# Patient Record
Sex: Female | Born: 1935 | Race: White | Hispanic: No | Smoking: Former smoker
Health system: Southern US, Community
[De-identification: ages and names within clinical notes are randomized; demographics above are authoritative.]

## PROBLEM LIST (undated history)

## (undated) DIAGNOSIS — Z9289 Personal history of other medical treatment: Secondary | ICD-10-CM

## (undated) DIAGNOSIS — I2510 Atherosclerotic heart disease of native coronary artery without angina pectoris: Secondary | ICD-10-CM

## (undated) DIAGNOSIS — I358 Other nonrheumatic aortic valve disorders: Secondary | ICD-10-CM

## (undated) DIAGNOSIS — B029 Zoster without complications: Secondary | ICD-10-CM

## (undated) DIAGNOSIS — K219 Gastro-esophageal reflux disease without esophagitis: Secondary | ICD-10-CM

## (undated) DIAGNOSIS — M722 Plantar fascial fibromatosis: Secondary | ICD-10-CM

## (undated) DIAGNOSIS — I10 Essential (primary) hypertension: Secondary | ICD-10-CM

## (undated) DIAGNOSIS — C801 Malignant (primary) neoplasm, unspecified: Secondary | ICD-10-CM

## (undated) DIAGNOSIS — N938 Other specified abnormal uterine and vaginal bleeding: Secondary | ICD-10-CM

## (undated) DIAGNOSIS — E7800 Pure hypercholesterolemia, unspecified: Secondary | ICD-10-CM

## (undated) HISTORY — PX: EYE SURGERY: SHX253

## (undated) HISTORY — DX: Pure hypercholesterolemia, unspecified: E78.00

## (undated) HISTORY — PX: CARPAL TUNNEL RELEASE: SHX101

## (undated) HISTORY — DX: Personal history of other medical treatment: Z92.89

## (undated) HISTORY — DX: Other nonrheumatic aortic valve disorders: I35.8

## (undated) HISTORY — DX: Other specified abnormal uterine and vaginal bleeding: N93.8

## (undated) HISTORY — DX: Malignant (primary) neoplasm, unspecified: C80.1

## (undated) HISTORY — PX: INGUINAL HERNIA REPAIR: SUR1180

## (undated) HISTORY — DX: Zoster without complications: B02.9

---

## 1999-01-08 ENCOUNTER — Encounter: Admission: RE | Admit: 1999-01-08 | Discharge: 1999-01-08 | Payer: Self-pay | Admitting: Endocrinology

## 1999-01-08 ENCOUNTER — Encounter: Payer: Self-pay | Admitting: Endocrinology

## 1999-03-16 HISTORY — PX: VAGINAL HYSTERECTOMY: SUR661

## 1999-03-16 HISTORY — PX: OOPHORECTOMY: SHX86

## 1999-11-02 ENCOUNTER — Encounter: Payer: Self-pay | Admitting: Endocrinology

## 1999-11-02 ENCOUNTER — Encounter: Admission: RE | Admit: 1999-11-02 | Discharge: 1999-11-02 | Payer: Self-pay | Admitting: Endocrinology

## 1999-12-24 ENCOUNTER — Emergency Department (HOSPITAL_COMMUNITY): Admission: EM | Admit: 1999-12-24 | Discharge: 1999-12-24 | Payer: Self-pay | Admitting: Emergency Medicine

## 1999-12-25 ENCOUNTER — Other Ambulatory Visit: Admission: RE | Admit: 1999-12-25 | Discharge: 1999-12-25 | Payer: Self-pay | Admitting: Obstetrics and Gynecology

## 1999-12-25 ENCOUNTER — Encounter (INDEPENDENT_AMBULATORY_CARE_PROVIDER_SITE_OTHER): Payer: Self-pay

## 2000-01-06 ENCOUNTER — Inpatient Hospital Stay (HOSPITAL_COMMUNITY): Admission: RE | Admit: 2000-01-06 | Discharge: 2000-01-08 | Payer: Self-pay | Admitting: Obstetrics and Gynecology

## 2000-01-06 ENCOUNTER — Encounter (INDEPENDENT_AMBULATORY_CARE_PROVIDER_SITE_OTHER): Payer: Self-pay | Admitting: Specialist

## 2000-01-29 ENCOUNTER — Encounter: Admission: RE | Admit: 2000-01-29 | Discharge: 2000-01-29 | Payer: Self-pay | Admitting: Endocrinology

## 2000-01-29 ENCOUNTER — Encounter: Payer: Self-pay | Admitting: Endocrinology

## 2000-02-24 ENCOUNTER — Encounter (INDEPENDENT_AMBULATORY_CARE_PROVIDER_SITE_OTHER): Payer: Self-pay | Admitting: Specialist

## 2000-02-24 ENCOUNTER — Ambulatory Visit (HOSPITAL_COMMUNITY): Admission: RE | Admit: 2000-02-24 | Discharge: 2000-02-25 | Payer: Self-pay | Admitting: *Deleted

## 2000-04-07 ENCOUNTER — Ambulatory Visit (HOSPITAL_BASED_OUTPATIENT_CLINIC_OR_DEPARTMENT_OTHER): Admission: RE | Admit: 2000-04-07 | Discharge: 2000-04-07 | Payer: Self-pay | Admitting: *Deleted

## 2001-03-28 ENCOUNTER — Encounter: Admission: RE | Admit: 2001-03-28 | Discharge: 2001-03-28 | Payer: Self-pay | Admitting: Obstetrics and Gynecology

## 2001-03-28 ENCOUNTER — Encounter: Payer: Self-pay | Admitting: Obstetrics and Gynecology

## 2001-03-29 ENCOUNTER — Ambulatory Visit (HOSPITAL_COMMUNITY): Admission: RE | Admit: 2001-03-29 | Discharge: 2001-03-29 | Payer: Self-pay | Admitting: Endocrinology

## 2001-07-28 ENCOUNTER — Ambulatory Visit (HOSPITAL_COMMUNITY): Admission: RE | Admit: 2001-07-28 | Discharge: 2001-07-28 | Payer: Self-pay | Admitting: Gastroenterology

## 2001-07-28 ENCOUNTER — Encounter (INDEPENDENT_AMBULATORY_CARE_PROVIDER_SITE_OTHER): Payer: Self-pay | Admitting: *Deleted

## 2002-05-04 ENCOUNTER — Ambulatory Visit (HOSPITAL_COMMUNITY): Admission: RE | Admit: 2002-05-04 | Discharge: 2002-05-04 | Payer: Self-pay | Admitting: Ophthalmology

## 2002-06-05 ENCOUNTER — Ambulatory Visit (HOSPITAL_COMMUNITY): Admission: RE | Admit: 2002-06-05 | Discharge: 2002-06-05 | Payer: Self-pay | Admitting: Ophthalmology

## 2002-07-19 ENCOUNTER — Encounter: Admission: RE | Admit: 2002-07-19 | Discharge: 2002-07-19 | Payer: Self-pay | Admitting: Obstetrics and Gynecology

## 2002-07-19 ENCOUNTER — Encounter: Payer: Self-pay | Admitting: Obstetrics and Gynecology

## 2003-08-26 ENCOUNTER — Encounter: Admission: RE | Admit: 2003-08-26 | Discharge: 2003-08-26 | Payer: Self-pay | Admitting: Obstetrics and Gynecology

## 2003-12-03 ENCOUNTER — Ambulatory Visit (HOSPITAL_COMMUNITY): Admission: RE | Admit: 2003-12-03 | Discharge: 2003-12-03 | Payer: Self-pay | Admitting: Endocrinology

## 2004-08-17 ENCOUNTER — Ambulatory Visit (HOSPITAL_COMMUNITY): Admission: RE | Admit: 2004-08-17 | Discharge: 2004-08-17 | Payer: Self-pay | Admitting: Endocrinology

## 2004-08-27 ENCOUNTER — Encounter: Admission: RE | Admit: 2004-08-27 | Discharge: 2004-08-27 | Payer: Self-pay | Admitting: Obstetrics and Gynecology

## 2005-05-25 ENCOUNTER — Encounter: Admission: RE | Admit: 2005-05-25 | Discharge: 2005-05-25 | Payer: Self-pay | Admitting: Orthopedic Surgery

## 2005-05-26 ENCOUNTER — Encounter (INDEPENDENT_AMBULATORY_CARE_PROVIDER_SITE_OTHER): Payer: Self-pay | Admitting: Specialist

## 2005-05-26 ENCOUNTER — Ambulatory Visit (HOSPITAL_BASED_OUTPATIENT_CLINIC_OR_DEPARTMENT_OTHER): Admission: RE | Admit: 2005-05-26 | Discharge: 2005-05-26 | Payer: Self-pay | Admitting: Orthopedic Surgery

## 2005-09-01 ENCOUNTER — Encounter: Admission: RE | Admit: 2005-09-01 | Discharge: 2005-09-01 | Payer: Self-pay | Admitting: Obstetrics and Gynecology

## 2006-03-15 HISTORY — PX: OTHER SURGICAL HISTORY: SHX169

## 2006-09-06 ENCOUNTER — Encounter: Admission: RE | Admit: 2006-09-06 | Discharge: 2006-09-06 | Payer: Self-pay | Admitting: Anesthesiology

## 2006-11-11 ENCOUNTER — Inpatient Hospital Stay (HOSPITAL_COMMUNITY): Admission: AD | Admit: 2006-11-11 | Discharge: 2006-11-12 | Payer: Self-pay | Admitting: Cardiology

## 2006-11-24 ENCOUNTER — Encounter (HOSPITAL_COMMUNITY): Admission: RE | Admit: 2006-11-24 | Discharge: 2007-02-22 | Payer: Self-pay | Admitting: Cardiology

## 2007-02-23 ENCOUNTER — Encounter (HOSPITAL_COMMUNITY): Admission: RE | Admit: 2007-02-23 | Discharge: 2007-03-15 | Payer: Self-pay | Admitting: Cardiology

## 2007-03-16 HISTORY — PX: OTHER SURGICAL HISTORY: SHX169

## 2007-05-29 ENCOUNTER — Other Ambulatory Visit: Admission: RE | Admit: 2007-05-29 | Discharge: 2007-05-29 | Payer: Self-pay | Admitting: Obstetrics and Gynecology

## 2007-09-20 ENCOUNTER — Encounter: Admission: RE | Admit: 2007-09-20 | Discharge: 2007-09-20 | Payer: Self-pay | Admitting: Obstetrics and Gynecology

## 2007-12-01 ENCOUNTER — Encounter: Admission: RE | Admit: 2007-12-01 | Discharge: 2007-12-01 | Payer: Self-pay | Admitting: Orthopedic Surgery

## 2007-12-05 ENCOUNTER — Ambulatory Visit (HOSPITAL_BASED_OUTPATIENT_CLINIC_OR_DEPARTMENT_OTHER): Admission: RE | Admit: 2007-12-05 | Discharge: 2007-12-05 | Payer: Self-pay | Admitting: Orthopedic Surgery

## 2008-05-29 ENCOUNTER — Other Ambulatory Visit: Admission: RE | Admit: 2008-05-29 | Discharge: 2008-05-29 | Payer: Self-pay | Admitting: Obstetrics and Gynecology

## 2008-05-29 ENCOUNTER — Ambulatory Visit: Payer: Self-pay | Admitting: Obstetrics and Gynecology

## 2008-05-29 ENCOUNTER — Encounter: Payer: Self-pay | Admitting: Obstetrics and Gynecology

## 2009-03-15 HISTORY — PX: APPENDECTOMY: SHX54

## 2009-05-30 ENCOUNTER — Other Ambulatory Visit: Admission: RE | Admit: 2009-05-30 | Discharge: 2009-05-30 | Payer: Self-pay | Admitting: Obstetrics and Gynecology

## 2009-05-30 ENCOUNTER — Ambulatory Visit: Payer: Self-pay | Admitting: Obstetrics and Gynecology

## 2009-06-16 ENCOUNTER — Encounter: Admission: RE | Admit: 2009-06-16 | Discharge: 2009-06-16 | Payer: Self-pay | Admitting: Obstetrics and Gynecology

## 2010-02-09 ENCOUNTER — Encounter (INDEPENDENT_AMBULATORY_CARE_PROVIDER_SITE_OTHER): Payer: Self-pay | Admitting: General Surgery

## 2010-02-09 ENCOUNTER — Inpatient Hospital Stay (HOSPITAL_COMMUNITY): Admission: EM | Admit: 2010-02-09 | Discharge: 2010-02-10 | Payer: Self-pay | Admitting: General Surgery

## 2010-02-09 ENCOUNTER — Encounter: Payer: Self-pay | Admitting: Emergency Medicine

## 2010-02-09 ENCOUNTER — Ambulatory Visit: Payer: Self-pay | Admitting: Diagnostic Radiology

## 2010-05-18 ENCOUNTER — Other Ambulatory Visit: Payer: Self-pay | Admitting: Obstetrics and Gynecology

## 2010-05-18 DIAGNOSIS — Z139 Encounter for screening, unspecified: Secondary | ICD-10-CM

## 2010-05-27 LAB — DIFFERENTIAL
Eosinophils Absolute: 0.2 10*3/uL (ref 0.0–0.7)
Eosinophils Relative: 2 % (ref 0–5)
Lymphocytes Relative: 11 % — ABNORMAL LOW (ref 12–46)
Lymphs Abs: 1.4 10*3/uL (ref 0.7–4.0)
Monocytes Relative: 7 % (ref 3–12)
Neutro Abs: 10.1 10*3/uL — ABNORMAL HIGH (ref 1.7–7.7)

## 2010-05-27 LAB — LIPASE, BLOOD: Lipase: 139 U/L (ref 23–300)

## 2010-05-27 LAB — COMPREHENSIVE METABOLIC PANEL
ALT: 19 U/L (ref 0–35)
BUN: 16 mg/dL (ref 6–23)
Chloride: 103 mEq/L (ref 96–112)
GFR calc non Af Amer: >60 mL/min (ref >60)
Sodium: 143 mEq/L (ref 135–145)
Total Protein: 7 g/dL (ref 6.0–8.3)

## 2010-05-27 LAB — URINALYSIS, ROUTINE W REFLEX MICROSCOPIC
Bilirubin Urine: NEGATIVE
Hgb urine dipstick: NEGATIVE
Nitrite: NEGATIVE
pH: 5.5 (ref 5.0–8.0)

## 2010-05-27 LAB — CBC
HCT: 35.8 % — ABNORMAL LOW (ref 36.0–46.0)
Hemoglobin: 12.1 g/dL (ref 12.0–15.0)
Platelets: 188 10*3/uL (ref 150–400)
RDW: 13.4 % (ref 11.5–15.5)
WBC: 12.6 10*3/uL — ABNORMAL HIGH (ref 4.0–10.5)

## 2010-06-01 ENCOUNTER — Encounter (INDEPENDENT_AMBULATORY_CARE_PROVIDER_SITE_OTHER): Payer: Medicare Other | Admitting: Obstetrics and Gynecology

## 2010-06-01 ENCOUNTER — Other Ambulatory Visit: Payer: Self-pay | Admitting: Obstetrics and Gynecology

## 2010-06-01 ENCOUNTER — Other Ambulatory Visit (HOSPITAL_COMMUNITY)
Admission: RE | Admit: 2010-06-01 | Discharge: 2010-06-01 | Disposition: A | Discharge status: Home / Self Care | Payer: Medicare Other | Source: Ambulatory Visit | Attending: Obstetrics and Gynecology | Admitting: Obstetrics and Gynecology

## 2010-06-01 DIAGNOSIS — N951 Menopausal and female climacteric states: Secondary | ICD-10-CM

## 2010-06-01 DIAGNOSIS — K644 Residual hemorrhoidal skin tags: Secondary | ICD-10-CM

## 2010-06-01 DIAGNOSIS — Z9189 Other specified personal risk factors, not elsewhere classified: Secondary | ICD-10-CM | POA: Insufficient documentation

## 2010-06-01 DIAGNOSIS — C549 Malignant neoplasm of corpus uteri, unspecified: Secondary | ICD-10-CM

## 2010-06-22 ENCOUNTER — Ambulatory Visit (HOSPITAL_BASED_OUTPATIENT_CLINIC_OR_DEPARTMENT_OTHER): Payer: Self-pay

## 2010-06-29 ENCOUNTER — Ambulatory Visit (HOSPITAL_BASED_OUTPATIENT_CLINIC_OR_DEPARTMENT_OTHER)
Admission: RE | Admit: 2010-06-29 | Discharge: 2010-06-29 | Disposition: A | Discharge status: Home / Self Care | Payer: Medicare Other | Source: Ambulatory Visit | Attending: Obstetrics and Gynecology | Admitting: Obstetrics and Gynecology

## 2010-06-29 DIAGNOSIS — Z139 Encounter for screening, unspecified: Secondary | ICD-10-CM

## 2010-06-29 DIAGNOSIS — Z1231 Encounter for screening mammogram for malignant neoplasm of breast: Secondary | ICD-10-CM

## 2010-07-28 NOTE — Op Note (Signed)
NAMEJANNETTE, [name]                   ACCOUNT NO.:  [id number]   MEDICAL RECORD NO.:  [id number]          PATIENT TYPE:  AMB   LOCATION:  DSC                          FACILITY:  [facility]   PHYSICIAN:  Cindee Salt, M.D.       DATE OF BIRTH:  September 19, 1926   DATE OF PROCEDURE:  12/05/2007  DATE OF DISCHARGE:                               OPERATIVE REPORT   PREOPERATIVE DIAGNOSIS:  Ulnar neuropathy, right elbow and wrist.   POSTOPERATIVE DIAGNOSIS:  Ulnar neuropathy, right elbow and wrist.   OPERATION:  Decompression of ulnar nerve, right elbow and right wrist.   SURGEON:  Cindee Salt, MD   ASSISTANTCarolyne Fiscal RN   ANESTHESIA:  General.   ANESTHESIOLOGIST:  Burna Forts, MD   HISTORY:  The patient is a 77 year old female with a history of ulnar  neuropathy; numbness and tingling, ring, little finger; EMG nerve  conductions positive with compression of both the ulnar nerve at her  wrist through Guyon canal, and at her elbow through the cubital tunnel.  This has not responded to conservative treatment.  She has elected to  undergo surgical decompression.  Pre, peri, and postoperative course  have been discussed along with risks and complications.  She is aware  that there is no guarantee with the surgery, possibility of infection,  recurrence of injury to arteries, nerves, tendons, complete relief of  symptoms, and dystrophy.  In the preoperative area, the patient is seen,  the extremity marked by both the patient and surgeon.  Antibiotic given.  Questions again reinforced.   PROCEDURE:  The patient was brought to the operating room where general  anesthetic was carried out under the direction of Dr. Jacklynn Bue.  She was  prepped using DuraPrep, supine position with the right arm free.  A time-  out was taken.  The extremity was marked by both the patient and by the  surgeon for incisions at the wrist using the old incision that was used  for carpal tunnel release in the past.  This was  carried down through  subcutaneous tissue after exsanguinating limb with an Esmarch bandage  and inflation of tourniquet placed on the arm at 250 mmHg.  The ulnar  nerve and artery were identified proximally.  This was then traced  through Guyon canal.  The hypothenar musculature was then dissected from  the hamate hook and the deep motor branch of the ulnar nerve was  identified and this was traced distally.  No further lesions were  identified.  The entire nerve was decompressed.  The wound was copiously  irrigated with saline.  The skin was then closed with interrupted 5-0  Vicryl Rapide sutures.  A longitudinal incision was then made over the  medial epicondyle, approximately 2.5-cm in length, carried down through  the subcutaneous tissue.  The posterior branch of medial antebrachial  cutaneous nerve of the forearm was identified.  Retractors were placed.  The Osborne fascia was incised.  The ulnar nerve was identified.  Sewall  retractor was placed proximally and a release performed proximally to  the extent of the [facility] retractor.  The fascia over the flexor carpi  ulnaris was then released distally.  The deep fascia was also released  after placement of retractors to identify the nerve.  The arm was placed  through full flexion and full extension.  No subluxation was noted.  The  wound was irrigated and skin closed with interrupted 5-0 Vicryl Rapide  after closure of the subcutaneous tissue with interrupted 4-0 Vicryl.  Sterile compressive dressing splint to the wrist and elbow applied.  The patient tolerated the procedure well.  Deflation of the tourniquet,  all fingers immediately pinked.  She was taken to the recovery room for  observation in satisfactory condition.  She will be discharged to home  to return to the [facility] in 1 week on Vicodin.           ______________________________  Cindee Salt, M.D.     GK/MEDQ  D:  12/05/2007  T:  12/06/2007  Job:   [id number]   cc:   Dr. Clelia Croft

## 2010-07-28 NOTE — Cardiovascular Report (Signed)
[name], [name]                   ACCOUNT NO.:  [id number]   MEDICAL RECORD NO.:  [id number]          PATIENT TYPE:  OIB   LOCATION:  2807                         FACILITY:  [facility]   PHYSICIAN:  Francisca December, M.D.  DATE OF BIRTH:  May 28, 1926   DATE OF PROCEDURE:  11/11/2006  DATE OF DISCHARGE:                            CARDIAC CATHETERIZATION   PROCEDURE PERFORMED:  Percutaneous coronary intervention.   PROCEDURES PERFORMED:  1. PCI/drug-eluting stent implantation, proximal right coronary      artery.  2. PCI, cutting balloon dilatation distal RCA-PLB.   INDICATIONS:  The patient is a 77 year old woman who is developed a  rather typical anginal syndrome.  She does have mild symptoms at rest  including an awareness in the chest.  Myocardial perfusion study  showed a reversible defect in the inferior wall.  Coronary angiography  has revealed a subtotal stenosis of the proximal portion of the right  coronary as well as a 70% to 80% stenosis at the distal right coronary  at the origin of the PLB and PDA.  She is brought to catheterization  laboratory at this time for definitive percutaneous treatment.   PROCEDURE NOTE:  The patient was brought to the cardiac catheterization  laboratory in a fasting state.  The right groin was prepped and draped  in the usual sterile fashion.  Local anesthesia was obtained with  infiltration of 1% lidocaine.  A 6-French catheter sheath was inserted  percutaneously into the right femoral artery utilizing an anterior  approach over guiding J-wire.  The patient then received 0.75 mg/kg  bolus of bivalirudin followed by 1.75 mg/kg per hour constant infusion.  Resultant ACT was 344 seconds.  A 6-French #4 right Judkins guiding  catheter was then advanced to the ascending aorta where the right  coronary os was engaged.  A 0.014 inches Luge intracoronary guidewire  was passed across the proximal lesion without difficulty.  Initial  balloon dilatation was  performed with the 3.0/20 mm Scimed Maverick  intracoronary balloon.  This was inflated to 6 atmospheres for 27  seconds.  It was then deflated and removed and a 3.0/24 mm Medtronic  Endeavor drug-eluting intracoronary stent was then advanced, placed  carefully into position and deployed at peak pressure of 9 atmospheres  for 26 seconds.  Because the vessel tapered significantly from the very  proximal ostial portion to just beyond the stented segment, additional  balloon dilatation was performed in the more mid and proximal portions  of the stented segment.  This was accomplished initially with a 3.0/15  mm Scimed Quantum Maverick intracoronary balloon.  This was positioned  carefully and inflated to a peak pressure of 16 atmospheres for 62  seconds.  This balloon was deflated, removed and a 4.0/8 mm Quantum  Maverick was advanced into the very proximal portion of the stented  segment and inflated to 16 atmospheres for 41 seconds.  These maneuvers  resulted in wide patency and excellent angiographic result.  The 4.0/8  mm Quantum balloon was then removed and a 3.0/10 mm cutting balloon was  advanced into  the distal segment of the right coronary covering the  ostium of the posterior descending artery and extending into the  posterolateral branch.  Previously a second 0.014 inch Luge guide wire  was advanced into the posterior descending artery for a dual wire  technique.  The cutting balloon was initially inflated to 6 atmospheres  for 180 seconds.  It was withdrawn.  Cineangiography performed and  subsequently re-advanced for a second dilatation performed a 8  atmospheres for 123 seconds.  These maneuvers resulted in wide patency  and excellent angiographic result in the distal segment of the artery.  Therefore, following confirmation of adequate patency in orthogonal  views, the guidewires were removed.  Additional cineangiography was  performed in the LAO projection.  The guiding  catheter was then removed.  A right femoral arteriogram in the 45 degrees RAO angulation via the  catheter sheath by hand injection documented adequate anatomy for  placement of the percutaneous closure device, Angio-Seal.  This was  subsequent deployed with good hemostasis and intact distal pulse.   ANGIOGRAPHY:  As mentioned, the initial lesion treated was in the  proximal portion of right coronary.  It was a diffuse lesion and there  was a more proximal tubular 50% stenosis followed about 12 mm later by a  subtotal 99% stenosis.  The ostial/most proximal portion of the lesion  was 4.0 mm.  The distal was about 2.5 to 3 mm.  Following balloon  dilatation and stent implantation, post stent dilatation, there was no  residual stenosis seen in the proximal segment.   The more distal lesion was rather focal and did not extend into the  origin of posterior descending artery.  Following cutting balloon  dilatation as described above, there was a 10% stenosis.  It previously  had been 80%.   FINAL IMPRESSION:  1. Atherosclerotic cardiovascular disease, single vessel.  2. Status post successful PCI/drug-eluting stent implantation proximal      RCA.  3. Status post successful PCI/cutting balloon dilatation distal RCA.  4. Typical angina was not reproduced with device insertion or balloon      inflation      Francisca December, M.D.  Electronically Signed     JHE/MEDQ  D:  11/11/2006  T:  11/13/2006  Job:  [id number]

## 2010-07-31 NOTE — Op Note (Signed)
NAMELUCRESIA, SIMIC                   ACCOUNT NO.:  [id number]   MEDICAL RECORD NO.:  [id number]          PATIENT TYPE:  AMB   LOCATION:  DSC                          FACILITY:  [facility]   PHYSICIAN:  Cindee Salt, M.D.       DATE OF BIRTH:  1926-10-12   DATE OF PROCEDURE:  05/26/2005  DATE OF DISCHARGE:                                 OPERATIVE REPORT   PREOPERATIVE DIAGNOSIS:  Carpal tunnel syndrome, right hand.   POSTOPERATIVE DIAGNOSIS:  Carpal tunnel syndrome, right hand.   OPERATION:  Carpal tunnel release, tenosynovectomy flexor tendons, right  hand.   SURGEON:  Cindee Salt, M.D.   ASSISTANTCarolyne Fiscal, R.N.   ANESTHESIA:  Forearm based IV regional.   HISTORY:  The patient is a 77 year old female with a long history of carpal  tunnel syndrome.  EMG nerve conductions positive, unresponsive to  conservative treatment.  She is brought to the operating room for carpal  tunnel release.   DESCRIPTION OF PROCEDURE:  The patient is brought to the operating room  after questions answered.  The arm was marked by both the patient and  surgeon and surgery discussed.  A forearm based IV regional anesthetic was  carried out without difficulty.  She was prepped using DuraPrep, supine  position, right arm free.  A longitudinal incision was made in the palm and  carried down through subcutaneous tissue.  Bleeders were electrocauterized.  Palmar fascia was split.  Superficial palmar arch identified.  A very  significant tenosynovitis was present.  Significant fluid was extruded on  opening the tenosynovial and carpal tunnel.  An hour glass deformity with  significant hyperemia was present to the median nerve.  The retractors were  placed to the ulnar side of the median nerve.  The carpal retinaculum was  incised.  A second fluid-filled area was entered.  It was decided to proceed  with a tenosynovectomy due to the amount of tenosynovial tissue, fraying,  fibrillation and exuberant tenosynovial  being present.  The wound was  extended proximally to the ulnar side of the wrist and down to the forearm.  Significant swelling in the tenosynovial tissue was noted across the entire  course of the carpal canal.  The median nerve was identified and protected.  Retractors placed.  Tenosynovectomy was then performed to the superficialis  and profundus tendons.  A large amount of tenosynovial tissue was sent.  Some fraying of the profundus tendons were present but no ruptures were  present.  The specimen was sent to pathology.  The wound was then copiously  irrigated with saline.  The nerve was entirely intact along its course. The  skin was then closed interrupted 5-0 nylon sutures.  Sterile compressive dressing and splint were applied.  The patient tolerated  the procedure well and was taken to the recovery room for observation in  satisfactory condition.  She will be discharged home to return to the [facility] [place] on Monday on Vicodin, to began early active motion.           ______________________________  Cindee Salt, M.D.     GK/MEDQ  D:  05/26/2005  T:  05/27/2005  Job:  [id number]   cc:   Alfonse Alpers. Dagoberto Ligas, M.D.  Fax: [phone]

## 2010-07-31 NOTE — H&P (Signed)
[facility]  Patient:    [name], [name]                            MRN: [id number] Attending:  Katherine Roan, M.D.                         History and Physical  CHIEF COMPLAINT:  Abdominal pain and abnormal uterine bleed.  HISTORY OF PRESENT ILLNESS:  [name] is a 77 year old gravida 2, para 2 female who is on hormone replacement therapy who had a hysteroscopy and endometrial resection in the past for heavy bleeding and continues to have heavy bleeding and now has developed cyclic lower abdominal pain with a palpable knot in the right lower quadrant.  CT scan of the pelvis is normal. This discomfort occurs with menstrual bleeding. Because of this and continued abnormal bleeding, a laparoscopic hysterectomy is recommended. She is a gravida 2, para 2. She is also on Norvasc and atenolol and Zestril for blood pressure control. She has no known allergies.  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 Her mother died at age 55 and her father died at 21. She has 2 cousins with breast cancer. Her brother has hypertension.  REVIEW OF SYSTEMS:  HEENT:  She wears glasses but has no decrease in visual or auditory acuity and no dizziness. No frequent headaches. HEART:  No history of chest pain, no shortness of breath. She has ______ of her hypertension and is adequately controlled on this medicine. LUNGS:  No shortness of breath, no history of chronic cough or asthma. GU:  She has no stress urinary incontinence or frequency of urination. No history of UTIs. GI:  No bowel habit change, no melena, and no anorexia. MUSCLES/BONES/JOINTS:  Negative. No history of fractures or arthritis.  PHYSICAL EXAMINATION:  GENERAL:  Well-developed, well-nourished female in no acute distress who appears to be younger than stated age.  VITAL SIGNS:  Weight 132, blood pressure 130/70.  HEENT:  Unremarkable.  Oropharynx is not injected.  NECK:  Supple. Thyroid is not  enlarged.  BREASTS:  No masses or tenderness.  LUNGS:  Clear to auscultation and percussion.  HEART:  Normal sinus rhythm. No murmurs.  ABDOMEN:  Soft and flat. Liver, spleen or kidneys are not palpated. Bowel sounds are normal. No bruits are heard. Femoral pulses are equal bilaterally.  PELVIC:  Reveals a normal vulva and vagina. Cervix is clean. The uterus is mid plane. Adnexa negative. Rectovaginal confirms. Hemoccult is negative.  IMPRESSION:  Continued abnormal uterine bleeding and cyclic abdominal pain. CA 125 and CEA are normal.  PLAN:  Laparoscopically assisted vaginal hysterectomy, bilateral salpingo-oophorectomy. Risks, benefits and detailed informed consent has been given to this patient. DD:  01/05/00 TD:  01/05/00 Job: [id number]

## 2010-07-31 NOTE — Op Note (Signed)
[facility]  Patient:    [name], [name]                      MRN: [id number] Proc. Date: 04/07/00 Adm. Date:  [id number] Attending:  Meredith Leeds                           Operative Report  PREOPERATIVE DIAGNOSIS:  Recurrent right inguinal hernia.  POSTOPERATIVE DIAGNOSIS:  Wound seroma.  OPERATION PERFORMED:  Right groin exploration.  SURGEON:  Stephenie Acres, M.D.  ANESTHESIA:  General.  DESCRIPTION OF PROCEDURE:  The patient was taken to the operating room and placed in supine position.  After adequate anesthesia was induced using laryngeal mask, the right groin was prepped and draped in normal sterile fashion.  In the previous right inguinal incision, I dissected down to subcutaneous tissues onto the external oblique fascia which was very then and attenuated.  This was opened.  Upon opening it, approximately 100 cc of serous fluid was removed.  The remainder of the external oblique fascia was opened. The entire onlay mesh was then tacked with Prolene tacking sutures in the periphery.  There was no evidence of recurrent hernia.  There the external oblique fascia was closed with a running 3-0 Vicryl.  Skin was closed with staples.  All tissues were injected using Marcaine.   The patient tolerated the procedure well and went to PACU in good condition. DD:  04/07/00 TD:  04/07/00 Job: [id number]

## 2010-07-31 NOTE — Op Note (Signed)
NAME:  [name], [name]                            ACCOUNT NO.:  [id number]   MEDICAL RECORD NO.:  [id number]                   PATIENT TYPE:  OIB   LOCATION:                                       FACILITY:  [facility]   PHYSICIAN:  Guadelupe Sabin, M.D.             DATE OF BIRTH:  May 15, 1926   DATE OF PROCEDURE:  05/04/2002  DATE OF DISCHARGE:                                 OPERATIVE REPORT   PREOPERATIVE DIAGNOSIS:  Senile nuclear cataract, right eye.   POSTOPERATIVE DIAGNOSIS:  Senile nuclear cataract, right eye.   PROCEDURE:  Planned extracapsular cataract extraction, phacoemulsification,  primary insertion of posterior chamber intraocular lens implant.   SURGEON:  Guadelupe Sabin, M.D.   ASSISTANT:  Nurse.   ANESTHESIA:  Local 4% Xylocaine, 0.75% Marcaine retrobulbar block with  Wydase added.  Topical Tetracaine.  Intraocular Xylocaine.  Anesthesia  standby required in this 77 year old female.   DESCRIPTION OF PROCEDURE:  After the patient was prepped and draped, a lid  speculum was inserted in the right eye.  The eye was turned downward and a  superior rectus traction suture placed.  Schiotz tonometry was recorded  within safe scale reading units.  A peritomy was performed adjacent to the  limbus from the 11 to 1 o'clock position.  The corneoscleral junction was  cleaned and a corneoscleral groove made with a 45 degree Superblade.  The  anterior chamber was then entered with the 2.5 mm diamond keratome at the 12  o'clock position and a 15 degree blade at the 2:30 position.  Using a bent  26-gauge needle on a Healon syringe, a circular capsulorhexis was begun and  then completed with the Grabow forceps.  Hydrodissection and  hydrodelineation were performed using 1% Xylocaine.  A 30 degree  phacoemulsification tip was then inserted with slow, controlled  emulsification of the lens nucleus.  Total ultrasonic time 1 minute 40  seconds, average power level 18%, total amount of  fluid used 75 mL.  Following removal of the nucleus, the residual cortex was aspirated with the  irrigation-aspiration tip.  The posterior capsule appeared intact with a  brilliant red fundus reflex.  It was therefore elected to insert an [employer]er[employer] SI40NB silicone three-piece posterior chamber intraocular  lens implant, diopter strength +17.00.  This was inserted with the McDonald  forceps into the anterior chamber and then centered into the capsular bag  using the [facility] lens rotator.  The lens appeared to be well-centered.  The  Healon which had been used throughout the procedure was aspirated and  replaced with balanced salt solution and Miochol ophthalmic solution.  The  operative incisions appeared to be self-sealing, and no sutures were  required.  Maxitrol ointment was instilled in the conjunctival cul-de-sac  and a light patch and protective shield applied.  Duration of procedure and  anesthesia administration 45 minutes.  The patient tolerated the procedure  well in general, left the operating room for the recovery room in good  condition.                                               Guadelupe Sabin, M.D.    HNJ/MEDQ  D:  07/22/2002  T:  07/23/2002  Job:  [id number]

## 2010-07-31 NOTE — Discharge Summary (Signed)
[facility]  Patient:    ARNIE, MAIOLO                      MRN: [id number] Adm. Date:  [id number] Disc. Date: 01/08/00 Attending:  Lendon Colonel CC:         Alfonse Alpers. Dagoberto Ligas, M.D.  Veverly Fells. Altheimer, M.D.   Discharge Summary  ADMISSION DIAGNOSIS:  Persistent abnormal uterine bleeding and pelvic pain.  DISCHARGE DIAGNOSES:  Persistent abnormal uterine bleeding and pelvic pain and adenocarcinoma in situ of endometrium.  BRIEF HISTORY:  Ms. Rickles is a 77 year old gravida 2, para 2, female who was admitted for LAVH and BSO for pelvic discomfort and continued abnormal uterine bleeding.  Endometrial biopsy, done about two weeks prior to admission, was benign.  A Pap smear was normal.  Her co-morbidities included only hypertension.  She appeared younger than her stated age.  LABORATORY STUDIES:  Metabolic profile, on admission, including liver function studies was normal.  Coagulation profiles were normal.  Urinalysis was normal. Chest x-ray was normal.  An EKG showed a normal EKG.  HOSPITAL COURSE:  The patient was admitted to the hospital and underwent an uneventful laparoscopic-assisted vaginal hysterectomy and bilateral salpingo-oophorectomy.  Her postoperative course was uncomplicated.  We thought we could discharge her on the first postoperative day, but she was dizzy and her blood pressure was somewhat low and we tweaked her blood pressure and medication and on the day of discharge, her pressure was 130/80. She felt much better and was not nauseated.  She had tolerated a diet as well. She is now ready for discharge to resume blood pressure monitoring at home, continue her antihypertensive, and to return to the office in two weeks.  She will call for fever or bleeding and she will continue her hormone replacement therapy.  CONDITION ON DISCHARGE:  Improved. DD:  01/08/00 TD:  01/08/00 Job: [id number]

## 2010-07-31 NOTE — Op Note (Signed)
[place]  Patient:    [name], [name]                      MRN: [id number] Proc. Date: 02/24/00 Adm. Date:  [id number] Disc. Date: [id number] Attending:  Lendon Colonel                           Operative Report  PREOPERATIVE DIAGNOSIS:  Incarcerated right inguinal hernia.  POSTOPERATIVE DIAGNOSIS:  Incarcerated right inguinal hernia.  PROCEDURE:  Right inguinal hernia repair with mesh.  FINDINGS:  Minimally ischemic appendices epiploica in an incarcerated right inguinal hernia.  SURGEON:  Catalina Lunger, M.D.  ANESTHESIA:  General.  CONSULTANTS:  Alfonse Alpers. Gegick, M.D.  DESCRIPTION OF PROCEDURE:  Patient was taken to the operating room and placed in the supine position.  After adequate anesthesia was induced using an endotracheal tube, the abdomen was prepped and draped in normal sterile fashion.  Using an oblique incision over the right inguinal region, I dissected down under the external oblique fascia.  This was opened along its fibers.  The ilioinguinal nerve was identified and retracted medially.  An incarcerated indirect hernia was identified.  The internal ring was opened medially to release the hernia contents.  They appeared slightly dusky on initial inspection.  The hernia sac was opened.  It was found to be an appendices epiploica which easily returned to normal color on releasing the internal ring.  Digital inspection was made through the internal ring.  No other pathology was noted.  I felt that no bowel was involved and therefore opted to ligate the internal ring.  A mushroom-shaped Ethicon two-layer mesh was then placed in the preperitoneum, the plug brought through the internal ring, and the onlay tacked medially to the pubic tubercle and superiorly to the transversalis fascia, inferiorly to Coopers ligament.  It was brought out laterally and tacked lateral to the internal ring.  Adequate hemostasis  was ensured.  The external oblique fascia was closed with a running 3-0 Vicryl. Skin was closed with staples.  Patient tolerated the procedure well, went to PACU in good condition. DD:  02/24/00 TD:  02/24/00 Job: [id number]

## 2010-07-31 NOTE — Op Note (Signed)
[facility]  Patient:    [name], [name]                          MRN: [id number] Proc. Date: 01/06/00 Adm. Date:  [id number] Disc. Date: [id number] Attending:  Osvaldo Human                           Operative Report  PREOPERATIVE DIAGNOSES:  Pelvic pain, persistent menorrhagia.  POSTOPERATIVE DIAGNOSES:  Pelvic pain, persistent menorrhagia.  OPERATION PERFORMED:  Laparoscopically assisted vaginal hysterectomy and bilateral salpingo-oophorectomy.  DESCRIPTION OF PROCEDURE:  The patient was placed in lithotomy position and prepped and draped in the usual fashion. The cervix was grasped, Hulka elevator was inserted into the cervix, the bladder was catheterized. A transverse incision was made in the abdomen and the Veress needle was inserted into the peritoneal cavity, aspiration infusion technique was utilized to ensure proper placement. About 1.5 liters of CO2 was then infused and the abdomen was entered with the trocar. Visualization of the ovaries found tubes to be normal and the uterus was normal. The liver, spleen and all appeared to be normal with no abnormalities in the small or large bowel. A second puncture was made in each side for a 10 mm trocar and a midline trocar was placed in the lower abdomen for a 5 mm trocar. The infundibulopelvic ligaments on each side were then cauterized with the Seitzinger tripolar forceps, the round ligaments were severed and the peritoneum was then dissected off the lower segment. A bladder flap was created. We released the gas and went down below and completed the vaginal hysterectomy by doing a circumferential incision around the cervix, clamping uterosacral cardinals and uterine vessels and removing the uterus. Both tubes and ovaries appeared to be normal and the uterus was fairly large for a postmenopausal woman. Hemostasis was accomplished quite nicely with #0 Vicryl sutures and #0 chromic sutures. Following  this, I did a uterosacral plication with 2 sutures of #0 Ethibond which supported the vagina quite nicely. Following this, we closed the peritoneum and then the vagina in a running locking fashion with 2-0 PDS. Hemostasis was secured. We went above and irrigated the pelvis, all pedicles were visualized, all trocar sites were visualized and they were found to be stable. Gas was evacuated and the incisions were closed with #0 Vicryl for the deep suture and 3-0 Vicryl for the skin. All 4 incisions were closed nicely and infiltrated with 0.5% Marcaine with epinephrine. Ms. Basulto tolerated this procedure well and was sent to the recovery room in good condition. DD:  01/06/00 TD:  01/06/00 Job: [id number]

## 2010-07-31 NOTE — Op Note (Signed)
[name], [name]                             ACCOUNT NO.:  [id number]   MEDICAL RECORD NO.:  [id number]                   PATIENT TYPE:  OIB   LOCATION:  2895                                 FACILITY:  [facility]   PHYSICIAN:  Guadelupe Sabin, M.D.             DATE OF BIRTH:  [birth year]   DATE OF PROCEDURE:  06/05/2002  DATE OF DISCHARGE:  06/05/2002                                 OPERATIVE REPORT   PREOPERATIVE DIAGNOSIS:  Senile cataract, left eye.   POSTOPERATIVE DIAGNOSIS:  Senile cataract, left eye.   OPERATION:  Planned extracapsular cataract extraction --  phacoemulsification, primary insertion of posterior chamber interocular lens  implant.   SURGEON:  Guadelupe Sabin, M.D.   ASSISTANT:  Nurse.   ANESTHESIA:  Local 4% Xylocaine, 0.75% Marcaine.  Anesthesia standby  required.  The patient was given sodium Pentothal intravenously during the  period of retrobulbar injection.   DESCRIPTION OF PROCEDURE:  After the patient was prepped and draped, a lid  speculum was inserted in the left eye.  The eye was turned downward, and a  superior rectus traction suture was placed.  Schiotz tonometry was recorded  at 7 to 8 scale units with a 5.5 g weight.  A peritomy was performed  adjacent to the limbus from the 11 to 1 o'clock position.  The corneoscleral  junction was cleaned, and the corneoscleral groove made with a 45-degree  Superblade.  The anterior chamber was then entered with a 2.5 mm diamond  keratome at the 12 o'clock position and the 15-degree blade at the 2:30  position.  Using a bent 26-guage needle on a Healon syringe, a circular  capsulorrhexis was begun and then completed with the Grabow forceps.  Hydrodissection and hydrodelineation were performed using 1% Xylocaine.  The  30-degree phacoemulsification tip was then inserted with slow, controlled  emulsification of the lens nucleus.  Total ultrasonic time: 53 seconds.  Average power level: 18%. Total amount of  fluid used: 80 ml.  Following  removal of the nucleus, the residual cortex was aspirated with the  irrigation aspiration tip. The posterior capsule appeared intact with a  brilliant red fundus reflex.  It was, therefore, elected to insert an  [employer] [id number] silicone three-piece posterior chamber  interocular lens implant.  Diopter strength: +15.00.  This was inserted with  the McDonald forceps into the anterior chamber and then centered into the  capsular bag using the [facility] lens rotator.  The lens appeared to be well  centered.  The Healon which had been used throughout the procedure was  aspirated and replaced with balanced salt solution and Miochol ophthalmic  solution.  The operative incisions appeared to be self sealing, and no  sutures were required.  Maxitrol ointment was instilled in the conjunctival  cul-de-sac, and a light patch and protector shield applied.  Duration of  procedure and anesthesia administration:  45 minutes.  The patient tolerated  the procedure well in general and left the operating room for the recovery  room in good condition.                                               Guadelupe Sabin, M.D.    HNJ/MEDQ  D:  06/05/2002  T:  06/05/2002  Job:  [id number]

## 2010-07-31 NOTE — H&P (Signed)
[name], [name]                             ACCOUNT NO.:  [id number]   MEDICAL RECORD NO.:  [id number]                   PATIENT TYPE:  OIB   LOCATION:  2895                                 FACILITY:  [facility]   PHYSICIAN:  Guadelupe Sabin, M.D.             DATE OF BIRTH:  [birth year]   DATE OF ADMISSION:  06/05/2002  DATE OF DISCHARGE:  06/05/2002                                HISTORY & PHYSICAL   REASON FOR ADMISSION:  This was a planned outpatient readmission of this 77-  year-old white female admitted for cataract implant surgery of the left eye.   HISTORY OF PRESENT ILLNESS:  This patient has had recent cataract  implantation surgery of the right eye on 05/04/2002.  The patient has done  extremely well following this and has elected to proceed with similar  surgery of the left eye.  Vision has deteriorated to 20/80 in the left eye  compared to the corrected 2/25 in the right eye.  She signed an informed  consent, and arrangements were made for her outpatient admission.   PAST MEDICAL HISTORY:  The patient is in stable general health and has noted  no change in health status.  See old chart for details.   REVIEW OF SYSTEMS:  The patient has no cardiorespiratory complaints.   CURRENT MEDICATIONS:  Tenormin, Zestril, Premarin.   PRIMARY CARE PHYSICIAN:  Alfonse Alpers. Dagoberto Ligas, M.D.   PHYSICAL EXAMINATION:  VITAL SIGNS:  As recorded on admission, blood  pressure 116/55, heart rate 52, respirations 18, temperature 97.1.  GENERAL:  The patient is a pleasant, well nourished, well developed 75-year-  old white female in no acute distress.  HEENT:  Eyes: Visual acuity as noted above.  Applanation tonometry normal,  14 mm each eye.  Slit lamp examination: The eyes are white and clear with a  well centered clear posterior chamber implant in the right eye and a nuclear  cataract in the left eye.  Detailed fundus examination: Vitreous clear,  retina attached.  Optic nerve sharply  outlined.  Good color.  Disk cup ratio  0.4.  The patient has  history of an old epiretinal membrane in the left eye  which may require future retinal surgery.  CHEST:  Lungs clear to percussion and auscultation.  HEART:  Normal sinus rhythm.  No cardiomegaly, no murmur.  ABDOMEN:  Negative.  EXTREMITIES:  Negative.    ADMISSION DIAGNOSES:  1. Senile cataract, left eye.  2. Pseudophakia, right eye.   SURGICAL PLAN:  Cataract implant surgery, left eye at this time.                                                Guadelupe Sabin, M.D.    HNJ/MEDQ  D:  06/05/2002  T:  06/05/2002  Job:  [id number]   cc:   Alfonse Alpers. Dagoberto Ligas, M.D.  [address]  [place]  [place] [address]  Fax: [phone]

## 2010-11-07 ENCOUNTER — Encounter: Payer: Self-pay | Admitting: *Deleted

## 2010-11-07 ENCOUNTER — Emergency Department (HOSPITAL_BASED_OUTPATIENT_CLINIC_OR_DEPARTMENT_OTHER)
Admission: EM | Admit: 2010-11-07 | Discharge: 2010-11-07 | Disposition: A | Discharge status: Home / Self Care | Payer: Medicare Other | Attending: Emergency Medicine | Admitting: Emergency Medicine

## 2010-11-07 ENCOUNTER — Emergency Department (INDEPENDENT_AMBULATORY_CARE_PROVIDER_SITE_OTHER): Payer: Medicare Other

## 2010-11-07 DIAGNOSIS — S2239XA Fracture of one rib, unspecified side, initial encounter for closed fracture: Secondary | ICD-10-CM

## 2010-11-07 DIAGNOSIS — K219 Gastro-esophageal reflux disease without esophagitis: Secondary | ICD-10-CM | POA: Insufficient documentation

## 2010-11-07 DIAGNOSIS — R110 Nausea: Secondary | ICD-10-CM | POA: Insufficient documentation

## 2010-11-07 DIAGNOSIS — J329 Chronic sinusitis, unspecified: Secondary | ICD-10-CM | POA: Insufficient documentation

## 2010-11-07 DIAGNOSIS — I2510 Atherosclerotic heart disease of native coronary artery without angina pectoris: Secondary | ICD-10-CM | POA: Insufficient documentation

## 2010-11-07 DIAGNOSIS — R0789 Other chest pain: Secondary | ICD-10-CM

## 2010-11-07 DIAGNOSIS — R51 Headache: Secondary | ICD-10-CM | POA: Insufficient documentation

## 2010-11-07 DIAGNOSIS — W19XXXA Unspecified fall, initial encounter: Secondary | ICD-10-CM

## 2010-11-07 DIAGNOSIS — I10 Essential (primary) hypertension: Secondary | ICD-10-CM | POA: Insufficient documentation

## 2010-11-07 HISTORY — DX: Atherosclerotic heart disease of native coronary artery without angina pectoris: I25.10

## 2010-11-07 HISTORY — DX: Gastro-esophageal reflux disease without esophagitis: K21.9

## 2010-11-07 HISTORY — DX: Essential (primary) hypertension: I10

## 2010-11-07 LAB — DIFFERENTIAL
Basophils Absolute: 0 10*3/uL (ref 0.0–0.1)
Eosinophils Absolute: 0.2 10*3/uL (ref 0.0–0.7)
Eosinophils Relative: 4 % (ref 0–5)
Monocytes Absolute: 0.7 10*3/uL (ref 0.1–1.0)
Monocytes Relative: 10 % (ref 3–12)
Neutro Abs: 4.6 10*3/uL (ref 1.7–7.7)

## 2010-11-07 LAB — BASIC METABOLIC PANEL
BUN: 10 mg/dL (ref 6–23)
Calcium: 9.2 mg/dL (ref 8.4–10.5)
Creatinine, Ser: 0.7 mg/dL (ref 0.50–1.10)
GFR calc non Af Amer: >60 mL/min (ref >60)

## 2010-11-07 LAB — CBC
HCT: 33.2 % — ABNORMAL LOW (ref 36.0–46.0)
Platelets: 205 10*3/uL (ref 150–400)
RBC: 3.78 MIL/uL — ABNORMAL LOW (ref 3.87–5.11)
RDW: 13 % (ref 11.5–15.5)

## 2010-11-07 MED ORDER — OXYMETAZOLINE HCL 0.05 % NA SOLN
1.0000 | Freq: Once | NASAL | Status: AC
  Administered 2010-11-07: 1 via NASAL
  Filled 2010-11-07: qty 15

## 2010-11-07 MED ORDER — HYDROCODONE-ACETAMINOPHEN 5-500 MG PO TABS: 1.0000 | ORAL_TABLET | Freq: Four times a day (QID) | ORAL | Status: AC | PRN

## 2010-11-07 MED ORDER — ONDANSETRON HCL 8 MG PO TABS
8.0000 mg | ORAL_TABLET | Freq: Once | ORAL | Status: AC
  Administered 2010-11-07: 8 mg via ORAL
  Filled 2010-11-07: qty 1

## 2010-11-07 MED ORDER — ONDANSETRON HCL 4 MG PO TABS
4.0000 mg | ORAL_TABLET | Freq: Four times a day (QID) | ORAL | Status: AC
Start: 2010-11-07 — End: 2010-11-14

## 2010-11-07 MED ORDER — DEXTROSE 5 % IV SOLN
1.0000 g | Freq: Once | INTRAVENOUS | Status: AC
  Administered 2010-11-07: 04:00:00 via INTRAVENOUS
  Filled 2010-11-07: qty 1

## 2010-11-07 MED ORDER — SODIUM CHLORIDE 0.9 % IV BOLUS (SEPSIS)
1000.0000 mL | Freq: Once | INTRAVENOUS | Status: AC
  Administered 2010-11-07: 1000 mL via INTRAVENOUS

## 2010-11-07 NOTE — ED Notes (Signed)
MD at bedside. 

## 2010-11-07 NOTE — ED Notes (Signed)
Pt performed IS 3X.

## 2010-11-07 NOTE — ED Notes (Signed)
Family at bedside. 

## 2010-11-07 NOTE — ED Notes (Signed)
Pt here for c/o headache and nausea since earlier in the week. Pt was started on Amoxicillin on Thursday for sinus infection. Still c/o pressure behind eyes, frontal headache and facial pressure. No neuro deficits noted, no slurred speech.

## 2010-11-07 NOTE — ED Notes (Signed)
Sutures removed from chin per MD order, pt tolerated well.

## 2010-11-07 NOTE — ED Notes (Signed)
Pt return from radiology, NAD noted, IV site unremarkable.

## 2010-11-07 NOTE — ED Notes (Signed)
Pt c/o headache, chills, and nausea. Denies any urinary sxs, no CP or SOB.

## 2010-11-07 NOTE — ED Provider Notes (Signed)
History     CSN: [id number] Arrival date & time: 11/07/2010  2:57 AM  Chief Complaint  Patient presents with  . Nausea  . Chills  . Headache   Patient is a 77 y.o. female presenting with headaches. The history is provided by the patient.  Headache  This is a recurrent problem. The current episode started more than 2 days ago. The problem occurs constantly. The problem has not changed since onset.The headache is associated with nothing. Pain location: maxiallry and fromtal. The quality of the pain is described as dull. The pain is moderate. The pain does not radiate. Associated symptoms include nausea. Pertinent negatives include no fever, no chest pressure, no syncope, no shortness of breath and no vomiting. She has tried acetaminophen for the symptoms. The treatment provided mild relief.  PT has seen her PCP for the same and was started on Amoxicillin for sinus infection, she has had 3 doses but c/o sinus pressure and nausea, now with dec appetite and chills.  She is also worried about L rib s/p fall about 5 days ago at home, no SOB or CP otherwise. At that time she sustained a chin lac and was told the have sutures removed.  She is requesting SR. No wound itrritation/ pain/ swelling/ drainage.  No cough. Some chills, no sore throat.   Past Medical History  Diagnosis Date  . GERD (gastroesophageal reflux disease)   . CAD (coronary artery disease)   . Hypertension     History reviewed. No pertinent past surgical history.  History reviewed. No pertinent family history.  History  Substance Use Topics  . Smoking status: Never Smoker   . Smokeless tobacco: Not on file  . Alcohol Use: No    OB History    Grav Para Term Preterm Abortions TAB SAB Ect Mult Living                  Review of Systems  Constitutional: Negative for fever and chills.  HENT: Negative for neck pain and neck stiffness.   Eyes: Negative for pain.  Respiratory: Negative for cough, chest tightness, shortness  of breath and wheezing.   Cardiovascular: Negative for chest pain and syncope.  Gastrointestinal: Positive for nausea. Negative for vomiting.  Genitourinary: Negative for dysuria.  Musculoskeletal: Negative for back pain.  Skin: Positive for wound. Negative for rash.  Neurological: Positive for headaches.  All other systems reviewed and are negative.    Physical Exam  BP 158/73  Pulse 83  Temp(Src) 98.4 F (36.9 C) (Oral)  Resp 14  Ht 5\' 4"  (1.626 m)  Wt 147 lb (66.679 kg)  BMI 25.23 kg/m2  SpO2 98%  Physical Exam  Constitutional: She is oriented to person, place, and time. She appears well-developed and well-nourished.  HENT:  Head: Normocephalic and atraumatic.       -TTP over bilateral maxillalry sinuses.   - Well healing lac L chin with 4 sutures in place. No dental tenderness, no trismus.    Eyes: Conjunctivae and EOM are normal. Pupils are equal, round, and reactive to light.  Neck: Full passive range of motion without pain. Neck supple. No thyromegaly present.       No meningismus  Cardiovascular: Normal rate, regular rhythm, S1 normal, S2 normal and intact distal pulses.   Pulmonary/Chest: Effort normal and breath sounds normal.       TTP left anterior ribs with ecchymosis L chest and L breast  Abdominal: Soft. Bowel sounds are normal. There is no  tenderness. There is no CVA tenderness.  Musculoskeletal: Normal range of motion.  Neurological: She is alert and oriented to person, place, and time. She has normal strength and normal reflexes. No cranial nerve deficit or sensory deficit. She displays a negative Romberg sign. GCS eye subscore is 4. GCS verbal subscore is 5. GCS motor subscore is 6.       Normal Gait  Skin: Skin is warm and dry. No rash noted. No cyanosis. Nails show no clubbing.  Psychiatric: She has a normal mood and affect. Her speech is normal and behavior is normal.    ED Course  Procedures  MDM Presentation c/w sinusitis. VS WNL/ AF and NAD in  ED. PT given afrin and IVfs for congestion, IV ABX for and zofran for nasuea.  A CXR was obtained to make sure there was no PTX given L chest wall trauma.  Sutures removed for L chin wound. Labs obtained and reviewed.  Results for orders placed during the hospital encounter of 11/07/10 (from the past 24 hour(s))  CBC     Status: Abnormal   Collection Time   11/07/10  3:22 AM      Component Value Range   WBC 6.6  4.0 - 10.5 (K/uL)   RBC 3.78 (*) 3.87 - 5.11 (MIL/uL)   Hemoglobin 11.4 (*) 12.0 - 15.0 (g/dL)   HCT 16.1 (*) 09.6 - 46.0 (%)   MCV 87.8  78.0 - 100.0 (fL)   MCH 30.2  26.0 - 34.0 (pg)   MCHC 34.3  30.0 - 36.0 (g/dL)   RDW 04.5  40.9 - 81.1 (%)   Platelets 205  150 - 400 (K/uL)  DIFFERENTIAL     Status: Normal   Collection Time   11/07/10  3:22 AM      Component Value Range   Neutrophils Relative 70  43 - 77 (%)   Neutro Abs 4.6  1.7 - 7.7 (K/uL)   Lymphocytes Relative 15  12 - 46 (%)   Lymphs Abs 1.0  0.7 - 4.0 (K/uL)   Monocytes Relative 10  3 - 12 (%)   Monocytes Absolute 0.7  0.1 - 1.0 (K/uL)   Eosinophils Relative 4  0 - 5 (%)   Eosinophils Absolute 0.2  0.0 - 0.7 (K/uL)   Basophils Relative 1  0 - 1 (%)   Basophils Absolute 0.0  0.0 - 0.1 (K/uL)  BASIC METABOLIC PANEL     Status: Abnormal   Collection Time   11/07/10  3:22 AM      Component Value Range   Sodium 132 (*) 135 - 145 (mEq/L)   Potassium 4.1  3.5 - 5.1 (mEq/L)   Chloride 96  96 - 112 (mEq/L)   CO2 27  19 - 32 (mEq/L)   Glucose, Bld 118 (*) 70 - 99 (mg/dL)   BUN 10  6 - 23 (mg/dL)   Creatinine, Ser 9.14  0.50 - 1.10 (mg/dL)   Calcium 9.2  8.4 - 78.2 (mg/dL)   GFR calc non Af Amer >60  >60 (mL/min)   GFR calc Af Amer >60  >60 (mL/min)   CXR reviewed.   DEFINITIVE FRACTURE CARE PROVIDED for subacute L rib fracture.  Insentive spirometer, pain medications Rx and pneumonia precautions given and verbalized as understood. At 4:37am feels improved with her congestion, nausea improved. Feels comfortable to  go home.    CXR IMPRESSION: Remote posterior left sixth rib fracture. No displaced acute fracture identified.  No acute cardiopulmonary process.  Original Report Authenticated By: Waneta Martins, M.D.    Sunnie Nielsen, MD 11/07/10 [phone]

## 2010-12-14 LAB — BASIC METABOLIC PANEL
CO2: 30
Chloride: 105
GFR calc Af Amer: >60
Sodium: 139

## 2010-12-25 LAB — CBC
HCT: 32.3 — ABNORMAL LOW
HCT: 34.6 — ABNORMAL LOW
Hemoglobin: 11.6 — ABNORMAL LOW
MCHC: 33.6
MCV: 88.8
MCV: 90.1
Platelets: 230
RBC: 3.84 — ABNORMAL LOW
WBC: 6.3

## 2010-12-25 LAB — BASIC METABOLIC PANEL
BUN: 11
CO2: 27
Chloride: 109
Chloride: 109
GFR calc Af Amer: >60
Glucose, Bld: 95
Potassium: 4
Potassium: 4
Sodium: 141

## 2010-12-25 LAB — PROTIME-INR: INR: 1

## 2011-04-07 DIAGNOSIS — R7989 Other specified abnormal findings of blood chemistry: Secondary | ICD-10-CM | POA: Diagnosis not present

## 2011-04-15 ENCOUNTER — Encounter: Payer: Self-pay | Admitting: Family Medicine

## 2011-04-15 ENCOUNTER — Ambulatory Visit (HOSPITAL_BASED_OUTPATIENT_CLINIC_OR_DEPARTMENT_OTHER)
Admission: RE | Admit: 2011-04-15 | Discharge: 2011-04-15 | Disposition: A | Discharge status: Home / Self Care | Payer: Medicare Other | Source: Ambulatory Visit | Attending: Family Medicine | Admitting: Family Medicine

## 2011-04-15 ENCOUNTER — Ambulatory Visit (INDEPENDENT_AMBULATORY_CARE_PROVIDER_SITE_OTHER): Payer: Medicare Other | Admitting: Family Medicine

## 2011-04-15 VITALS — BP 170/80 | Temp 97.3°F | Ht 64.0 in | Wt 145.0 lb

## 2011-04-15 DIAGNOSIS — M7730 Calcaneal spur, unspecified foot: Secondary | ICD-10-CM | POA: Diagnosis not present

## 2011-04-15 DIAGNOSIS — M79671 Pain in right foot: Secondary | ICD-10-CM

## 2011-04-15 DIAGNOSIS — M79609 Pain in unspecified limb: Secondary | ICD-10-CM

## 2011-04-15 NOTE — Progress Notes (Signed)
  Subjective:    Patient ID: [name], female    DOB: Oct 24, 1926, 78 y.o.   MRN: [id number]  PCP: Dr. Martha Clan  HPI 78 yo F here for right heel pain.  Patient reports over past 2-3 weeks she has had plantar right heel pain without known injury. Then states earlier today when walking on stepping stones she felt a twinge/pop plantar right heel with pain. No known swelling or bruising but she hasn't looked. Difficulty bearing weight since then. Has tried some home stretches for plantar fasciitis which have helped some until today's injury. No taking anything for pain currently.  Past Medical History  Diagnosis Date  . GERD (gastroesophageal reflux disease)   . CAD (coronary artery disease)   . Hypertension     Current Outpatient Prescriptions on File Prior to Visit  Medication Sig Dispense Refill  . aspirin 81 MG tablet Take 81 mg by mouth daily.        . cetirizine (ZYRTEC) 10 MG tablet Take 10 mg by mouth daily.        Marland Kitchen lisinopril (PRINIVIL,ZESTRIL) 20 MG tablet Take 20 mg by mouth daily.        . nebivolol (BYSTOLIC) 2.5 MG tablet Take 2.5 mg by mouth daily.        . simvastatin (ZOCOR) 40 MG tablet Take 40 mg by mouth at bedtime.          Past Surgical History  Procedure Date  . Abdominal hysterectomy   . Inguinal hernia repair   . Carpal tunnel release   . Appendectomy     No Known Allergies  History   Social History  . Marital Status: Married    Spouse Name: N/A    Number of Children: N/A  . Years of Education: N/A   Occupational History  . Not on file.   Social History Main Topics  . Smoking status: Never Smoker   . Smokeless tobacco: Not on file  . Alcohol Use: No  . Drug Use: No  . Sexually Active: Not on file   Other Topics Concern  . Not on file   Social History Narrative  . No narrative on file    Family History  Problem Relation Age of Onset  . Heart attack Mother   . Hypertension Mother   . Heart attack Father   . Diabetes Other    . Hyperlipidemia Other   . Hypertension Other     Temp(Src) 97.3 F (36.3 C) (Oral)  Ht 5\' 4"  (1.626 m)  Wt 145 lb (65.772 kg)  BMI 24.89 kg/m2  Review of Systems See HPI above.    Objective:   Physical Exam Gen: NAD R foot/ankle: Cavus foot. No gross deformity, swelling, bruising. TTP plantar anterior portion of calcaneus at plantar fascia insertion.  No malleolar, base 5th, navicular, other TTP about foot/ankle. FROM ankle without pain - 5/5 strength. Negative thompsons. Negative calcaneal squeeze. NVI distally.   Assessment & Plan:  1. Right heel pain - underlying plantar fasciitis with partial tear this morning.  Calcaneal radiographs done today negative for fracture.  Icing, tylenol as needed for pain.  Avoid stretching exercise for next few days to allow severe pain to improve.  Then start heel raises, stretches on level ground.  Arch straps, heel lifts.  Consider orthotics, formal PT if not improving as expected.

## 2011-04-15 NOTE — Assessment & Plan Note (Signed)
underlying plantar fasciitis with partial tear this morning.  Calcaneal radiographs done today negative for fracture.  Icing, tylenol as needed for pain.  Avoid stretching exercise for next few days to allow severe pain to improve.  Then start heel raises, stretches on level ground.  Arch straps, heel lifts.  Consider orthotics, formal PT if not improving as expected.

## 2011-04-15 NOTE — Patient Instructions (Signed)
You have plantar fasciitis Take tylenol as needed for pain  Take a few days off from your stretches and exercises. Then starting on Monday, do plantar fascia stretch for 20-30 seconds (do 3 of these) in morning Lowering/raise on level ground 3 x 8 once a day then as well. When this is not painful, can do this on a step. Ice bucket 10-15 minutes at end of day Avoid flat shoes/barefoot walking as much as possible. Arch straps have been shown to help with pain. Heel lifts also help with pain by avoiding fully stretching the plantar fascia except when doing home exercises. Formal physical therapy and orthotics with heel lift may be helpful if the above are not helping you over the next few weeks. Steroid injection is a consideration for short term pain relief if you are struggling. Follow up with me in 1 month (can call me sooner if you want to start physical therapy).

## 2011-05-06 ENCOUNTER — Emergency Department (INDEPENDENT_AMBULATORY_CARE_PROVIDER_SITE_OTHER): Payer: Medicare Other

## 2011-05-06 ENCOUNTER — Emergency Department (HOSPITAL_BASED_OUTPATIENT_CLINIC_OR_DEPARTMENT_OTHER)
Admission: EM | Admit: 2011-05-06 | Discharge: 2011-05-06 | Disposition: A | Discharge status: Home / Self Care | Payer: Medicare Other | Attending: Emergency Medicine | Admitting: Emergency Medicine

## 2011-05-06 ENCOUNTER — Encounter (HOSPITAL_BASED_OUTPATIENT_CLINIC_OR_DEPARTMENT_OTHER): Payer: Self-pay

## 2011-05-06 DIAGNOSIS — Z79899 Other long term (current) drug therapy: Secondary | ICD-10-CM | POA: Insufficient documentation

## 2011-05-06 DIAGNOSIS — S838X9A Sprain of other specified parts of unspecified knee, initial encounter: Secondary | ICD-10-CM | POA: Diagnosis not present

## 2011-05-06 DIAGNOSIS — IMO0002 Reserved for concepts with insufficient information to code with codable children: Secondary | ICD-10-CM | POA: Insufficient documentation

## 2011-05-06 DIAGNOSIS — M25569 Pain in unspecified knee: Secondary | ICD-10-CM

## 2011-05-06 DIAGNOSIS — I2510 Atherosclerotic heart disease of native coronary artery without angina pectoris: Secondary | ICD-10-CM | POA: Insufficient documentation

## 2011-05-06 DIAGNOSIS — I10 Essential (primary) hypertension: Secondary | ICD-10-CM | POA: Diagnosis not present

## 2011-05-06 DIAGNOSIS — K219 Gastro-esophageal reflux disease without esophagitis: Secondary | ICD-10-CM | POA: Diagnosis not present

## 2011-05-06 DIAGNOSIS — S86919A Strain of unspecified muscle(s) and tendon(s) at lower leg level, unspecified leg, initial encounter: Secondary | ICD-10-CM

## 2011-05-06 HISTORY — DX: Plantar fascial fibromatosis: M72.2

## 2011-05-06 MED ORDER — ACETAMINOPHEN 325 MG PO TABS
650.0000 mg | ORAL_TABLET | Freq: Once | ORAL | Status: AC
  Administered 2011-05-06: 650 mg via ORAL
  Filled 2011-05-06: qty 2

## 2011-05-06 NOTE — Discharge Instructions (Signed)
Knee Problems, Questions and Answers     Knee problems are common in young people and adults. This document contains general information about several knee problems. It includes:  · Descriptions of the different parts of the knee.   · Diagram of the different parts of the knee.   Individual sections describe specific types of knee injuries and their:   · Symptoms.   · Diagnosis.   · Treatment.   Information on how to prevent these problems is also provided.  WHAT DO THE KNEES DO? HOW DO THEY WORK?  The knees provide stable support for the body. Knees allow the legs to bend and straighten. Flexibility and stability are needed for standing and for motions like:  · Walking.   · Jumping.   · Running.   · Turning.   · Crouching.   Supporting and moving parts help the knees do their job, these parts include:  · Bones.   · Cartilage.   · Muscles.   · Ligaments.   · Tendons.   Any of these parts can be involved in knee pain or a knee not working right (dysfunction).  WHAT CAUSES KNEE PROBLEMS?  There are two general kinds of knee problems: mechanical and inflammatory.  Mechanical Knee Problems are problems that result from:  · Injury, such as a direct blow or sudden movements that strain the knee beyond its normal range of movement.   · Overuse, repetitive motions that produce partial fiber failure in tendon or ligaments.   · Osteoarthritis in the knee, result from wear and tear on its parts.   Inflammatory Knee Problems are inflammation that occurs in certain rheumatic diseases, such as:   · Rheumatoid arthritis.   · Systemic lupus erythematosus.   JOINT BASICS  · The point at which two or more bones are connected is called a joint.   · In all joints, the bones are kept from grinding against each other by the tissue lining the ends of the bones called cartilage.   · Bones are joined to bones by strong, elastic bands of tissue called ligaments.   · Tendons are tough cords of tissue that connect muscle to bone.   · Muscles  work in opposing pairs to bend and straighten joints. While muscles are not technically part of a joint, they are important because strong muscles help support and protect joints.   WHAT ARE THE PARTS OF THE KNEE?  Like any joint, the knee is composed of bones and cartilage, ligaments, tendons, and muscles.  BONES AND CARTILAGE  The knee joint is the junction of four bones:   · The thigh bone or upper leg bone (femur).   · The shin bone or larger bone of the lower leg (tibia).   · The small bone on the outside of the knee where ligaments attach (fibula).   · The knee cap (patella). The patella is 2 to 3 inches wide and 3 to 4 inches long. It sits over the other bones at the front of the knee joint and slides when the leg moves. It protects the knee and gives leverage to muscles.   The ends of the bones in the knee joint are covered with articular cartilage, a tough, elastic material that helps absorb shock and allows the knee joint to move smoothly. Separating the bones of the knee are pads of connective tissue which are called meniscus. The plural is menisci. The menisci are divided into two crescent-shaped discs positioned between the   tibia and femur on the outer and inner sides of each knee. The two menisci in each knee act as shock absorbers, cushioning the lower part of the leg from the weight of the rest of the body as well as enhancing stability.  MUSCLES  There are two groups of muscles at the knee.  · The quadriceps muscle are four muscles on the front of the thigh that work to straighten the leg from a bent position.   · The hamstring muscles, which bend the leg at the knee, run along the back of the thigh from the hip to just below the knee.   Keeping these muscles strong with exercises such as walking up stairs or riding a stationary bicycle helps support and protect the knee.  TENDONS AND LIGAMENTS  · The quadriceps tendon connects the quadriceps muscle to the patella and provides the power to extend  the leg. The patella is a bone within this tendon. Four ligaments connect the femur and tibia and give the joint strength and stability:   · The medial collateral ligament (MCL) provides stability to the inner (medial) part of the knee.   · The lateral collateral ligament (LCL) provides stability to the outer (lateral) part of the knee.   · The anterior cruciate ligament (ACL), in the center of the knee, limits rotation and the forward movement of the tibia.   · The posterior cruciate ligament (PCL), also in the center of the knee, limits backward movement of the tibia.   · Other ligaments are part of the knee capsule. This is the protective, fiber-like structure that wraps around the knee joint. Inside the capsule, the joint is lined with a thin, soft tissue called synovium. This tissue produces the fluid (synovial fluid) which lubricates the joint.   HOW ARE KNEE PROBLEMS DIAGNOSED?  Caregivers use several methods to diagnose knee problems:  · Medical history--The patient tells the caregiver details about:   · Symptoms.   · Injuries.   · Medical conditions.   · Physical examination-- To help the caregiver understand how the knee is working, the patient may be asked to stand, walk or squat. The caregiver, to discover the limits of movement and the location of pain in the knee, may:   · Bend the knee.   · Straighten the knee.   · Rotate (turn) turn the knee.   · Press on the knee to feel for injury.   · Diagnostic tests--The caregiver uses one or more stress tests to determine the nature of a knee problem.   · X-ray (radiography)--An x-ray beam is passed through the knee to produce a two-dimensional picture of the bones.   · Computerized axial tomography (CAT) scan--X-rays are passed through the knee at different angles, detected by a scanner, and analyzed by a computer. This produces a series of clear cross-sectional images ("slices") of the knee tissues on a computer screen. CAT scan images show details of bone  structure, show soft tissues such as ligaments or muscles to a limited degree, can give a three-dimensional view of the knee.   · Bone scan (radionuclide scanning)--A very small amount of radioactive material is injected into the patient's bloodstream and detected by a scanner. This test detects blood flow to the bone and cell activity within the bone and can show abnormalities. This may help the caregiver understand what is wrong.   · Magnetic resonance imaging (MRI)--Energy from a powerful magnet (rather than x-rays) stimulates knee tissue to produce signals.   These signals are detected by a scanner and analyzed by a computer. Like a CAT scan, a computer is used to produce three-dimensional views of the knee during MRI. The MRI provides precise details of ligament, tendon and cartilage structure.   · Arthroscopy--The surgeon manipulates a small, lighted optic tube (arthroscope) that has been inserted into the joint through a small incision in the knee. Images of the inside of the knee joint are projected onto a television screen. While the arthroscope is inside the knee joint, removal of loose pieces of bone or cartilage, or the repair of torn ligaments and menisci can be preformed.   · Biopsy--The caregiver removes tissue to examine under a microscope.   · Aspiration of fluid from the knee--The laboratory will analyze the fluid for cell count, presence of crystals that produce inflammation (as in gout where Uric Acid crystals are the cause of the inflammation) and check for infection.   WHAT IS ARTHRITIS OF THE KNEE?   · Arthritis of the knee is most often osteoarthritis. In this disease, the cartilage in the joint gradually wears away. It may be caused by excess stress on the joint from:   · Trauma.   · Deformity.   · Repeated injury.   · Excess weight.   · It most often affects middle-aged and older people. A young person who develops osteoarthritis may have an inherited form of the disease or may have  experienced continuous irritation from an unrepaired knee injury or other injury.   · In rheumatoid arthritis, which can also affect the knees, the joint becomes inflamed and cartilage may be destroyed. Rheumatoid arthritis often affects people at an earlier age than osteoarthritis and often involves multiple joints.   · Arthritis can also affect supporting structures such as muscles, tendons, and ligaments.   WHAT ARE SIGNS OF ARTHRITIS OF THE KNEE?  · Someone who has arthritis of the knee may experience:   · Pain.   · Swelling/ fluid on the knee.   · A decrease in knee motion.   · A common symptom is morning stiffness. This generally improves as the person moves around.   · Sometimes the joint locks or clicks. These signs may occur in other knee disorders as well.   · The caregiver may confirm the diagnosis by:   · Performing a physical examination.   · Examining x-rays, which typically show a loss of joint space.   · Blood tests may be helpful for diagnosing rheumatoid arthritis, but other tests may be needed.   · Analyzing fluid from the knee joint may be helpful in diagnosing some kinds of arthritis.   · The caregiver may use arthroscopy to directly see damage to cartilage, tendons, and ligaments and to confirm a diagnosis. Arthroscopy is usually done only if a repair procedure is to be performed.   WHAT IS TREATMENT FOR ARTHRITIS OF THE KNEE?  · Most often osteoarthritis of the knee is treated with pain-reducing medicines, such as:   · Nonsteroidal anti-inflammatory drugs (NSAID's)   · Exercises to restore joint movement and strengthen the knee.   · Losing excess weight can also help people with osteoarthritis.   · Rheumatoid arthritis of the knee may require physical therapy and more powerful medicines. In people with severe arthritis of the knee, a seriously damaged joint may need to be replaced with an artificial one.   WHAT IS CHONDROMALACIA?  · Chondromalacia refers to softening of the articular cartilage  of the knee   cap. This disorder occurs most often in young adults. Instead of gliding smoothly across the lower end of the thigh bone, the knee cap rubs against it, thereby roughening the cartilage underneath the knee cap. The damage may range from a slightly abnormal surface of the cartilage to a surface that has been worn away to the bone. It can be caused by:   · Injury.   · Overuse.   · Misalignment of the patellar tendon.   · Muscle weakness (generally the quadriceps).   · Chondromalacia related to injury occurs when a blow to the knee cap tears off either a small piece of cartilage or a large fragment containing a piece of bone.   WHAT ARE SYMPTOMS OF CHONDROMALACIA?  · The most frequent symptom is a dull pain around or under the knee cap. This pain worsens when walking down stairs, or hills or sitting with the knee bent for long periods of time.   · A person may also feel pain when climbing stairs or when the knee bears weight as it straightens.   · The disorder is common in:   · Runners.   · Skiers.   · Cyclists.   · Soccer players.   · A patient's description of symptoms, the physical exam, and a follow-up x-ray usually help the caregiver make a diagnosis.   · Although arthroscopy can confirm the diagnosis. It is not used unless the condition requires extensive treatment.   WHAT IS TREATMENT FOR CHONDROMALACIA?  · Many caregivers recommend that patients with chondromalacia perform low-impact exercises. The knee must not bend more than 90 degrees. This includes:   · Swimming.   · Riding a stationary bicycle.   · Using a cross-country ski machine.   · Electrical stimulation may also be used to strengthen the muscles.   · If these treatments do not improve the condition, the caregiver may perform arthroscopic. Goals of this surgery include smoothing the surface of the cartilage and "washing out" the cartilage fragments that cause the joint to catch during bending and straightening.   · In more severe cases,  surgery may be necessary to:   · Correct the alignment of the knee cap.   · Decrease the pressure on the undersurface of the patella.   · Relieve friction with the cartilage.   · Reposition parts that are out of alignment.   WHAT CAUSES INJURIES TO THE MENISCUS?  The meniscus is easily injured by the force of rotating the knee while bearing weight. A partial or total tear may occur when a person quickly twists or rotates the upper leg while the foot stays still. For example, when dribbling a basketball around an opponent or turning to hit a tennis ball. If the tear is tiny, the meniscus stays connected to the front and back of the knee. If the tear is large, the meniscus may be left in an abnormally mobile position which produces instability. The seriousness of a tear depends on its location and extent.  WHAT ARE SYMPTOMS OF INJURIES TO THE MENISCUS?  · Pain, particularly when the knee is straightened.   · If the pain is mild, the patient may continue with normal activity.   · Severe pain may occur if a fragment of the meniscus catches between the femur and the tibia.   · Swelling may occur soon after injury if blood vessels are disrupted. Swelling may occur several hours later if the joint fills with fluid produced by the joint lining (synovium)   as a result of inflammation. If the synovium is injured, it may become inflamed and produce fluid. This makes the knee swell.   · Sometimes, an injury that occurred in the past but was not treated becomes painful months or years later.   · After any injury, the knee may click, lock, feel weak, or give way without warning.   · Although symptoms of meniscal injury may disappear on their own (particularly with a stable meniscal tear), they frequently persist or return and require treatment.   HOW IS INJURY TO THE MENISCUS DIAGNOSED?  · The caregiver will listen to the patient's description of the pain and swelling. The caregiver will perform a physical examination and take  x-rays of the knee. The examination may include a test in which the caregiver bends the leg, and then rotates the leg outward and inward while extending it. Pain along the joint line or an audible click suggests a meniscal tear.   · An MRI may be done.   · Occasionally, the caregiver may use arthroscopy without obtaining the MRI to diagnose and treat a meniscal tear.   WHAT IS TREATMENT FOR INJURY TO THE MENISCUS?  · The caregiver may recommend a muscle-strengthening program if:   · The tear is minor.   · The pain and symptoms are improving.   · Exercises for meniscal problems are best started with guidance from a caregiver and physical therapist or athletic trainer. The therapist will make sure that the patient does the exercises properly and without risking new or repeat injury. The following exercises after injury to the meniscus are designed to build up the quadriceps and hamstring muscles and increase flexibility and strength.   · Warming up the joint by riding a stationary bicycle, then straightening and raising the leg (but not straightening it too much).   · Extending the leg while sitting (a weight may be worn on the ankle for this exercise).   · Raising the leg while lying on the stomach.   · Exercising in a pool (walking as fast as possible in chest-deep water, performing small flutter kicks while holding onto the side of the pool, and raising each leg to 90° in chest-deep water while pressing the back against the side of the pool).   · If the tear is more extensive, the caregiver may perform arthroscopic with or without open surgery to see the extent of injury and to repair the tear. The caregiver can sew the meniscus back in place if the patient is relatively young, if the injury is in an area with a good blood supply, and if the ligaments are intact. Most young athletes are able to return to active sports after meniscus repair.   · If the patient is elderly or the tear is in an area with a poor blood  supply, the caregiver may trim a small portion of the meniscus to even the surface. In rare cases, the caregiver removes the entire meniscus. Osteoarthritis is more likely to develop in the knee if the entire meniscus is removed.   · Recovery after surgical repair takes several weeks to months. Activity after surgery is slightly more restricted than when the meniscus is partially removed. However, putting weight on the joint actually helps recovery. Regardless of the form of surgery, rehabilitation usually includes:   · Walking.   · Bending the legs.   · Doing exercises that stretch and build up leg muscles.   · The best results of treatment for   meniscal injury are obtained in people who:   · Do not have articular cartilage changes.   · Have an intact ACL.   LIGAMENT INJURIES  WHAT ARE THE CAUSES OF ANTERIOR AND POSTERIOR CRUCIATE LIGAMENT INJURIES?  · Injury to the cruciate ligaments is sometimes referred to as a "sprain".   · The ACL is most often stretched or torn (or both) by a sudden twisting or pushing the ACL beyond its normal range. For example, when the feet are planted one way and the knee rotates in the opposite direction.   · The PCL is most often injured by a direct impact, such as in an automobile accident or football tackle.   WHAT ARE SYMPTOMS?  · Injury to a cruciate ligament may not cause pain. Symptoms may include:   · A popping sound   · Buckling when trying to stand on the leg.   · The caregiver will perform several physical exam tests. These tests are to see whether the parts of the knee stay in proper position when pressure is applied in different directions.   · A thorough examination is essential. An MRI is very accurate in detecting a complete tear. Arthroscopy may be the only reliable means of detecting a partial one.   TREATMENT  · For an incomplete tear, the caregiver may recommend that the patient begin an exercise program to strengthen surrounding muscles.   · The caregiver may also  prescribe a brace to protect the knee during activity.   · For a completely torn ACL in an active athlete and motivated person, the caregiver is likely to recommend surgery. The surgeon may reconstruct the torn ligament by using:   · A piece (graft) of healthy ligament from the patient (autograft)   · A piece of ligament from a tissue bank (allograft). One of the most important elements in a patient's successful recovery after cruciate ligament surgery is a 4- to 6-month exercise program. This program may involve using special exercise equipment at a rehabilitation or sports center. Successful surgery special exercises will allow the patient to return to a normal, active lifestyle.   MEDIAL AND LATERAL COLLATERAL LIGAMENT INJURIES  WHAT IS THE MOST COMMON CAUSE OF MEDIAL AND LATERAL COLLATERAL LIGAMENT INJURIES?  The MCL is more commonly injured than the LCL. The cause is most often a blow to the outer side of the knee. This injury stretches and tears the ligament on the inner side of the knee. Such blows frequently occur in contact sports like football or hockey.  SYMPTOMS AND DIAGNOSIS  · When injury to the MCL occurs, a person may feel a pop and the knee may buckle sideways.   · Pain and swelling are common.   · A thorough exam is needed to determine the kind and extent of the injury.   · To diagnose a collateral ligament injury, the caregiver exerts pressure on the side of the knee to determine the degree of pain and the looseness of the joint.   · An MRI is helpful in diagnosing injuries to these ligaments.   TREATMENT  · Most sprains of the collateral ligaments will heal if the patient follows a prescribed exercise program.   · In addition to exercise, the caregiver may recommend ice packs to reduce pain and swelling and a small sleeve-type brace to protect and stabilize the knee.   · A sprain may take 4 to 6 weeks to heal.   · A patient with a severely sprained   or torn collateral ligament may also have a torn  ACL. This usually requires surgical repair.   TENDON INJURIES AND DISORDERS  WHAT CAUSES TENDINITIS AND RUPTURED TENDONS?  · Knee tendon injuries range from tendinitis to a torn (ruptured) tendon.   · If a person overuses a tendon during certain activities such as dancing, cycling, or running, the tendon stretches like a worn-out rubber band and becomes inflamed.   · Also, trying to break a fall may cause the quadriceps muscles to contract and tear the quadriceps tendon above the patella or the patellar tendon below the patella. This type of injury is most likely to happen in older people.   · Tendinitis of the patellar tendon is sometimes called jumper's knee because in sports that require jumping, such as basketball or volleyball, the muscle contraction and force of hitting the ground after a jump strain the tendon.   · After repeated stress, the tendon may become inflamed or tear.   SYMPTOMS AND DIAGNOSIS  · People with tendinitis often have tenderness at the point where the patellar tendon meets the bone. In addition, they may feel pain during running, fast walking, or jumping.   · A complete rupture of the quadriceps or patellar tendon is painful. It also makes it difficult for a person to bend, extend, or lift the leg against gravity.   · If there is not much swelling, the caregiver may be able to feel a defect in the tendon near the tear during a physical examination.   · An x-ray will show that the patella is lower than normal in a quadriceps tendon tear and higher than normal in a patellar tendon tear. The caregiver may use an MRI to confirm a partial or total tear.   TREATMENT  · Initially, the caregiver may ask a patient with tendinitis to rest, elevate, and apply ice to the knee and to take medicines to relieve pain and decrease inflammation and swelling.   · If the quadriceps or patellar tendon is completely ruptured, a surgeon will reattach the ends. After surgery, the patient will wear a cast or brace  for 3 to 6 weeks and use crutches.   · For a partial tear, the caregiver might apply a cast or an extension knee brace without performing surgery.   · Rehabilitating a partial or complete tear of a tendon requires an exercise program that is similar to but less forceful than that used for ligament injuries. The goals of exercise are to restore the ability to bend and straighten the knee and to strengthen the leg to prevent repeat injury. A rehabilitation program may last 4 to 6 months. A patient can return to many activities before then.   OSGOOD-SCHLATTER DISEASE  WHAT CAUSES OSGOOD-SCHLATTER DISEASE?  · Osgood-Schlatter disease is caused by repetitive stress or tension on part of the growth area of the upper tibia (the apophysis). Symptoms included inflammation of the patellar tendon and surrounding soft tissues at the point where the tendon attaches to the tibia.   · The disease may also be associated with an injury in which the tendon is stretched so much that it tears away from the tibia and takes a fragment of bone with it.   · The disease generally affects active young people. Particularly boys between the ages of 10 and 15, who play games or sports that include frequent running and jumping and who have open growth plates.   SYMPTOMS AND DIAGNOSIS  · People with this disease   experience pain just below the knee joint. This pain usually worsens with activity and is relieved by rest.   · The bony bump that is particularly painful when pressed may increase in size at the upper edge of the tibia (below the knee cap).   · Usually motion of the knee is not affected.   · Pain may last a few months and may come back with periods of high activity until the child's growth is completed.   · Osgood-Schlatter disease is most often diagnosed by the symptoms and the physical exam. An x-ray may be normal, or show an injury. An x-ray, more typically, will show that the growth area is fragmented.   TREATMENT  · Usually, the  disease goes away without aggressive treatment.   · Applying ice to the knee when pain begins helps relieve inflammation. Applying ice is sometimes used along with stretching and strengthening exercises.   · The caregiver may advise the patient to limit participation in vigorous sports. Children who wish to continue less stressful sports activities may need to wear knee pads for protection and apply ice to the knee after activity. If there is a great deal of pain, sports activities may be limited until discomfort becomes tolerable.   ILIOTIBIAL BAND SYNDROME  WHAT CAUSES ILIOTIBIAL BAND SYNDROME?  This is an overuse condition in which inflammation results when a band of a tendon rubs over the outer bone of the knee. Although iliotibial band syndrome may be caused by direct injury to the knee, it is most often caused by the stress of long-term overuse.  SYMPTOMS AND DIAGNOSIS  · A person with this syndrome feels an ache or burning sensation at the outside of the knee during activity. Pain may be localized at the outside of the knee or radiate up the side of the thigh.   · A person may also feel a snap when the knee is bent and then straightened.   · Swelling may be absent and knee motion is normal.   · The diagnosis of this disorder is typically based on the symptoms. Symptoms include pain at the outer side of the knee. Other problems with similar symptoms must also be excluded.   TREATMENT  · Usually, the problem disappears if the person reduces activity and performs stretching exercises followed by muscle-strengthening exercises.   · In rare cases when the syndrome does not disappear, surgery may be necessary to split the tendon so it is not stretched too tightly over the bone.   OTHER KNEE INJURIES  WHAT IS OSTEOCHONDRITIS DISSECANS?  · Osteochondritis dissecans results from a loss of the blood supply to an area of bone at the joint surface and usually involves the knee. The affected bone and its covering of  cartilage gradually loosen and cause pain.   · This problem usually arises in an active adolescent or young adult. It may be due to a slight blockage of a small artery or to an unrecognized injury or tiny fracture that damages the overlying cartilage.   · Lack of a blood supply can cause bone to break down (avascular necrosis).   · The involvement of several joints or the appearance of the condition in several family members may indicate that the disorder is inherited.   · A person with this condition may eventually develop osteoarthritis.   SYMPTOMS AND DIAGNOSIS  · If normal healing does not occur, cartilage separates from the diseased bone and a fragment breaks loose into the knee joint.   This causes weakness, sharp pain, and locking of the joint.   · An x-ray, MRI, or arthroscopy can determine the condition of the cartilage and can be used to diagnose osteochondritis dissecans.   TREATMENT  · If cartilage fragments have not broken loose, a surgeon may fix the cartilage and underlying bone in place with pins or screws. These pins or screws are sunk into the cartilage to stimulate a new blood supply.   · If fragments are loose, the surgeon may scrape down the cavity to reach fresh bone and add a bone graft and fix the fragments in position. Fragments that cannot be mended are removed, and the cavity is drilled or scraped to stimulate new cartilage growth.   · Research is being done to assess the use of cartilage cell implants and other tissue transplants to treat this disorder.   WHAT IS PLICA SYNDROME?  · Plica syndrome occurs when plicae (bands of synovial tissue) are irritated by overuse or injury.   · Synovial plicae are the remains of tissue pouches found in the early stages of fetal development. As the fetus develops, these pouches normally combine to form one large synovial cavity. If this process is incomplete, plicae remain as folds or bands of synovial tissue within the knee.   · Injury, chronic overuse,  or inflammatory conditions are associated with this syndrome.   SYMPTOMS AND DIAGNOSIS  · People with this syndrome are likely to experience pain and swelling, a clicking sensation, and locking and weakness of the knee.   · Because the symptoms are similar to those of some other knee problems, plica syndrome is often misdiagnosed. Diagnosis usually depends on excluding other conditions that cause similar symptoms.   TREATMENT  · The goal of treatment is to reduce inflammation of the synovium and thickening of the plicae.   · The caregiver usually prescribes medicine to reduce inflammation.   · The patient is also advised to reduce activity, apply ice and an elastic bandage to the knee, and do strengthening exercises.   · A cortisone injection into the plica folds helps about half of those treated.   · If treatment fails to relieve symptoms within 3 months, the caregiver may recommend arthroscopic or open surgery to remove the plicae.   WHAT KINDS OF CAREGIVERS TREAT KNEE PROBLEMS?  · Extensive injuries and diseases of the knees are usually treated by an orthopedic surgeon, a doctor who has been trained in the nonsurgical and surgical treatment of bones, joints, and soft tissues such as ligaments, tendons, and muscles.   · Patients seeking nonsurgical treatment of arthritis of the knee may also consult a rheumatologist. This is a caregiver specializing in the diagnosis and treatment of arthritis and related disorders.   HOW CAN PEOPLE PREVENT KNEE PROBLEMS?  Some knee problems cannot be foreseen or prevented. However, a person can prevent many knee problems by following these suggestions:  · Before exercising or playing sports, warm up by walking or riding a stationary bicycle, and then do stretches. Stretching the muscles in the front of the thigh (quadriceps) and back of the thigh (hamstrings) reduces tension on the tendons. Stretching also relieves pressure on the knee during activity.   · Strengthen the leg  muscles by doing specific exercises (for example, by walking up stairs or hills, or by riding a stationary bicycle). A supervised workout with weights is another way to strengthen the leg muscles that support the knee.   · Avoid sudden changes in the

## 2011-05-06 NOTE — ED Provider Notes (Signed)
History     CSN: [id number]  Arrival date & time 05/06/11  2952   First MD Initiated Contact with Patient 05/06/11 1951      Chief Complaint  Patient presents with  . Leg Pain    (Consider location/radiation/quality/duration/timing/severity/associated sxs/prior treatment) HPI Comments: Patient presents today complaining of pain to her right knee. She states it radiates down her leg and up into her thigh. She's been treated recently for plantar fasciitis of her right foot and feels that it might be due to a flareup from the way she's been walking abnormally tutor planar fasciitis. She denies any swelling to her leg. Denies any chest pain or shortness of breath. She is seeing Dr. Pearletha Forge here in [facility] for her plantar fasciitis and she has a followup appointment on February 28.  The history is provided by the patient.    Past Medical History  Diagnosis Date  . GERD (gastroesophageal reflux disease)   . CAD (coronary artery disease)   . Hypertension   . Plantar fasciitis     Past Surgical History  Procedure Date  . Abdominal hysterectomy   . Inguinal hernia repair   . Carpal tunnel release   . Appendectomy     Family History  Problem Relation Age of Onset  . Heart attack Mother   . Hypertension Mother   . Heart attack Father   . Diabetes Other   . Hyperlipidemia Other   . Hypertension Other     History  Substance Use Topics  . Smoking status: Former Games developer  . Smokeless tobacco: Not on file  . Alcohol Use: No    OB History    Grav Para Term Preterm Abortions TAB SAB Ect Mult Living                  Review of Systems  Constitutional: Negative for fever, chills, diaphoresis and fatigue.  HENT: Negative for congestion, rhinorrhea and sneezing.   Eyes: Negative.   Respiratory: Negative for cough, chest tightness and shortness of breath.   Cardiovascular: Negative for chest pain and leg swelling.  Gastrointestinal: Negative for nausea, vomiting,  abdominal pain, diarrhea and blood in stool.  Genitourinary: Negative for frequency, hematuria, flank pain and difficulty urinating.  Musculoskeletal: Positive for arthralgias. Negative for back pain.  Skin: Negative for rash.  Neurological: Negative for dizziness, speech difficulty, weakness, numbness and headaches.    Allergies  Review of patient's allergies indicates no known allergies.  Home Medications   Current Outpatient Rx  Name Route Sig Dispense Refill  . ACETAMINOPHEN 500 MG PO TABS Oral Take 1,000 mg by mouth every 6 (six) hours as needed. For pain    . ASPIRIN 81 MG PO TABS Oral Take 81 mg by mouth daily.      Marland Kitchen CALTRATE 600 PLUS-VIT D PO Oral Take 1 tablet by mouth 2 (two) times daily.    Marland Kitchen CETIRIZINE HCL 10 MG PO TABS Oral Take 10 mg by mouth daily.      Marland Kitchen VITAMIN D 1000 UNITS PO TABS Oral Take 1,000 Units by mouth daily.    Marland Kitchen CLOPIDOGREL BISULFATE 75 MG PO TABS Oral Take 75 mg by mouth daily.    Marland Kitchen CLORAZEPATE DIPOTASSIUM 7.5 MG PO TABS Oral Take 7.5 mg by mouth once as needed. For nerves    . GLUCOSAMINE-CHONDROITIN 500-400 MG PO TABS Oral Take 1 tablet by mouth 2 (two) times daily.     Marland Kitchen LISINOPRIL 20 MG PO TABS Oral  Take 20 mg by mouth daily.      . NEBIVOLOL HCL 2.5 MG PO TABS Oral Take 2.5 mg by mouth daily.      Marland Kitchen PANTOPRAZOLE SODIUM 40 MG PO TBEC Oral Take 40 mg by mouth daily.    Marland Kitchen ALIGN 4 MG PO CAPS Oral Take 1 capsule by mouth daily.    Marland Kitchen SIMVASTATIN 40 MG PO TABS Oral Take 40 mg by mouth at bedtime.        BP 172/67  Pulse 66  Temp(Src) 98 F (36.7 C) (Oral)  Resp 18  Ht 5\' 4"  (1.626 m)  Wt 147 lb (66.679 kg)  BMI 25.23 kg/m2  SpO2 99%  Physical Exam  Constitutional: She is oriented to person, place, and time. She appears well-developed and well-nourished.  HENT:  Head: Normocephalic and atraumatic.  Eyes: Pupils are equal, round, and reactive to light.  Neck: Normal range of motion. Neck supple.  Cardiovascular: Normal rate, regular rhythm and  normal heart sounds.   Pulmonary/Chest: Effort normal and breath sounds normal. No respiratory distress. She has no wheezes. She has no rales. She exhibits no tenderness.  Abdominal: Soft. Bowel sounds are normal. There is no tenderness. There is no rebound and no guarding.  Musculoskeletal: Normal range of motion. She exhibits no edema.       Patient has tenderness over the medial aspect of the right knee at the joint line. There is a small joint effusion. There is also some small amount of tenderness to the proximal tibia. She is able to straight leg raise. There's no warmth or edema noted to the knee. There is no ligament laxity noted on exam. She has normal sensation motor function to the right foot. Normal pulses in the feet. No swelling or deformity is noted to the leg. There's no pain in the posterior calf or groin area. No pain on range of motion of the hip.  Lymphadenopathy:    She has no cervical adenopathy.  Neurological: She is alert and oriented to person, place, and time.  Skin: Skin is warm and dry. No rash noted.  Psychiatric: She has a normal mood and affect.    ED Course  Procedures (including critical care time)  Labs Reviewed - No data to display Dg Knee 1-2 Views Right  05/06/2011  *RADIOLOGY REPORT*  Clinical Data: Right knee pain  RIGHT KNEE - 1-2 VIEW  Comparison: None.  Findings: No acute fracture and no dislocation.  Minimal spurring at the superior patella.  Unremarkable soft tissues.  IMPRESSION: No acute bony pathology.  Original Report Authenticated By: Donavan Burnet, M.D.     1. Knee strain       MDM  Patient has point tenderness to the right knee. I feel that her symptoms are due to a flareup of arthritis in the knee, likely related to the different and her gait secondary to plantar fasciitis. I don't see any thing to suggest DVT. There is no evidence of cellulitis or septic arthritis. She denies any for any other pain medicine other than Tylenol. Will  followup with Dr. Pearletha Forge on February 28 or sooner if her symptoms worsen.        Rolan Bucco, MD 05/06/11 2040

## 2011-05-06 NOTE — ED Notes (Signed)
Right leg pain started yesterday-denies injury

## 2011-05-10 ENCOUNTER — Ambulatory Visit: Payer: Medicare Other | Admitting: Family Medicine

## 2011-05-10 ENCOUNTER — Ambulatory Visit (INDEPENDENT_AMBULATORY_CARE_PROVIDER_SITE_OTHER): Payer: Medicare Other | Admitting: Family Medicine

## 2011-05-10 ENCOUNTER — Encounter: Payer: Self-pay | Admitting: Family Medicine

## 2011-05-10 VITALS — BP 167/88 | HR 56 | Temp 97.6°F | Ht 64.0 in | Wt 147.0 lb

## 2011-05-10 DIAGNOSIS — M25569 Pain in unspecified knee: Secondary | ICD-10-CM | POA: Diagnosis not present

## 2011-05-10 DIAGNOSIS — M25561 Pain in right knee: Secondary | ICD-10-CM | POA: Insufficient documentation

## 2011-05-10 NOTE — Progress Notes (Signed)
Subjective:    Patient ID: [name], female    DOB: 12-Jul-1926, 78 y.o.   MRN: [id number]  PCP: Dr. Martha Clan  HPI 78 yo F here for right knee pain.  Patient reports no known injury. States she has developed slowly worsening anterior right knee pain to now a 10/10. Pain worse with walking, prolonged sitting. No swelling or bruising. No catching, locking, instability. Has had problems with left knee before but not right knee. Not tried anything for pain (previously on tylenol as needed).  Past Medical History  Diagnosis Date  . GERD (gastroesophageal reflux disease)   . CAD (coronary artery disease)   . Hypertension   . Plantar fasciitis     Current Outpatient Prescriptions on File Prior to Visit  Medication Sig Dispense Refill  . acetaminophen (TYLENOL) 500 MG tablet Take 1,000 mg by mouth every 6 (six) hours as needed. For pain      . aspirin 81 MG tablet Take 81 mg by mouth daily.        . Calcium-Vitamin D (CALTRATE 600 PLUS-VIT D PO) Take 1 tablet by mouth 2 (two) times daily.      . cetirizine (ZYRTEC) 10 MG tablet Take 10 mg by mouth daily.        . cholecalciferol (VITAMIN D) 1000 UNITS tablet Take 1,000 Units by mouth daily.      . clopidogrel (PLAVIX) 75 MG tablet Take 75 mg by mouth daily.      . clorazepate (TRANXENE) 7.5 MG tablet Take 7.5 mg by mouth once as needed. For nerves      . glucosamine-chondroitin 500-400 MG tablet Take 1 tablet by mouth 2 (two) times daily.       Marland Kitchen lisinopril (PRINIVIL,ZESTRIL) 20 MG tablet Take 20 mg by mouth daily.        . nebivolol (BYSTOLIC) 2.5 MG tablet Take 2.5 mg by mouth daily.        . pantoprazole (PROTONIX) 40 MG tablet Take 40 mg by mouth daily.      . Probiotic Product (ALIGN) 4 MG CAPS Take 1 capsule by mouth daily.      . simvastatin (ZOCOR) 40 MG tablet Take 40 mg by mouth at bedtime.          Past Surgical History  Procedure Date  . Abdominal hysterectomy   . Inguinal hernia repair   . Carpal tunnel  release   . Appendectomy     No Known Allergies  History   Social History  . Marital Status: Married    Spouse Name: N/A    Number of Children: N/A  . Years of Education: N/A   Occupational History  . Not on file.   Social History Main Topics  . Smoking status: Former Games developer  . Smokeless tobacco: Not on file  . Alcohol Use: No  . Drug Use: No  . Sexually Active: Not on file   Other Topics Concern  . Not on file   Social History Narrative  . No narrative on file    Family History  Problem Relation Age of Onset  . Heart attack Mother   . Hypertension Mother   . Heart attack Father   . Diabetes Other   . Hyperlipidemia Other   . Hypertension Other     BP 167/88  Pulse 56  Temp(Src) 97.6 F (36.4 C) (Oral)  Ht 5\' 4"  (1.626 m)  Wt 147 lb (66.679 kg)  BMI 25.23 kg/m2  Review of Systems  See HPI above.    Objective:   Physical Exam Gen: NAD  R knee: No gross deformity, ecchymoses.  No effusion but soft tissue swelling medial joint line. Mod TTP medial joint line.  No lateral joint line, pes, post patellar facet TTP. FROM. Negative ant/post drawers. Negative valgus/varus testing. Negative lachmanns. Negative mcmurrays, apleys, patellar apprehension, clarkes. NV intact distally.  L knee: FROM without pain, swelling, instability.    Assessment & Plan:  1. Right knee pain - radiographs with only mild DJD but these were not weight bearing.  Believe this is the likely cause of her pain given her history, location of tenderness, lack of injury.  Degenerative medial meniscal tear another possibility.  She would like to go ahead with cortisone injection which was given today.  Discussed tylenol, glucosamine (already taking), ice/heat.  See instructions for further.  After informed written consent, patient was seated on exam table. Right knee was prepped with alcohol swab and utilizing anterolateral approach, patient's right knee was injected intraarticularly with  3:1 marcaine: depomedrol. Patient tolerated the procedure well without immediate complications.

## 2011-05-10 NOTE — Assessment & Plan Note (Signed)
radiographs with only mild DJD but these were not weight bearing.  Believe this is the likely cause of her pain given her history, location of tenderness, lack of injury.  Degenerative medial meniscal tear another possibility.  She would like to go ahead with cortisone injection which was given today.  Discussed tylenol, glucosamine (already taking), ice/heat.  See instructions for further.  After informed written consent, patient was seated on exam table. Right knee was prepped with alcohol swab and utilizing anterolateral approach, patient's right knee was injected intraarticularly with 3:1 marcaine: depomedrol. Patient tolerated the procedure well without immediate complications

## 2011-05-10 NOTE — Patient Instructions (Signed)
You have mild arthritis of your right knee but this is very painful. The following are things you can do for arthritis: Take tylenol 500mg  1-2 tabs three times a day for pain. Aleve 1-2 tabs twice a day with food (check with your heart doctor before taking this medicine!) Glucosamine sulfate 750mg  twice a day is a supplement that has been shown to help moderate to severe arthritis. Capsaicin topically up to four times a day may also help with pain. Cortisone injections are an option. If cortisone injections do not help, there are different types of shots that may help but they take longer to take effect. It's important that you continue to stay active. Start straight leg raise and quad flexion exercises 3 sets of 8 once a day. Consider physical therapy to strengthen muscles around the joint that hurts to take pressure off of the joint itself. Heat or ice 15 minutes at a time 3-4 times a day as needed to help with pain. Follow up with me in 1 month or as needed.

## 2011-05-12 DIAGNOSIS — B029 Zoster without complications: Secondary | ICD-10-CM | POA: Diagnosis not present

## 2011-05-12 DIAGNOSIS — M25569 Pain in unspecified knee: Secondary | ICD-10-CM | POA: Diagnosis not present

## 2011-05-13 ENCOUNTER — Ambulatory Visit: Payer: Medicare Other | Admitting: Family Medicine

## 2011-05-24 DIAGNOSIS — N938 Other specified abnormal uterine and vaginal bleeding: Secondary | ICD-10-CM | POA: Insufficient documentation

## 2011-06-01 DIAGNOSIS — L578 Other skin changes due to chronic exposure to nonionizing radiation: Secondary | ICD-10-CM | POA: Diagnosis not present

## 2011-06-01 DIAGNOSIS — L570 Actinic keratosis: Secondary | ICD-10-CM | POA: Diagnosis not present

## 2011-06-02 ENCOUNTER — Ambulatory Visit (INDEPENDENT_AMBULATORY_CARE_PROVIDER_SITE_OTHER): Payer: Medicare Other | Admitting: Obstetrics and Gynecology

## 2011-06-02 ENCOUNTER — Encounter: Payer: Self-pay | Admitting: Obstetrics and Gynecology

## 2011-06-02 ENCOUNTER — Other Ambulatory Visit (HOSPITAL_COMMUNITY)
Admission: RE | Admit: 2011-06-02 | Discharge: 2011-06-02 | Disposition: A | Discharge status: Home / Self Care | Payer: Medicare Other | Source: Ambulatory Visit | Attending: Obstetrics and Gynecology | Admitting: Obstetrics and Gynecology

## 2011-06-02 ENCOUNTER — Other Ambulatory Visit (HOSPITAL_BASED_OUTPATIENT_CLINIC_OR_DEPARTMENT_OTHER): Payer: Self-pay | Admitting: Internal Medicine

## 2011-06-02 VITALS — BP 114/70 | Ht 64.0 in | Wt 147.0 lb

## 2011-06-02 DIAGNOSIS — N952 Postmenopausal atrophic vaginitis: Secondary | ICD-10-CM

## 2011-06-02 DIAGNOSIS — B029 Zoster without complications: Secondary | ICD-10-CM | POA: Insufficient documentation

## 2011-06-02 DIAGNOSIS — E7800 Pure hypercholesterolemia, unspecified: Secondary | ICD-10-CM | POA: Insufficient documentation

## 2011-06-02 DIAGNOSIS — C801 Malignant (primary) neoplasm, unspecified: Secondary | ICD-10-CM | POA: Insufficient documentation

## 2011-06-02 DIAGNOSIS — N951 Menopausal and female climacteric states: Secondary | ICD-10-CM

## 2011-06-02 DIAGNOSIS — Z1231 Encounter for screening mammogram for malignant neoplasm of breast: Secondary | ICD-10-CM

## 2011-06-02 DIAGNOSIS — Z780 Asymptomatic menopausal state: Secondary | ICD-10-CM

## 2011-06-02 DIAGNOSIS — C55 Malignant neoplasm of uterus, part unspecified: Secondary | ICD-10-CM | POA: Diagnosis not present

## 2011-06-02 DIAGNOSIS — Z01419 Encounter for gynecological examination (general) (routine) without abnormal findings: Secondary | ICD-10-CM | POA: Insufficient documentation

## 2011-06-02 NOTE — Progress Notes (Signed)
The patient came to see me today for followup of her uterine cancer. She continues to do well. She's had no vaginal bleeding. She is having no pelvic pain. She is up-to-date on mammograms. She does her bone density through her PCP and is normal. She just recovered from shingles. She continues to have intermittent hot flashes but is fine without medication. She has atrophic vaginitis but is not sexually active and does not require treatment. She is having no dysuria, frequency, urgency, or incontinence of urination.  ROS: 12 system review done. Pertinent positives above. The other positive is osteoarthritis and hypertension.  HEENT: Within normal limits.[name] present.Neck: No masses. Supraclavicular lymph nodes: Not enlarged. Breasts: Examined in both sitting and lying position. Symmetrical without skin changes or masses. Abdomen: Soft no masses guarding or rebound. No hernias. Pelvic: External within normal limits. BUS within normal limits. Vaginal examination shows poor estrogen effect, no cystocele enterocele or rectocele. Cervix and uterus absent. Adnexa within normal limits. Rectovaginal confirmatory. Extremities within normal limits.  Assessment: #1. Uterine cancer-no existing disease #2 persistent vasomotor symptoms #3 atrophic vaginitis-asymptomatic  Plan: Continue yearly mammogram. No treatment for hot flashes.

## 2011-06-07 ENCOUNTER — Encounter: Payer: Self-pay | Admitting: Family Medicine

## 2011-06-07 ENCOUNTER — Ambulatory Visit (INDEPENDENT_AMBULATORY_CARE_PROVIDER_SITE_OTHER): Payer: Medicare Other | Admitting: Family Medicine

## 2011-06-07 VITALS — BP 136/80 | HR 58

## 2011-06-07 DIAGNOSIS — M25569 Pain in unspecified knee: Secondary | ICD-10-CM

## 2011-06-07 DIAGNOSIS — M79671 Pain in right foot: Secondary | ICD-10-CM

## 2011-06-07 DIAGNOSIS — M79609 Pain in unspecified limb: Secondary | ICD-10-CM

## 2011-06-07 DIAGNOSIS — M25561 Pain in right knee: Secondary | ICD-10-CM

## 2011-06-07 NOTE — Patient Instructions (Signed)
Your pain in your right knee is likely due to a degenerative medial meniscus tear as well as mild pes bursitis. Start physical therapy for these conditions. I wouldn't repeat a cortisone injection into your knee since the first one only helped you for a day. Continue tylenol 500mg  1-2 tabs three times a day as needed for pain. Aleve twice a day for pain in addition to tylenol as needed with food. Continue glucosamine. Consider topical capsaicin four times a day for pain in your knee and foot. Ice knee after physical therapy and as needed for pain/swelling. Can consider an injection into the bursa area below your knee or MRI (would only consider this if thinking about surgery though which I would like to avoid).  Your foot pain is due to a peroneal tendon strain of the outside of your foot. This should improve over 4-6 weeks on average. Can consider a hard soled shoe if pain persists. Follow up with me in 1 month for your knee.

## 2011-06-08 ENCOUNTER — Encounter: Payer: Self-pay | Admitting: Family Medicine

## 2011-06-08 DIAGNOSIS — M79671 Pain in right foot: Secondary | ICD-10-CM | POA: Insufficient documentation

## 2011-06-08 NOTE — Assessment & Plan Note (Signed)
2/2 peroneal tendon strain, mild.  No focal bony tenderness.  No swelling or deformity.  Hurts a little with external rotation and essentially only with walking.  Reassured patient. Icing, meds as for her knee pain.

## 2011-06-08 NOTE — Assessment & Plan Note (Signed)
would expect if her pain was solely due to DJD that injection would have given her lasting relief.  Continues with medial joint line pain and only mild DJD on radiographs - concerning for degenerative medial meniscal tear.  Also with tenderness at pes bursa area.  Will start physical therapy for both conditions.  Discussed we could consider pes injection but doubt repeating intraarticular injection would help her much.  Again discussed tylenol, glucosamine (already taking), ice/heat.  See instructions for further.

## 2011-06-08 NOTE — Progress Notes (Signed)
Subjective:    Patient ID: [name], female    DOB: 1926/07/13, 78 y.o.   MRN: [id number]  PCP: Dr. Martha Clan  HPI  78 yo F here for f/u right knee pain.  2/25: Patient reports no known injury. States she has developed slowly worsening anterior right knee pain to now a 10/10. Pain worse with walking, prolonged sitting. No swelling or bruising. No catching, locking, instability. Has had problems with left knee before but not right knee. Not tried anything for pain (previously on tylenol as needed).  3/25: Patient states knee injection helped her for about a day. She does report overall pain has improved - developed a rash medial aspect of right knee consistent with shingles and was treated with valtrex and prednisone - rash has gone away. Pain is still anteromedial. No new injuries. No catching, locking, instability. Takes tylenol to help with sleep. Also reports about 1-2 weeks ago she 'pulled something' on outside of right foot after bending down - some limping following this. Foot has improved a little bit but still hard to walk on. No pain in foot at rest.  Past Medical History  Diagnosis Date  . GERD (gastroesophageal reflux disease)   . CAD (coronary artery disease)   . Plantar fasciitis   . DUB (dysfunctional uterine bleeding)   . Hypertension   . Shingles   . Elevated cholesterol   . Cancer     UTERINE    Current Outpatient Prescriptions on File Prior to Visit  Medication Sig Dispense Refill  . acetaminophen (TYLENOL) 500 MG tablet Take 1,000 mg by mouth every 6 (six) hours as needed. For pain      . aspirin 81 MG tablet Take 81 mg by mouth daily.        . Calcium-Vitamin D (CALTRATE 600 PLUS-VIT D PO) Take 1 tablet by mouth 2 (two) times daily.      . cetirizine (ZYRTEC) 10 MG tablet Take 10 mg by mouth daily.        . cholecalciferol (VITAMIN D) 1000 UNITS tablet Take 1,000 Units by mouth daily.      . clopidogrel (PLAVIX) 75 MG tablet Take 75 mg by  mouth daily.      . clorazepate (TRANXENE) 7.5 MG tablet Take 7.5 mg by mouth once as needed. For nerves      . glucosamine-chondroitin 500-400 MG tablet Take 1 tablet by mouth 2 (two) times daily.       Marland Kitchen lisinopril (PRINIVIL,ZESTRIL) 20 MG tablet Take 20 mg by mouth daily.        . nebivolol (BYSTOLIC) 2.5 MG tablet Take 2.5 mg by mouth daily.        . pantoprazole (PROTONIX) 40 MG tablet Take 40 mg by mouth daily.      . Pregabalin (LYRICA PO) Take by mouth.      . Probiotic Product (ALIGN) 4 MG CAPS Take 1 capsule by mouth daily.      . simvastatin (ZOCOR) 40 MG tablet Take 40 mg by mouth at bedtime.          Past Surgical History  Procedure Date  . Inguinal hernia repair   . Carpal tunnel release   . Cardiac stint implant 2008  . Vaginal hysterectomy 2001    LAVH, BSO  . Oophorectomy 2001    LAVH,BSO  . Ulna nerve release 2009  . Appendectomy 2011    Allergies  Allergen Reactions  . Sulfa Antibiotics Nausea And Vomiting  History   Social History  . Marital Status: Married    Spouse Name: N/A    Number of Children: N/A  . Years of Education: N/A   Occupational History  . Not on file.   Social History Main Topics  . Smoking status: Former Games developer  . Smokeless tobacco: Not on file  . Alcohol Use: No  . Drug Use: No  . Sexually Active: No   Other Topics Concern  . Not on file   Social History Narrative  . No narrative on file    Family History  Problem Relation Age of Onset  . Heart attack Mother   . Hypertension Mother   . Heart attack Father   . Heart disease Father   . Heart disease Brother     BP 136/80  Pulse 58  Review of Systems  See HPI above.    Objective:   Physical Exam  Gen: NAD  R knee: No gross deformity, ecchymoses.  No effusion but soft tissue swelling medial joint line. Mod TTP medial joint line.  Mild TTP pes (new).  No lateral joint line, post patellar facet TTP. FROM. Negative ant/post drawers. Negative valgus/varus  testing. Negative lachmanns. Mild + mcmurrays medially.  Negative apeys, patellar apprehension, clarkes. NV intact distally.  R foot: No gross deformity, swelling, bruising. No focal TTP about peroneal tendons, 5th MT, malleoli.  Still with mild TTP plantar fascia. FROM ankle and all digits.  Mild pain with resisted external rotation.  Strength 5/5 all motions. Neg ant drawer and talar tilt. Neg thompsons. NVI distally.      Assessment & Plan:  1. Right knee pain - would expect if her pain was solely due to DJD that injection would have given her lasting relief.  Continues with medial joint line pain and only mild DJD on radiographs - concerning for degenerative medial meniscal tear.  Also with tenderness at pes bursa area.  Will start physical therapy for both conditions.  Discussed we could consider pes injection but doubt repeating intraarticular injection would help her much.  Again discussed tylenol, glucosamine (already taking), ice/heat.  See instructions for further.  2. Right foot pain - 2/2 peroneal tendon strain, mild.  No focal bony tenderness.  No swelling or deformity.  Hurts a little with external rotation and essentially only with walking.  Reassured patient. Icing, meds as for her knee pain.

## 2011-07-02 ENCOUNTER — Ambulatory Visit (HOSPITAL_BASED_OUTPATIENT_CLINIC_OR_DEPARTMENT_OTHER): Payer: Medicare Other

## 2011-07-06 ENCOUNTER — Ambulatory Visit (HOSPITAL_BASED_OUTPATIENT_CLINIC_OR_DEPARTMENT_OTHER)
Admission: RE | Admit: 2011-07-06 | Discharge: 2011-07-06 | Disposition: A | Discharge status: Home / Self Care | Payer: Medicare Other | Source: Ambulatory Visit | Attending: Internal Medicine | Admitting: Internal Medicine

## 2011-07-06 DIAGNOSIS — Z1231 Encounter for screening mammogram for malignant neoplasm of breast: Secondary | ICD-10-CM

## 2011-07-08 ENCOUNTER — Ambulatory Visit (INDEPENDENT_AMBULATORY_CARE_PROVIDER_SITE_OTHER): Payer: Medicare Other | Admitting: Family Medicine

## 2011-07-08 ENCOUNTER — Encounter: Payer: Self-pay | Admitting: Family Medicine

## 2011-07-08 VITALS — BP 102/71 | HR 66 | Temp 97.9°F | Ht 64.0 in | Wt 147.0 lb

## 2011-07-08 DIAGNOSIS — M25569 Pain in unspecified knee: Secondary | ICD-10-CM | POA: Diagnosis not present

## 2011-07-08 DIAGNOSIS — M79609 Pain in unspecified limb: Secondary | ICD-10-CM

## 2011-07-08 DIAGNOSIS — M25561 Pain in right knee: Secondary | ICD-10-CM

## 2011-07-08 DIAGNOSIS — M79671 Pain in right foot: Secondary | ICD-10-CM

## 2011-07-09 ENCOUNTER — Encounter: Payer: Self-pay | Admitting: Family Medicine

## 2011-07-09 NOTE — Assessment & Plan Note (Signed)
2/2 peroneal tendon strain, mild.  Resolved.

## 2011-07-09 NOTE — Progress Notes (Signed)
Subjective:    Patient ID: [name], female    DOB: 20-Nov-1926, 78 y.o.   MRN: [id number]  PCP: Dr. Martha Clan  HPI  78 yo F here for f/u right knee and ankle pain.  2/25: Patient reports no known injury. States she has developed slowly worsening anterior right knee pain to now a 10/10. Pain worse with walking, prolonged sitting. No swelling or bruising. No catching, locking, instability. Has had problems with left knee before but not right knee. Not tried anything for pain (previously on tylenol as needed).  3/25: Patient states knee injection helped her for about a day. She does report overall pain has improved - developed a rash medial aspect of right knee consistent with shingles and was treated with valtrex and prednisone - rash has gone away. Pain is still anteromedial. No new injuries. No catching, locking, instability. Takes tylenol to help with sleep. Also reports about 1-2 weeks ago she 'pulled something' on outside of right foot after bending down - some limping following this. Foot has improved a little bit but still hard to walk on. No pain in foot at rest.  4/25: Patient reports her knee and ankle pain have improved. No longer having any ankle pain - knee pain is very minimal. She never received a phone call about physical therapy. No longer taking tylenol or aleve but continuing with capsaicin.  Past Medical History  Diagnosis Date  . GERD (gastroesophageal reflux disease)   . CAD (coronary artery disease)   . Plantar fasciitis   . DUB (dysfunctional uterine bleeding)   . Hypertension   . Shingles   . Elevated cholesterol   . Cancer     UTERINE    Current Outpatient Prescriptions on File Prior to Visit  Medication Sig Dispense Refill  . acetaminophen (TYLENOL) 500 MG tablet Take 1,000 mg by mouth every 6 (six) hours as needed. For pain      . aspirin 81 MG tablet Take 81 mg by mouth daily.        . Calcium-Vitamin D (CALTRATE 600 PLUS-VIT D PO)  Take 1 tablet by mouth 2 (two) times daily.      . cetirizine (ZYRTEC) 10 MG tablet Take 10 mg by mouth daily.        . cholecalciferol (VITAMIN D) 1000 UNITS tablet Take 1,000 Units by mouth daily.      . clopidogrel (PLAVIX) 75 MG tablet Take 75 mg by mouth daily.      . clorazepate (TRANXENE) 7.5 MG tablet Take 7.5 mg by mouth once as needed. For nerves      . glucosamine-chondroitin 500-400 MG tablet Take 1 tablet by mouth 2 (two) times daily.       Marland Kitchen lisinopril (PRINIVIL,ZESTRIL) 20 MG tablet Take 20 mg by mouth daily.        . nebivolol (BYSTOLIC) 2.5 MG tablet Take 2.5 mg by mouth daily.        . pantoprazole (PROTONIX) 40 MG tablet Take 40 mg by mouth daily.      . Pregabalin (LYRICA PO) Take by mouth.      . Probiotic Product (ALIGN) 4 MG CAPS Take 1 capsule by mouth daily.      . simvastatin (ZOCOR) 40 MG tablet Take 40 mg by mouth at bedtime.          Past Surgical History  Procedure Date  . Inguinal hernia repair   . Carpal tunnel release   . Cardiac stint implant 2008  .  Vaginal hysterectomy 2001    LAVH, BSO  . Oophorectomy 2001    LAVH,BSO  . Ulna nerve release 2009  . Appendectomy 2011    Allergies  Allergen Reactions  . Sulfa Antibiotics Nausea And Vomiting    History   Social History  . Marital Status: Married    Spouse Name: N/A    Number of Children: N/A  . Years of Education: N/A   Occupational History  . Not on file.   Social History Main Topics  . Smoking status: Former Games developer  . Smokeless tobacco: Not on file  . Alcohol Use: No  . Drug Use: No  . Sexually Active: No   Other Topics Concern  . Not on file   Social History Narrative  . No narrative on file    Family History  Problem Relation Age of Onset  . Heart attack Mother   . Hypertension Mother   . Heart attack Father   . Heart disease Father   . Heart disease Brother     BP 102/71  Pulse 66  Temp(Src) 97.9 F (36.6 C) (Oral)  Ht 5\' 4"  (1.626 m)  Wt 147 lb (66.679 kg)   BMI 25.23 kg/m2  Review of Systems  See HPI above.    Objective:   Physical Exam  Gen: NAD  R knee: No gross deformity, ecchymoses.  No effusion but soft tissue swelling medial joint line. No TTP medial or lateral joint line, no post patellar facet TTP.  Mild pes TTP. FROM. Negative ant/post drawers. Negative valgus/varus testing. Negative lachmanns. Negative mcmurrays.  Negative apeys, patellar apprehension, clarkes. NV intact distally.  R foot: No gross deformity, swelling, bruising. No focal TTP about peroneal tendons, 5th MT, malleoli, plantar fascia. FROM ankle and all digits.  No pain with resisted external rotation.  Strength 5/5 all motions. Neg ant drawer and talar tilt. Neg thompsons. NVI distally.      Assessment & Plan:  1. Right knee pain - much improved following one cortisone injection (may have taken longer than expected to help her) - 2/2 mild DJD vs degen meniscal tear.  Also with tenderness at pes bursa area but this is tolerable - would not like any treatment for this at this time.  Will hold off on physical therapy for now.  Again discussed tylenol, glucosamine (already taking), ice/heat.  F/u prn.  2. Right foot pain - 2/2 peroneal tendon strain, mild.  Resolved.

## 2011-07-09 NOTE — Assessment & Plan Note (Signed)
much improved following one cortisone injection (may have taken longer than expected to help her) - 2/2 mild DJD vs degen meniscal tear.  Also with tenderness at pes bursa area but this is tolerable - would not like any treatment for this at this time.  Will hold off on physical therapy for now.  Again discussed tylenol, glucosamine (already taking), ice/heat.  F/u prn.

## 2011-09-22 DIAGNOSIS — D1801 Hemangioma of skin and subcutaneous tissue: Secondary | ICD-10-CM | POA: Diagnosis not present

## 2011-09-22 DIAGNOSIS — L821 Other seborrheic keratosis: Secondary | ICD-10-CM | POA: Diagnosis not present

## 2011-10-16 DIAGNOSIS — S51009A Unspecified open wound of unspecified elbow, initial encounter: Secondary | ICD-10-CM | POA: Diagnosis not present

## 2011-11-01 DIAGNOSIS — I2510 Atherosclerotic heart disease of native coronary artery without angina pectoris: Secondary | ICD-10-CM | POA: Diagnosis not present

## 2011-11-01 DIAGNOSIS — E7800 Pure hypercholesterolemia, unspecified: Secondary | ICD-10-CM | POA: Diagnosis not present

## 2011-11-01 DIAGNOSIS — I10 Essential (primary) hypertension: Secondary | ICD-10-CM | POA: Diagnosis not present

## 2011-12-10 DIAGNOSIS — H43819 Vitreous degeneration, unspecified eye: Secondary | ICD-10-CM | POA: Diagnosis not present

## 2011-12-10 DIAGNOSIS — H35379 Puckering of macula, unspecified eye: Secondary | ICD-10-CM | POA: Diagnosis not present

## 2012-01-06 DIAGNOSIS — M949 Disorder of cartilage, unspecified: Secondary | ICD-10-CM | POA: Diagnosis not present

## 2012-01-06 DIAGNOSIS — I10 Essential (primary) hypertension: Secondary | ICD-10-CM | POA: Diagnosis not present

## 2012-01-06 DIAGNOSIS — E785 Hyperlipidemia, unspecified: Secondary | ICD-10-CM | POA: Diagnosis not present

## 2012-01-06 DIAGNOSIS — M899 Disorder of bone, unspecified: Secondary | ICD-10-CM | POA: Diagnosis not present

## 2012-01-06 DIAGNOSIS — R82998 Other abnormal findings in urine: Secondary | ICD-10-CM | POA: Diagnosis not present

## 2012-01-06 DIAGNOSIS — I2510 Atherosclerotic heart disease of native coronary artery without angina pectoris: Secondary | ICD-10-CM | POA: Diagnosis not present

## 2012-01-13 DIAGNOSIS — Z1331 Encounter for screening for depression: Secondary | ICD-10-CM | POA: Diagnosis not present

## 2012-01-13 DIAGNOSIS — I10 Essential (primary) hypertension: Secondary | ICD-10-CM | POA: Diagnosis not present

## 2012-01-13 DIAGNOSIS — Z23 Encounter for immunization: Secondary | ICD-10-CM | POA: Diagnosis not present

## 2012-01-13 DIAGNOSIS — E785 Hyperlipidemia, unspecified: Secondary | ICD-10-CM | POA: Diagnosis not present

## 2012-01-13 DIAGNOSIS — I259 Chronic ischemic heart disease, unspecified: Secondary | ICD-10-CM | POA: Diagnosis not present

## 2012-01-13 DIAGNOSIS — Z0000 Encounter for general adult medical examination without abnormal findings: Secondary | ICD-10-CM | POA: Diagnosis not present

## 2012-02-27 ENCOUNTER — Emergency Department (HOSPITAL_BASED_OUTPATIENT_CLINIC_OR_DEPARTMENT_OTHER): Payer: Medicare Other

## 2012-02-27 ENCOUNTER — Emergency Department (HOSPITAL_BASED_OUTPATIENT_CLINIC_OR_DEPARTMENT_OTHER)
Admission: EM | Admit: 2012-02-27 | Discharge: 2012-02-27 | Disposition: A | Discharge status: Home / Self Care | Payer: Medicare Other | Attending: Emergency Medicine | Admitting: Emergency Medicine

## 2012-02-27 ENCOUNTER — Encounter (HOSPITAL_BASED_OUTPATIENT_CLINIC_OR_DEPARTMENT_OTHER): Payer: Self-pay | Admitting: Emergency Medicine

## 2012-02-27 DIAGNOSIS — Z87891 Personal history of nicotine dependence: Secondary | ICD-10-CM | POA: Insufficient documentation

## 2012-02-27 DIAGNOSIS — R1013 Epigastric pain: Secondary | ICD-10-CM | POA: Diagnosis not present

## 2012-02-27 DIAGNOSIS — K219 Gastro-esophageal reflux disease without esophagitis: Secondary | ICD-10-CM | POA: Diagnosis not present

## 2012-02-27 DIAGNOSIS — Z8542 Personal history of malignant neoplasm of other parts of uterus: Secondary | ICD-10-CM | POA: Insufficient documentation

## 2012-02-27 DIAGNOSIS — Z7982 Long term (current) use of aspirin: Secondary | ICD-10-CM | POA: Diagnosis not present

## 2012-02-27 DIAGNOSIS — Z8742 Personal history of other diseases of the female genital tract: Secondary | ICD-10-CM | POA: Diagnosis not present

## 2012-02-27 DIAGNOSIS — Z79899 Other long term (current) drug therapy: Secondary | ICD-10-CM | POA: Diagnosis not present

## 2012-02-27 DIAGNOSIS — Z8619 Personal history of other infectious and parasitic diseases: Secondary | ICD-10-CM | POA: Diagnosis not present

## 2012-02-27 DIAGNOSIS — I10 Essential (primary) hypertension: Secondary | ICD-10-CM | POA: Diagnosis not present

## 2012-02-27 DIAGNOSIS — Z9889 Other specified postprocedural states: Secondary | ICD-10-CM | POA: Insufficient documentation

## 2012-02-27 DIAGNOSIS — Z8739 Personal history of other diseases of the musculoskeletal system and connective tissue: Secondary | ICD-10-CM | POA: Insufficient documentation

## 2012-02-27 DIAGNOSIS — I2510 Atherosclerotic heart disease of native coronary artery without angina pectoris: Secondary | ICD-10-CM | POA: Diagnosis not present

## 2012-02-27 DIAGNOSIS — R1011 Right upper quadrant pain: Secondary | ICD-10-CM | POA: Diagnosis not present

## 2012-02-27 DIAGNOSIS — M549 Dorsalgia, unspecified: Secondary | ICD-10-CM | POA: Insufficient documentation

## 2012-02-27 LAB — LIPASE, BLOOD: Lipase: 47 U/L (ref 11–59)

## 2012-02-27 LAB — URINALYSIS, ROUTINE W REFLEX MICROSCOPIC
Bilirubin Urine: NEGATIVE
Ketones, ur: NEGATIVE mg/dL
Nitrite: NEGATIVE
pH: 5.5 (ref 5.0–8.0)

## 2012-02-27 LAB — HEPATIC FUNCTION PANEL
ALT: 11 U/L (ref 0–35)
AST: 22 U/L (ref 0–37)
Albumin: 4 g/dL (ref 3.5–5.2)
Alkaline Phosphatase: 51 U/L (ref 39–117)
Total Protein: 6.7 g/dL (ref 6.0–8.3)

## 2012-02-27 LAB — BASIC METABOLIC PANEL
BUN: 11 mg/dL (ref 6–23)
CO2: 25 mEq/L (ref 19–32)
Calcium: 9.5 mg/dL (ref 8.4–10.5)
Chloride: 101 mEq/L (ref 96–112)
Creatinine, Ser: 0.9 mg/dL (ref 0.50–1.10)
Glucose, Bld: 95 mg/dL (ref 70–99)

## 2012-02-27 LAB — CBC WITH DIFFERENTIAL/PLATELET
Eosinophils Relative: 3 % (ref 0–5)
HCT: 35.9 % — ABNORMAL LOW (ref 36.0–46.0)
Hemoglobin: 12.2 g/dL (ref 12.0–15.0)
Lymphocytes Relative: 23 % (ref 12–46)
Lymphs Abs: 1.4 10*3/uL (ref 0.7–4.0)
MCV: 89.3 fL (ref 78.0–100.0)
Monocytes Absolute: 0.5 10*3/uL (ref 0.1–1.0)
Monocytes Relative: 8 % (ref 3–12)
Neutro Abs: 4.1 10*3/uL (ref 1.7–7.7)
WBC: 6.2 10*3/uL (ref 4.0–10.5)

## 2012-02-27 MED ORDER — GI COCKTAIL ~~LOC~~
30.0000 mL | Freq: Once | ORAL | Status: AC
  Administered 2012-02-27: 30 mL via ORAL
  Filled 2012-02-27: qty 30

## 2012-02-27 NOTE — ED Notes (Signed)
Patient transported to Xray/US

## 2012-02-27 NOTE — ED Notes (Signed)
Upper abdominal pain since 7am radiates to back.  No chest pain, no sob, no N/V/D.  No known fever.

## 2012-02-27 NOTE — ED Provider Notes (Addendum)
History     CSN: [id number]  Arrival date & time 02/27/12  8295   First MD Initiated Contact with Patient 02/27/12 1011      Chief Complaint  Patient presents with  . Abdominal Pain  . Back Pain    (Consider location/radiation/quality/duration/timing/severity/associated sxs/prior treatment) HPI Comments: Patient presents with acute onset of upper abdominal pain at 7 AM it radiates to her back. Nothing makes the pain better or worse. It is constant. No chest pain, shortness of breath, nausea, vomiting, diarrhea. Denies fever or chills. Denies weakness, numbness or tingling. Is not have panic this in the past. Does have one stent secondary to CAD. Denies any alcohol or new medication use.  The history is provided by the patient.    Past Medical History  Diagnosis Date  . GERD (gastroesophageal reflux disease)   . CAD (coronary artery disease)   . Plantar fasciitis   . DUB (dysfunctional uterine bleeding)   . Hypertension   . Shingles   . Elevated cholesterol   . Cancer     UTERINE    Past Surgical History  Procedure Date  . Inguinal hernia repair   . Carpal tunnel release   . Cardiac stint implant 2008  . Vaginal hysterectomy 2001    LAVH, BSO  . Oophorectomy 2001    LAVH,BSO  . Ulna nerve release 2009  . Appendectomy 2011    Family History  Problem Relation Age of Onset  . Heart attack Mother   . Hypertension Mother   . Heart attack Father   . Heart disease Father   . Heart disease Brother     History  Substance Use Topics  . Smoking status: Former Games developer  . Smokeless tobacco: Not on file  . Alcohol Use: No    OB History    Grav Para Term Preterm Abortions TAB SAB Ect Mult Living   2 2 2   0     2      Review of Systems  Constitutional: Negative for fever, activity change and appetite change.  Respiratory: Negative for cough, chest tightness and shortness of breath.   Cardiovascular: Negative for chest pain.  Gastrointestinal: Positive for  abdominal pain. Negative for nausea, vomiting, diarrhea and constipation.  Musculoskeletal: Positive for back pain.  Skin: Negative for rash.  Neurological: Negative for headaches.    Allergies  Sulfa antibiotics  Home Medications   Current Outpatient Rx  Name  Route  Sig  Dispense  Refill  . CO Q 10 PO   Oral   Take 1 tablet by mouth every morning.         Marland Kitchen DOCUSATE SODIUM 100 MG PO CAPS   Oral   Take 100 mg by mouth 2 (two) times daily.         . ACETAMINOPHEN 500 MG PO TABS   Oral   Take 1,000 mg by mouth every 6 (six) hours as needed. For pain         . ASPIRIN 81 MG PO TABS   Oral   Take 81 mg by mouth daily.           Marland Kitchen CALTRATE 600 PLUS-VIT D PO   Oral   Take 1 tablet by mouth 2 (two) times daily.         Marland Kitchen CETIRIZINE HCL 10 MG PO TABS   Oral   Take 10 mg by mouth daily.           Marland Kitchen VITAMIN D 1000  UNITS PO TABS   Oral   Take 1,000 Units by mouth daily.         Marland Kitchen CLORAZEPATE DIPOTASSIUM 7.5 MG PO TABS   Oral   Take 7.5 mg by mouth once as needed. For nerves         . GLUCOSAMINE-CHONDROITIN 500-400 MG PO TABS   Oral   Take 1 tablet by mouth 2 (two) times daily.          Marland Kitchen LISINOPRIL 20 MG PO TABS   Oral   Take 20 mg by mouth daily.           . NEBIVOLOL HCL 2.5 MG PO TABS   Oral   Take 2.5 mg by mouth daily.           Marland Kitchen PANTOPRAZOLE SODIUM 40 MG PO TBEC   Oral   Take 40 mg by mouth daily.         Marland Kitchen SIMVASTATIN 40 MG PO TABS   Oral   Take 40 mg by mouth at bedtime.             BP 125/60  Pulse 70  Temp 98 F (36.7 C) (Oral)  Resp 18  SpO2 100%  Physical Exam  Constitutional: She appears well-developed and well-nourished. No distress.  HENT:  Head: Normocephalic and atraumatic.  Mouth/Throat: Oropharynx is clear and moist. No oropharyngeal exudate.  Eyes: Conjunctivae normal and EOM are normal. Pupils are equal, round, and reactive to light.  Neck: Normal range of motion. Neck supple.  Cardiovascular:  Normal rate, regular rhythm and normal heart sounds.   No murmur heard.      Equal femoral and DP pulses  Pulmonary/Chest: Effort normal and breath sounds normal. No respiratory distress.  Abdominal: Soft. There is tenderness. There is no rebound and no guarding.       TTP RUQ and epigastrium  Musculoskeletal: Normal range of motion. She exhibits no edema and no tenderness.       No CVAT  Neurological: She is alert. No cranial nerve deficit. She exhibits normal muscle tone. Coordination normal.  Skin: Skin is warm.    ED Course  Procedures (including critical care time)  Labs Reviewed  CBC WITH DIFFERENTIAL - Abnormal; Notable for the following:    HCT 35.9 (*)     All other components within normal limits  BASIC METABOLIC PANEL - Abnormal; Notable for the following:    GFR calc non Af Amer 58 (*)     GFR calc Af Amer 67 (*)     All other components within normal limits  URINALYSIS, ROUTINE W REFLEX MICROSCOPIC  LIPASE, BLOOD  LACTIC ACID, PLASMA  TROPONIN I  HEPATIC FUNCTION PANEL   US Abdomen Complete  02/27/2012  *RADIOLOGY REPORT*  Clinical Data:  Right upper quadrant pain.  ABDOMINAL ULTRASOUND COMPLETE  Comparison:  CT 02/09/2010  Findings:  Gallbladder:  No gallstones, gallbladder wall thickening, or pericholecystic fluid.  Common Bile Duct:  Within normal limits in caliber. Measures 5 mm in diameter.  Liver: No mass identified.  Tiny sub-centimeter cyst noted.  Within normal limits in parenchymal echogenicity.  IVC:  Appears normal.  Pancreas:  No abnormality identified.  Spleen:  Within normal limits in size and echotexture.  Right kidney:  Normal in size and parenchymal echogenicity.  No evidence of mass or hydronephrosis.   Tiny sub-centimeter cyst noted in lower pole.  Left kidney:  Normal in size and parenchymal echogenicity.  No evidence of mass or  hydronephrosis.   1.2 cm cyst noted in the mid pole.  Abdominal Aorta:  No aneurysm identified.  IMPRESSION: No evidence of  gallstones, hydronephrosis, or other significant abnormality.   Original Report Authenticated By: Myles Rosenthal, M.D.    Dg Abd Acute W/chest  02/27/2012  *RADIOLOGY REPORT*  Clinical Data: Epigastric and upper abdominal pain.  ACUTE ABDOMEN SERIES (ABDOMEN 2 VIEW & CHEST 1 VIEW)  Comparison: Chest radiograph on 11/07/2010  Findings: No evidence of dilated bowel loops or free air.  Multiple pelvic phleboliths noted.  No definite radiopaque calculi identified.  The lumbar spine degenerative changes noted.  Heart size is normal.  Both lungs are clear.  No evidence of pleural effusion.  Mild pulmonary hyperinflation again noted.  Old left posterior seventh rib fracture again noted.  IMPRESSION:  1.  Unremarkable bowel gas pattern.  No acute findings. 2.  No active cardiopulmonary disease.   Original Report Authenticated By: Myles Rosenthal, M.D.      1. Epigastric pain       MDM  Upper abdominal tenderness for the past 3 hours. EKG nonischemic. Labs, urinalysis, ultrasound gallbladder. Equal peripheral pulses.  Labs unremarkable. Normal LFTs, normal lipase, EKG nonischemic, negative troponin. Patient states she feels much better and pain has resolved.  Ultrasound shows normal gallbladder. No other abdominal pathology to explain pain.  Tolerating by mouth liquids without difficulty. States her pain is resolved. She remembers eating a spicy meal last night and thinks that maybe responsible. She is already taking protonix.    Date: 02/27/2012  Rate: 52  Rhythm: sinus bradycardia  QRS Axis: normal  Intervals: normal  ST/T Wave abnormalities: normal  Conduction Disutrbances:none  Narrative Interpretation:   Old EKG Reviewed: unchanged    Glynn Octave, MD 02/27/12 1625  Glynn Octave, MD 02/27/12 1626

## 2012-02-27 NOTE — ED Notes (Signed)
EKG was done and shown to Dr. Manus Gunning. An old EKG was pulled from the muse system and also given to the doctor.

## 2012-07-14 ENCOUNTER — Other Ambulatory Visit (HOSPITAL_BASED_OUTPATIENT_CLINIC_OR_DEPARTMENT_OTHER): Payer: Self-pay | Admitting: Internal Medicine

## 2012-07-14 DIAGNOSIS — Z1231 Encounter for screening mammogram for malignant neoplasm of breast: Secondary | ICD-10-CM

## 2012-07-19 DIAGNOSIS — IMO0002 Reserved for concepts with insufficient information to code with codable children: Secondary | ICD-10-CM | POA: Diagnosis not present

## 2012-07-19 DIAGNOSIS — J209 Acute bronchitis, unspecified: Secondary | ICD-10-CM | POA: Diagnosis not present

## 2012-07-19 DIAGNOSIS — R05 Cough: Secondary | ICD-10-CM | POA: Diagnosis not present

## 2012-07-19 DIAGNOSIS — Z1331 Encounter for screening for depression: Secondary | ICD-10-CM | POA: Diagnosis not present

## 2012-07-19 DIAGNOSIS — J309 Allergic rhinitis, unspecified: Secondary | ICD-10-CM | POA: Diagnosis not present

## 2012-07-21 ENCOUNTER — Ambulatory Visit (INDEPENDENT_AMBULATORY_CARE_PROVIDER_SITE_OTHER): Payer: Medicare Other | Admitting: Women's Health

## 2012-07-21 ENCOUNTER — Encounter: Payer: Self-pay | Admitting: Women's Health

## 2012-07-21 VITALS — BP 112/60 | Ht 64.5 in | Wt 136.0 lb

## 2012-07-21 DIAGNOSIS — N952 Postmenopausal atrophic vaginitis: Secondary | ICD-10-CM

## 2012-07-21 NOTE — Progress Notes (Signed)
[name] Feb 07, 1927 [id number]    History:    The patient presents for Breast and pelvic exam. 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 Has had normal Paps since. Normal mammograms history. Numerous health problems hypertension/CAD/hypercholesterolemia/heart disease labs and meds through primary care. Had a cardiac stint 2008. Appendectomy in 2011, hernia repair. Up to date on all vaccinations. Negative colonoscopy.  Exam:  Filed Vitals:   07/21/12 1030  BP: 112/60    General appearance:  Normal Head/Neck:  Normal, without cervical or supraclavicular adenopathy. Thyroid:  Symmetrical, normal in size, without palpable masses or nodularity. Respiratory  Effort:  Normal  Auscultation:  Clear without wheezing or rhonchi Cardiovascular  Auscultation:  Regular rate, without rubs, murmurs or gallops  Edema/varicosities:  Not grossly evident Abdominal  Soft,nontender, without masses, guarding or rebound.  Liver/spleen:  No organomegaly noted  Hernia:  None appreciated  Skin  Inspection:  Grossly normal  Palpation:  Grossly normal Neurologic/psychiatric  Orientation:  Normal with appropriate conversation.  Mood/affect:  Normal  Genitourinary    Breasts: Examined lying and sitting.     Right: Without masses, retractions, discharge or axillary adenopathy.     Left: Without masses, retractions, discharge or axillary adenopathy.   Inguinal/mons:  Normal without inguinal adenopathy  External genitalia:  Normal  BUS/Urethra/Skene's glands:  Normal  Bladder:  Normal  Vagina:  Normal  Cervix:  absent Uterus:  absent  Adnexa/parametria:     Rt: Without masses or tenderness.   Lt: Without masses or tenderness.  Anus and perineum: Normal  Digital rectal exam: Normal sphincter tone without palpated masses or tenderness  Assessment/Plan:  79 y.o. M. WF G2 P2 for breast and pelvic exam.  TVH with BSO 2001 -  adenocarcinoma confined endometrium normal Paps since Numerous health problems hypertension/heart disease/coronary artery disease/hypercholesterolemia primary care labs and meds DEXA primary care manages  Plan: Currently on a Z-Pak for bronchitis per primary care. SBE's, continue annual mammogram, calcium rich diet, vitamin D 2000 daily. Reviewed importance of home safety and fall prevention. Reviewed since she has had normal Paps for greater than 10 years we no longer need to continue.    Harrington Challenger WHNP, 11:05 AM 07/21/2012

## 2012-08-11 ENCOUNTER — Ambulatory Visit (HOSPITAL_BASED_OUTPATIENT_CLINIC_OR_DEPARTMENT_OTHER)
Admission: RE | Admit: 2012-08-11 | Discharge: 2012-08-11 | Disposition: A | Discharge status: Home / Self Care | Payer: Medicare Other | Source: Ambulatory Visit | Attending: Internal Medicine | Admitting: Internal Medicine

## 2012-08-11 DIAGNOSIS — Z1231 Encounter for screening mammogram for malignant neoplasm of breast: Secondary | ICD-10-CM | POA: Insufficient documentation

## 2012-10-12 DIAGNOSIS — L820 Inflamed seborrheic keratosis: Secondary | ICD-10-CM | POA: Diagnosis not present

## 2012-11-20 DIAGNOSIS — I2510 Atherosclerotic heart disease of native coronary artery without angina pectoris: Secondary | ICD-10-CM | POA: Diagnosis not present

## 2012-11-20 DIAGNOSIS — I498 Other specified cardiac arrhythmias: Secondary | ICD-10-CM | POA: Diagnosis not present

## 2012-11-20 DIAGNOSIS — I10 Essential (primary) hypertension: Secondary | ICD-10-CM | POA: Diagnosis not present

## 2012-11-20 DIAGNOSIS — R634 Abnormal weight loss: Secondary | ICD-10-CM | POA: Diagnosis not present

## 2012-11-20 DIAGNOSIS — E7800 Pure hypercholesterolemia, unspecified: Secondary | ICD-10-CM | POA: Diagnosis not present

## 2012-11-20 DIAGNOSIS — Z9861 Coronary angioplasty status: Secondary | ICD-10-CM | POA: Diagnosis not present

## 2012-12-01 DIAGNOSIS — H338 Other retinal detachments: Secondary | ICD-10-CM | POA: Diagnosis not present

## 2012-12-01 DIAGNOSIS — H43819 Vitreous degeneration, unspecified eye: Secondary | ICD-10-CM | POA: Diagnosis not present

## 2012-12-01 DIAGNOSIS — H35379 Puckering of macula, unspecified eye: Secondary | ICD-10-CM | POA: Diagnosis not present

## 2012-12-07 DIAGNOSIS — H338 Other retinal detachments: Secondary | ICD-10-CM | POA: Diagnosis not present

## 2012-12-07 DIAGNOSIS — H3320 Serous retinal detachment, unspecified eye: Secondary | ICD-10-CM | POA: Diagnosis not present

## 2012-12-07 DIAGNOSIS — H35379 Puckering of macula, unspecified eye: Secondary | ICD-10-CM | POA: Diagnosis not present

## 2012-12-08 DIAGNOSIS — H338 Other retinal detachments: Secondary | ICD-10-CM | POA: Diagnosis not present

## 2012-12-26 ENCOUNTER — Telehealth: Payer: Self-pay | Admitting: Cardiology

## 2012-12-26 NOTE — Telephone Encounter (Signed)
New message    Off bystolic and pt can feel heart beating fast with exertion----not all of the time

## 2012-12-27 NOTE — Telephone Encounter (Signed)
Let us continue to monitor this. Stay off of Bystolic 2.5mg  because of significant bradycardia seen in clinic, heart rate was 39 beats per minute on EKG. I'm fine with her having some elevation in her heart rate during exercise.

## 2012-12-28 NOTE — Telephone Encounter (Signed)
Spoke with patient advised to stay off Bystolic 2.5mg   because of significant bradycardia seen in clinic, heart rate was 39 beats per minute on EKG. Dr. Anne Fu is fine with her having some elevation in her heart rate during exercise. Patient verbalized understanding.

## 2013-01-09 DIAGNOSIS — H35379 Puckering of macula, unspecified eye: Secondary | ICD-10-CM | POA: Diagnosis not present

## 2013-02-01 DIAGNOSIS — Z23 Encounter for immunization: Secondary | ICD-10-CM | POA: Diagnosis not present

## 2013-02-13 ENCOUNTER — Ambulatory Visit (HOSPITAL_BASED_OUTPATIENT_CLINIC_OR_DEPARTMENT_OTHER)
Admission: RE | Admit: 2013-02-13 | Discharge: 2013-02-13 | Disposition: A | Discharge status: Home / Self Care | Payer: Medicare Other | Source: Ambulatory Visit | Attending: Family Medicine | Admitting: Family Medicine

## 2013-02-13 ENCOUNTER — Ambulatory Visit (INDEPENDENT_AMBULATORY_CARE_PROVIDER_SITE_OTHER): Payer: Medicare Other | Admitting: Family Medicine

## 2013-02-13 ENCOUNTER — Encounter: Payer: Self-pay | Admitting: Family Medicine

## 2013-02-13 VITALS — BP 102/68 | HR 69 | Ht 64.0 in | Wt 127.0 lb

## 2013-02-13 DIAGNOSIS — S99912A Unspecified injury of left ankle, initial encounter: Secondary | ICD-10-CM

## 2013-02-13 DIAGNOSIS — M25579 Pain in unspecified ankle and joints of unspecified foot: Secondary | ICD-10-CM | POA: Insufficient documentation

## 2013-02-13 DIAGNOSIS — S8990XA Unspecified injury of unspecified lower leg, initial encounter: Secondary | ICD-10-CM | POA: Insufficient documentation

## 2013-02-13 DIAGNOSIS — X58XXXA Exposure to other specified factors, initial encounter: Secondary | ICD-10-CM | POA: Insufficient documentation

## 2013-02-13 NOTE — Patient Instructions (Addendum)
You have an ankle sprain. Ice the area for 15 minutes at a time, 3-4 times a day Aleve 2 tabs twice a day with food OR ibuprofen 3 tabs three times a day with food for pain and inflammation. Elevate above the level of your heart when possible Crutches if needed to help with walking Bear weight when tolerated Use laceup ankle brace to help with stability while you recover from this injury. Come out of the boot/brace twice a day to do Up/down and alphabet exercises 2-3 sets of each. Start theraband strengthening exercises in 2 weeks - once a day 3 sets of 10. Consider physical therapy for strengthening and balance exercises. Follow up in 3 weeks for reevaluation.

## 2013-02-14 ENCOUNTER — Encounter: Payer: Self-pay | Admitting: Family Medicine

## 2013-02-14 DIAGNOSIS — S99912A Unspecified injury of left ankle, initial encounter: Secondary | ICD-10-CM | POA: Insufficient documentation

## 2013-02-14 NOTE — Assessment & Plan Note (Signed)
Left ankle sprain - radiographs show possible tiny avulsion fragment but not tender at this location.  Treat as ankle sprain - icing, tylenol, nsaids, elevation.  ASO for stability.  Start home ROM exercises.  In 2 weeks start theraband strengthening.  F/u in 3 weeks. Consider formal PT if not improving.

## 2013-02-14 NOTE — Progress Notes (Signed)
Patient ID: [name], female   DOB: 31-Oct-1926, 79 y.o.   MRN: [id number]  PCP: Kari Baars, MD  Subjective:   HPI: Patient is a 79 y.o. female here for left ankle injury.  Patient reports on 11/23 she had been sitting for a long time. Went to stand up and accidentally rolled ankle walking across the floor. No LOC, dizziness. Did not seek care. Able to bear weight after this though was painful. Iced and wrapped ankle.  Elevated too. Bruising and swelling. Pain worse at night. No known injuries previously.  Past Medical History  Diagnosis Date  . GERD (gastroesophageal reflux disease)   . CAD (coronary artery disease)   . Plantar fasciitis   . DUB (dysfunctional uterine bleeding)   . Hypertension   . Shingles   . Elevated cholesterol   . Cancer     UTERINE    Current Outpatient Prescriptions on File Prior to Visit  Medication Sig Dispense Refill  . acetaminophen (TYLENOL) 500 MG tablet Take 1,000 mg by mouth every 6 (six) hours as needed. For pain      . aspirin 81 MG tablet Take 81 mg by mouth daily.        . Calcium-Vitamin D (CALTRATE 600 PLUS-VIT D PO) Take 1 tablet by mouth 2 (two) times daily.      . cetirizine (ZYRTEC) 10 MG tablet Take 10 mg by mouth daily.        . cholecalciferol (VITAMIN D) 1000 UNITS tablet Take 1,000 Units by mouth daily.      . clorazepate (TRANXENE) 7.5 MG tablet Take 7.5 mg by mouth once as needed. For nerves      . Coenzyme Q10 (CO Q 10 PO) Take 1 tablet by mouth every morning.      . docusate sodium (COLACE) 100 MG capsule Take 100 mg by mouth 2 (two) times daily.      Marland Kitchen glucosamine-chondroitin 500-400 MG tablet Take 1 tablet by mouth 2 (two) times daily.       Marland Kitchen lisinopril (PRINIVIL,ZESTRIL) 20 MG tablet Take 20 mg by mouth daily.        . nebivolol (BYSTOLIC) 2.5 MG tablet Take 2.5 mg by mouth daily.        . simvastatin (ZOCOR) 40 MG tablet Take 40 mg by mouth at bedtime.         No current facility-administered medications on  file prior to visit.    Past Surgical History  Procedure Laterality Date  . Inguinal hernia repair    . Carpal tunnel release    . Cardiac stint implant  2008  . Vaginal hysterectomy  2001    LAVH, BSO  . Oophorectomy  2001    LAVH,BSO  . Ulna nerve release  2009  . Appendectomy  2011    Allergies  Allergen Reactions  . Sulfa Antibiotics Nausea And Vomiting    History   Social History  . Marital Status: Married    Spouse Name: N/A    Number of Children: N/A  . Years of Education: N/A   Occupational History  . Not on file.   Social History Main Topics  . Smoking status: Former Games developer  . Smokeless tobacco: Not on file  . Alcohol Use: No  . Drug Use: No  . Sexual Activity: No   Other Topics Concern  . Not on file   Social History Narrative  . No narrative on file    Family History  Problem Relation Age  of Onset  . Heart attack Mother   . Hypertension Mother   . Heart attack Father   . Heart disease Father   . Heart disease Brother     BP 102/68  Pulse 69  Ht 5\' 4"  (1.626 m)  Wt 127 lb (57.607 kg)  BMI 21.79 kg/m2  Review of Systems: See HPI above.    Objective:  Physical Exam:  Gen: NAD  Left ankle/foot: Mild lateral ankle swelling.  Bruising here down to toes.  No other deformity. Mild limitation ROM all directions. TTP greatest ATFL and lateral malleolus (but not at tip of malleolus).  No base 5th, medial malleolus, navicular, other ankle, fibular head tenderness. 1+ painful ant drawer and talar tilt.   Negative syndesmotic compression. Thompsons test negative. NV intact distally.    Assessment & Plan:  1. Left ankle sprain - radiographs show possible tiny avulsion fragment but not tender at this location.  Treat as ankle sprain - icing, tylenol, nsaids, elevation.  ASO for stability.  Start home ROM exercises.  In 2 weeks start theraband strengthening.  F/u in 3 weeks. Consider formal PT if not improving.

## 2013-03-02 ENCOUNTER — Encounter: Payer: Self-pay | Admitting: Family Medicine

## 2013-03-02 ENCOUNTER — Ambulatory Visit (INDEPENDENT_AMBULATORY_CARE_PROVIDER_SITE_OTHER): Payer: Medicare Other | Admitting: Family Medicine

## 2013-03-02 VITALS — BP 128/72 | HR 61 | Ht 64.0 in | Wt 127.0 lb

## 2013-03-02 DIAGNOSIS — Z5189 Encounter for other specified aftercare: Secondary | ICD-10-CM

## 2013-03-02 DIAGNOSIS — S8990XA Unspecified injury of unspecified lower leg, initial encounter: Secondary | ICD-10-CM | POA: Diagnosis not present

## 2013-03-02 DIAGNOSIS — S99912D Unspecified injury of left ankle, subsequent encounter: Secondary | ICD-10-CM

## 2013-03-04 ENCOUNTER — Encounter: Payer: Self-pay | Admitting: Family Medicine

## 2013-03-04 NOTE — Progress Notes (Signed)
Patient ID: [name], female   DOB: 17-Apr-1926, 79 y.o.   MRN: [id number]  PCP: Kari Baars, MD  Subjective:   HPI: Patient is a 79 y.o. female here for left ankle injury.  12/2: Patient reports on 11/23 she had been sitting for a long time. Went to stand up and accidentally rolled ankle walking across the floor. No LOC, dizziness. Did not seek care. Able to bear weight after this though was painful. Iced and wrapped ankle.  Elevated too. Bruising and swelling. Pain worse at night. No known injuries previously.  12/9: Patient doing very well without complaints. Using ASO occasionally. Doing HEP. No pain.  Past Medical History  Diagnosis Date  . GERD (gastroesophageal reflux disease)   . CAD (coronary artery disease)   . Plantar fasciitis   . DUB (dysfunctional uterine bleeding)   . Hypertension   . Shingles   . Elevated cholesterol   . Cancer     UTERINE    Current Outpatient Prescriptions on File Prior to Visit  Medication Sig Dispense Refill  . acetaminophen (TYLENOL) 500 MG tablet Take 1,000 mg by mouth every 6 (six) hours as needed. For pain      . aspirin 81 MG tablet Take 81 mg by mouth daily.        . Calcium-Vitamin D (CALTRATE 600 PLUS-VIT D PO) Take 1 tablet by mouth 2 (two) times daily.      . cetirizine (ZYRTEC) 10 MG tablet Take 10 mg by mouth daily.        . cholecalciferol (VITAMIN D) 1000 UNITS tablet Take 1,000 Units by mouth daily.      . clorazepate (TRANXENE) 7.5 MG tablet Take 7.5 mg by mouth once as needed. For nerves      . Coenzyme Q10 (CO Q 10 PO) Take 1 tablet by mouth every morning.      . docusate sodium (COLACE) 100 MG capsule Take 100 mg by mouth 2 (two) times daily.      Marland Kitchen glucosamine-chondroitin 500-400 MG tablet Take 1 tablet by mouth 2 (two) times daily.       Marland Kitchen lisinopril (PRINIVIL,ZESTRIL) 20 MG tablet Take 20 mg by mouth daily.        . nebivolol (BYSTOLIC) 2.5 MG tablet Take 2.5 mg by mouth daily.        . simvastatin  (ZOCOR) 40 MG tablet Take 40 mg by mouth at bedtime.         No current facility-administered medications on file prior to visit.    Past Surgical History  Procedure Laterality Date  . Inguinal hernia repair    . Carpal tunnel release    . Cardiac stint implant  2008  . Vaginal hysterectomy  2001    LAVH, BSO  . Oophorectomy  2001    LAVH,BSO  . Ulna nerve release  2009  . Appendectomy  2011    Allergies  Allergen Reactions  . Sulfa Antibiotics Nausea And Vomiting    History   Social History  . Marital Status: Married    Spouse Name: N/A    Number of Children: N/A  . Years of Education: N/A   Occupational History  . Not on file.   Social History Main Topics  . Smoking status: Former Games developer  . Smokeless tobacco: Not on file  . Alcohol Use: No  . Drug Use: No  . Sexual Activity: No   Other Topics Concern  . Not on file   Social History  Narrative  . No narrative on file    Family History  Problem Relation Age of Onset  . Heart attack Mother   . Hypertension Mother   . Heart attack Father   . Heart disease Father   . Heart disease Brother     BP 128/72  Pulse 61  Ht 5\' 4"  (1.626 m)  Wt 127 lb (57.607 kg)  BMI 21.79 kg/m2  Review of Systems: See HPI above.    Objective:  Physical Exam:  Gen: NAD  Left ankle/foot: Mild lateral ankle swelling.  No bruising, other deformity. FROM. No TTP 1+ ant drawer and talar tilt, no pain.   Negative syndesmotic compression. Thompsons test negative. NV intact distally.    Assessment & Plan:  1. Left ankle sprain - radiographs show possible tiny avulsion fragment but not tender at this location.  Much better with HEP, time.  Advised to continue with HEP another 3-4 weeks.  ASO as needed when on long walks, irregular ground.  F/u prn.

## 2013-03-04 NOTE — Assessment & Plan Note (Signed)
Left ankle sprain - radiographs show possible tiny avulsion fragment but not tender at this location.  Much better with HEP, time.  Advised to continue with HEP another 3-4 weeks.  ASO as needed when on long walks, irregular ground.  F/u prn.

## 2013-03-12 DIAGNOSIS — H35379 Puckering of macula, unspecified eye: Secondary | ICD-10-CM | POA: Diagnosis not present

## 2013-03-13 DIAGNOSIS — M899 Disorder of bone, unspecified: Secondary | ICD-10-CM | POA: Diagnosis not present

## 2013-03-13 DIAGNOSIS — I10 Essential (primary) hypertension: Secondary | ICD-10-CM | POA: Diagnosis not present

## 2013-03-13 DIAGNOSIS — R82998 Other abnormal findings in urine: Secondary | ICD-10-CM | POA: Diagnosis not present

## 2013-03-13 DIAGNOSIS — E785 Hyperlipidemia, unspecified: Secondary | ICD-10-CM | POA: Diagnosis not present

## 2013-03-21 DIAGNOSIS — J309 Allergic rhinitis, unspecified: Secondary | ICD-10-CM | POA: Diagnosis not present

## 2013-03-21 DIAGNOSIS — Z0000 Encounter for general adult medical examination without abnormal findings: Secondary | ICD-10-CM | POA: Diagnosis not present

## 2013-03-21 DIAGNOSIS — M949 Disorder of cartilage, unspecified: Secondary | ICD-10-CM | POA: Diagnosis not present

## 2013-03-21 DIAGNOSIS — M899 Disorder of bone, unspecified: Secondary | ICD-10-CM | POA: Diagnosis not present

## 2013-03-21 DIAGNOSIS — IMO0002 Reserved for concepts with insufficient information to code with codable children: Secondary | ICD-10-CM | POA: Diagnosis not present

## 2013-03-21 DIAGNOSIS — I10 Essential (primary) hypertension: Secondary | ICD-10-CM | POA: Diagnosis not present

## 2013-03-21 DIAGNOSIS — E785 Hyperlipidemia, unspecified: Secondary | ICD-10-CM | POA: Diagnosis not present

## 2013-03-21 DIAGNOSIS — R1904 Left lower quadrant abdominal swelling, mass and lump: Secondary | ICD-10-CM | POA: Diagnosis not present

## 2013-03-21 DIAGNOSIS — I259 Chronic ischemic heart disease, unspecified: Secondary | ICD-10-CM | POA: Diagnosis not present

## 2013-03-27 ENCOUNTER — Other Ambulatory Visit: Payer: Self-pay | Admitting: Internal Medicine

## 2013-03-27 DIAGNOSIS — R1904 Left lower quadrant abdominal swelling, mass and lump: Secondary | ICD-10-CM

## 2013-03-30 ENCOUNTER — Other Ambulatory Visit (HOSPITAL_BASED_OUTPATIENT_CLINIC_OR_DEPARTMENT_OTHER): Payer: Self-pay | Admitting: Internal Medicine

## 2013-03-30 ENCOUNTER — Other Ambulatory Visit: Payer: Self-pay | Admitting: Internal Medicine

## 2013-03-30 DIAGNOSIS — R1904 Left lower quadrant abdominal swelling, mass and lump: Secondary | ICD-10-CM

## 2013-04-02 ENCOUNTER — Encounter (HOSPITAL_BASED_OUTPATIENT_CLINIC_OR_DEPARTMENT_OTHER): Payer: Self-pay

## 2013-04-02 ENCOUNTER — Other Ambulatory Visit: Payer: Medicare Other

## 2013-04-02 ENCOUNTER — Ambulatory Visit (HOSPITAL_BASED_OUTPATIENT_CLINIC_OR_DEPARTMENT_OTHER)
Admission: RE | Admit: 2013-04-02 | Discharge: 2013-04-02 | Disposition: A | Discharge status: Home / Self Care | Payer: Medicare Other | Source: Ambulatory Visit | Attending: Internal Medicine | Admitting: Internal Medicine

## 2013-04-02 DIAGNOSIS — R109 Unspecified abdominal pain: Secondary | ICD-10-CM | POA: Diagnosis not present

## 2013-04-02 DIAGNOSIS — K5730 Diverticulosis of large intestine without perforation or abscess without bleeding: Secondary | ICD-10-CM | POA: Diagnosis not present

## 2013-04-02 DIAGNOSIS — R1909 Other intra-abdominal and pelvic swelling, mass and lump: Secondary | ICD-10-CM | POA: Insufficient documentation

## 2013-04-02 DIAGNOSIS — R1904 Left lower quadrant abdominal swelling, mass and lump: Secondary | ICD-10-CM

## 2013-04-02 MED ORDER — IOHEXOL 300 MG/ML  SOLN
100.0000 mL | Freq: Once | INTRAMUSCULAR | Status: AC | PRN
  Administered 2013-04-02: 100 mL via INTRAVENOUS

## 2013-04-04 DIAGNOSIS — Z23 Encounter for immunization: Secondary | ICD-10-CM | POA: Diagnosis not present

## 2013-05-04 DIAGNOSIS — H3581 Retinal edema: Secondary | ICD-10-CM | POA: Diagnosis not present

## 2013-05-04 DIAGNOSIS — H43819 Vitreous degeneration, unspecified eye: Secondary | ICD-10-CM | POA: Diagnosis not present

## 2013-05-04 DIAGNOSIS — H35379 Puckering of macula, unspecified eye: Secondary | ICD-10-CM | POA: Diagnosis not present

## 2013-06-21 DIAGNOSIS — H04129 Dry eye syndrome of unspecified lacrimal gland: Secondary | ICD-10-CM | POA: Diagnosis not present

## 2013-06-21 DIAGNOSIS — H40019 Open angle with borderline findings, low risk, unspecified eye: Secondary | ICD-10-CM | POA: Diagnosis not present

## 2013-07-05 DIAGNOSIS — H04129 Dry eye syndrome of unspecified lacrimal gland: Secondary | ICD-10-CM | POA: Diagnosis not present

## 2013-07-13 ENCOUNTER — Other Ambulatory Visit (HOSPITAL_BASED_OUTPATIENT_CLINIC_OR_DEPARTMENT_OTHER): Payer: Self-pay | Admitting: Internal Medicine

## 2013-07-13 DIAGNOSIS — Z1231 Encounter for screening mammogram for malignant neoplasm of breast: Secondary | ICD-10-CM

## 2013-07-24 ENCOUNTER — Encounter: Payer: Medicare Other | Admitting: Women's Health

## 2013-08-07 ENCOUNTER — Ambulatory Visit (INDEPENDENT_AMBULATORY_CARE_PROVIDER_SITE_OTHER): Payer: Medicare Other | Admitting: Women's Health

## 2013-08-07 ENCOUNTER — Encounter: Payer: Self-pay | Admitting: Women's Health

## 2013-08-07 VITALS — BP 120/70 | Ht 63.75 in | Wt 133.0 lb

## 2013-08-07 DIAGNOSIS — C801 Malignant (primary) neoplasm, unspecified: Secondary | ICD-10-CM | POA: Diagnosis not present

## 2013-08-07 DIAGNOSIS — N952 Postmenopausal atrophic vaginitis: Secondary | ICD-10-CM

## 2013-08-07 NOTE — Progress Notes (Addendum)
[name] [birth year] [id number]    History:    Presents for breast and pelvic exam. 2001 LAVH with BSO for uterine cancer (stage 1 confined to endometrium) Dr [name] with normal Paps after. Normal mammogram history. Declines bone density. Hypertension/hypercholesterolemia/coronary artery disease, had a cardiac stent 2008 primary care /cardiologist manage. Negative colonoscopy 2011. Vaccines current. Not sexually active/husband poor health.  Past medical history, past surgical history, family history and social history were all reviewed and documented in the EPIC chart. Husband in poor health helps care for him. Delivers mobile meals, knits and active in the community.  ROS:  A  12 point ROS was performed and pertinent positives and negatives are included.  Exam:  Filed Vitals:   08/07/13 1357  BP: 120/70    General appearance:  Normal Thyroid:  Symmetrical, normal in size, without palpable masses or nodularity. Respiratory  Auscultation:  Clear without wheezing or rhonchi Cardiovascular  Auscultation:  Regular rate, without rubs, murmurs or gallops  Edema/varicosities:  Not grossly evident Abdominal  Soft,nontender, without masses, guarding or rebound.  Liver/spleen:  No organomegaly noted  Hernia:  None appreciated  Skin  Inspection:  Grossly normal   Breasts: Examined lying and sitting.     Right: Without masses, retractions, discharge or axillary adenopathy.     Left: Without masses, retractions, discharge or axillary adenopathy. Gentitourinary   Inguinal/mons:  Normal without inguinal adenopathy  External genitalia:  Normal  BUS/Urethra/Skene's glands:  Normal  Vagina:  Atrophic  Cervix:  Absent  Uterus: Absent Adnexa/parametria:     Rt: Without masses or tenderness.   Lt: Without masses or tenderness.  Anus and perineum: Normal  Digital rectal exam: Normal sphincter tone without palpated masses or tenderness  Assessment/Plan:  80 y.o.MWF G2P2  for breast and  pelvic exam.  2001 LAVH with BSO for uterine cancer with normal Paps after CAD/hypertension/hypercholesterolemia primary care/ cardiologist manage meds and labs Atrophic vaginitis-not sexually active/ asymptomatic  Plan: SBE's, continue annual mammogram, calcium rich diet, regular exercise. Reviewed importance of home safety and ball prevention. Pap normal greater than 10 years after uterine cancer diagnosis, Pap not done, guidelines reviewed. Continue healthy diet, active lifestyle.    Note: This dictation was prepared with Dragon/digital dictation.  Any transcriptional errors that result are unintentional. [name] [facility], 2:35 PM 08/07/2013

## 2013-08-07 NOTE — Patient Instructions (Signed)
Health Recommendations for Postmenopausal Women Respected and ongoing research has looked at the most common causes of death, disability, and poor quality of life in postmenopausal women. The causes include heart disease, diseases of blood vessels, diabetes, depression, cancer, and bone loss (osteoporosis). Many things can be done to help lower the chances of developing these and other common problems: CARDIOVASCULAR DISEASE Heart Disease: A heart attack is a medical emergency. Know the signs and symptoms of a heart attack. Below are things women can do to reduce their risk for heart disease.   Do not smoke. If you smoke, quit.  Aim for a healthy weight. Being overweight causes many preventable deaths. Eat a healthy and balanced diet and drink an adequate amount of liquids.  Get moving. Make a commitment to be more physically active. Aim for 30 minutes of activity on most, if not all days of the week.  Eat for heart health. Choose a diet that is low in saturated fat and cholesterol and eliminate trans fat. Include whole grains, vegetables, and fruits. Read and understand the labels on food containers before buying.  Know your numbers. Ask your caregiver to check your blood pressure, cholesterol (total, HDL, LDL, triglycerides) and blood glucose. Work with your caregiver on improving your entire clinical picture.  High blood pressure. Limit or stop your table salt intake (try salt substitute and food seasonings). Avoid salty foods and drinks. Read labels on food containers before buying. Eating well and exercising can help control high blood pressure. STROKE  Stroke is a medical emergency. Stroke may be the result of a blood clot in a blood vessel in the brain or by a brain hemorrhage (bleeding). Know the signs and symptoms of a stroke. To lower the risk of developing a stroke:  Avoid fatty foods.  Quit smoking.  Control your diabetes, blood pressure, and irregular heart rate. THROMBOPHLEBITIS  (BLOOD CLOT) OF THE LEG  Becoming overweight and leading a stationary lifestyle may also contribute to developing blood clots. Controlling your diet and exercising will help lower the risk of developing blood clots. CANCER SCREENING  Breast Cancer: Take steps to reduce your risk of breast cancer.  You should practice "breast self-awareness." This means understanding the normal appearance and feel of your breasts and should include breast self-examination. Any changes detected, no matter how small, should be reported to your caregiver.  After age 40, you should have a clinical breast exam (CBE) every year.  Starting at age 40, you should consider having a mammogram (breast X-ray) every year.  If you have a family history of breast cancer, talk to your caregiver about genetic screening.  If you are at high risk for breast cancer, talk to your caregiver about having an MRI and a mammogram every year.  Intestinal or Stomach Cancer: Tests to consider are a rectal exam, fecal occult blood, sigmoidoscopy, and colonoscopy. Women who are high risk may need to be screened at an earlier age and more often.  Cervical Cancer:  Beginning at age 30, you should have a Pap test every 3 years as long as the past 3 Pap tests have been normal.  If you have had past treatment for cervical cancer or a condition that could lead to cancer, you need Pap tests and screening for cancer for at least 20 years after your treatment.  If you had a hysterectomy for a problem that was not cancer or a condition that could lead to cancer, then you no longer need Pap tests.    If you are between ages 65 and 70, and you have had normal Pap tests going back 10 years, you no longer need Pap tests.  If Pap tests have been discontinued, risk factors (such as a new sexual partner) need to be reassessed to determine if screening should be resumed.  Some medical problems can increase the chance of getting cervical cancer. In these  cases, your caregiver may recommend more frequent screening and Pap tests.  Uterine Cancer: If you have vaginal bleeding after reaching menopause, you should notify your caregiver.  Ovarian cancer: Other than yearly pelvic exams, there are no reliable tests available to screen for ovarian cancer at this time except for yearly pelvic exams.  Lung Cancer: Yearly chest X-rays can detect lung cancer and should be done on high risk women, such as cigarette smokers and women with chronic lung disease (emphysema).  Skin Cancer: A complete body skin exam should be done at your yearly examination. Avoid overexposure to the sun and ultraviolet light lamps. Use a strong sun block cream when in the sun. All of these things are important in lowering the risk of skin cancer. MENOPAUSE Menopause Symptoms: Hormone therapy products are effective for treating symptoms associated with menopause:  Moderate to severe hot flashes.  Night sweats.  Mood swings.  Headaches.  Tiredness.  Loss of sex drive.  Insomnia.  Other symptoms. Hormone replacement carries certain risks, especially in older women. Women who use or are thinking about using estrogen or estrogen with progestin treatments should discuss that with their caregiver. Your caregiver will help you understand the benefits and risks. The ideal dose of hormone replacement therapy is not known. The Food and Drug Administration (FDA) has concluded that hormone therapy should be used only at the lowest doses and for the shortest amount of time to reach treatment goals.  OSTEOPOROSIS Protecting Against Bone Loss and Preventing Fracture: If you use hormone therapy for prevention of bone loss (osteoporosis), the risks for bone loss must outweigh the risk of the therapy. Ask your caregiver about other medications known to be safe and effective for preventing bone loss and fractures. To guard against bone loss or fractures, the following is recommended:  If  you are less than age 50, take 1000 mg of calcium and at least 600 mg of Vitamin D per day.  If you are greater than age 50 but less than age 70, take 1200 mg of calcium and at least 600 mg of Vitamin D per day.  If you are greater than age 70, take 1200 mg of calcium and at least 800 mg of Vitamin D per day. Smoking and excessive alcohol intake increases the risk of osteoporosis. Eat foods rich in calcium and vitamin D and do weight bearing exercises several times a week as your caregiver suggests. DIABETES Diabetes Melitus: If you have Type I or Type 2 diabetes, you should keep your blood sugar under control with diet, exercise and recommended medication. Avoid too many sweets, starchy and fatty foods. Being overweight can make control more difficult. COGNITION AND MEMORY Cognition and Memory: Menopausal hormone therapy is not recommended for the prevention of cognitive disorders such as Alzheimer's disease or memory loss.  DEPRESSION  Depression may occur at any age, but is common in elderly women. The reasons may be because of physical, medical, social (loneliness), or financial problems and needs. If you are experiencing depression because of medical problems and control of symptoms, talk to your caregiver about this. Physical activity and   exercise may help with mood and sleep. Community and volunteer involvement may help your sense of value and worth. If you have depression and you feel that the problem is getting worse or becoming severe, talk to your caregiver about treatment options that are best for you. ACCIDENTS  Accidents are common and can be serious in the elderly woman. Prepare your house to prevent accidents. Eliminate throw rugs, place hand bars in the bath, shower and toilet areas. Avoid wearing high heeled shoes or walking on wet, snowy, and icy areas. Limit or stop driving if you have vision or hearing problems, or you feel you are unsteady with you movements and  reflexes. HEPATITIS C Hepatitis C is a type of viral infection affecting the liver. It is spread mainly through contact with blood from an infected person. It can be treated, but if left untreated, it can lead to severe liver damage over years. Many people who are infected do not know that the virus is in their blood. If you are a "baby-boomer", it is recommended that you have one screening test for Hepatitis C. IMMUNIZATIONS  Several immunizations are important to consider having during your senior years, including:   Tetanus, diptheria, and pertussis booster shot.  Influenza every year before the flu season begins.  Pneumonia vaccine.  Shingles vaccine.  Others as indicated based on your specific needs. Talk to your caregiver about these. Document Released: 04/23/2005 Document Revised: 02/16/2012 Document Reviewed: 12/18/2007 [employer] Patient Information 2014 [employer].  

## 2013-08-20 ENCOUNTER — Ambulatory Visit (HOSPITAL_BASED_OUTPATIENT_CLINIC_OR_DEPARTMENT_OTHER)
Admission: RE | Admit: 2013-08-20 | Discharge: 2013-08-20 | Disposition: A | Discharge status: Home / Self Care | Payer: Medicare Other | Source: Ambulatory Visit | Attending: Internal Medicine | Admitting: Internal Medicine

## 2013-08-20 DIAGNOSIS — Z1231 Encounter for screening mammogram for malignant neoplasm of breast: Secondary | ICD-10-CM | POA: Diagnosis not present

## 2013-08-30 ENCOUNTER — Encounter: Payer: Self-pay | Admitting: *Deleted

## 2013-09-05 ENCOUNTER — Ambulatory Visit (INDEPENDENT_AMBULATORY_CARE_PROVIDER_SITE_OTHER): Payer: Medicare Other | Admitting: Gynecology

## 2013-09-05 ENCOUNTER — Encounter: Payer: Self-pay | Admitting: Gynecology

## 2013-09-05 VITALS — BP 134/74

## 2013-09-05 DIAGNOSIS — N6459 Other signs and symptoms in breast: Secondary | ICD-10-CM | POA: Diagnosis not present

## 2013-09-05 NOTE — Progress Notes (Signed)
   80 year old patient who presented to the office with concerns of possible right nipple inversion. Patient with past history in 2001 of laparoscopic-assisted vaginal hysterectomy with bilateral salpingo-oophorectomy as a result of endometrial cancer stage I. Patient 2 weeks ago had a normal mammogram with the exception that description of extremely dense breast was noted. Patient with no first-line relative other than to first cousins with history of breast cancer. She denied any nipple discharge or any discoloration or having palpate any masses.  Breasts exam: Both breasts were examined sitting supine position. Both breasts are symmetrical in appearance there was no palpable masses no supraclavicular axillary lymphadenopathy right nipple slightly inverted able to be everted.  Assessment/plan: Slightly inverted right nipple. Recent normal mammogram. No discoloration and no palpable masses no supraclavicular axillary lymphadenopathy. The patient was reassured. I've asked the patient to return back to the office in 6 months for followup breast exam. She should continue to do her monthly breast exam as well.

## 2013-09-05 NOTE — Patient Instructions (Signed)

## 2013-10-30 DIAGNOSIS — H35379 Puckering of macula, unspecified eye: Secondary | ICD-10-CM | POA: Diagnosis not present

## 2013-10-30 DIAGNOSIS — H43819 Vitreous degeneration, unspecified eye: Secondary | ICD-10-CM | POA: Diagnosis not present

## 2013-11-22 ENCOUNTER — Encounter: Payer: Self-pay | Admitting: Cardiology

## 2013-11-22 ENCOUNTER — Ambulatory Visit (INDEPENDENT_AMBULATORY_CARE_PROVIDER_SITE_OTHER): Payer: Medicare Other | Admitting: Cardiology

## 2013-11-22 VITALS — BP 128/72 | HR 60 | Ht 64.0 in | Wt 134.0 lb

## 2013-11-22 DIAGNOSIS — I2510 Atherosclerotic heart disease of native coronary artery without angina pectoris: Secondary | ICD-10-CM | POA: Diagnosis not present

## 2013-11-22 DIAGNOSIS — I10 Essential (primary) hypertension: Secondary | ICD-10-CM | POA: Diagnosis not present

## 2013-11-22 DIAGNOSIS — E7800 Pure hypercholesterolemia, unspecified: Secondary | ICD-10-CM

## 2013-11-22 NOTE — Patient Instructions (Signed)
The current medical regimen is effective;  continue present plan and medications.  Follow up in 1 year with Dr Skains.  You will receive a letter in the mail 2 months before you are due.  Please call us when you receive this letter to schedule your follow up appointment.  

## 2013-11-22 NOTE — Progress Notes (Signed)
[facility]. [address], [address],   [address] Phone: [phone] Fax:  [phone]  Date:  11/22/2013   ID:  [name], DOB 1926/09/02, MRN [id number]  PCP:  Marton Redwood, MD   History of Present Illness: [name] is a 80 y.o. female with coronary artery disease following DES to proximal RCA in August of 2008 after presenting with typical angina, followup stress test in 2009 showed no ischemia. She has also had cutting balloon to distal RCA/posterior lateral branch.  Stress Cardiolyte, normal with no inducible ischemia, EF 85% Normal max effort GXT, 05/2007       Wt Readings from Last 3 Encounters:  11/22/13 134 lb (60.782 kg)  08/07/13 133 lb (60.328 kg)  03/02/13 127 lb (57.607 kg)     Past Medical History  Diagnosis Date  . GERD (gastroesophageal reflux disease)   . CAD (coronary artery disease)   . Plantar fasciitis   . DUB (dysfunctional uterine bleeding)   . Hypertension   . Shingles   . Elevated cholesterol   . Cancer     UTERINE    Past Surgical History  Procedure Laterality Date  . Inguinal hernia repair    . Carpal tunnel release    . Cardiac stint implant  2008  . Vaginal hysterectomy  2001    LAVH, BSO  . Oophorectomy  2001    LAVH,BSO  . Ulna nerve release  2009  . Appendectomy  2011  . Eye surgery      Current Outpatient Prescriptions  Medication Sig Dispense Refill  . acetaminophen (TYLENOL) 500 MG tablet Take 1,000 mg by mouth every 6 (six) hours as needed. For pain      . aspirin 81 MG tablet Take 81 mg by mouth daily.        . Calcium-Vitamin D (CALTRATE 600 PLUS-VIT D PO) Take 1 tablet by mouth 2 (two) times daily.      . cetirizine (ZYRTEC) 10 MG tablet Take 10 mg by mouth daily.        . clorazepate (TRANXENE) 7.5 MG tablet Take 7.5 mg by mouth once as needed. For nerves      . Coenzyme Q10 (CO Q 10 PO) Take 1 tablet by mouth every morning.      Marland Kitchen glucosamine-chondroitin 500-400 MG tablet Take 1 tablet by  mouth 2 (two) times daily.       Marland Kitchen lisinopril (PRINIVIL,ZESTRIL) 20 MG tablet Take 20 mg by mouth daily.        . pantoprazole (PROTONIX) 40 MG tablet       . simvastatin (ZOCOR) 40 MG tablet Take 40 mg by mouth at bedtime.         No current facility-administered medications for this visit.    Allergies:    Allergies  Allergen Reactions  . Sulfa Antibiotics Nausea And Vomiting    Social History:  The patient  reports that she has quit smoking. She does not have any smokeless tobacco history on file. She reports that she does not drink alcohol or use illicit drugs.   Family History  Problem Relation Age of Onset  . Heart attack Mother   . Hypertension Mother   . Heart attack Father   . CAD Father   . Heart disease Brother   . Hypertension Brother   . Diabetes Brother   . COPD Brother   . Asthma Brother     ROS:  Please see the  history of present illness.   Denies any syncope, chest pain, orthopnea, PND   All other systems reviewed and negative.   PHYSICAL EXAM: VS:  BP 128/72  Pulse 60  Ht 5\' 4"  (1.626 m)  Wt 134 lb (60.782 kg)  BMI 22.99 kg/m2 Well nourished, well developed, in no acute distress HEENT: normal, [place]/AT, EOMI Neck: no JVD, normal carotid upstroke, no bruit Cardiac:  normal S1, S2; RRR; no murmur Lungs:  clear to auscultation bilaterally, no wheezing, rhonchi or rales Abd: soft, nontender, no hepatomegaly, no bruits Ext: no edema, 2+ distal pulses Skin: warm and dry GU: deferred Neuro: no focal abnormalities noted, AAO x 3  EKG:  11/22/13-sinus rhythm rate 60 with low voltage     ASSESSMENT AND PLAN:  1. Coronary artery disease-drug-eluting stent to RCA. Subsequent nuclear stress test low risk, no ischemia. Doing well, no angina. Taking care of her husband who is also a patient of mine. Continue with secondary prevention. 2. Hyperlipidemia-simvastatin 20-LDL 70 (Dr. Valere Dross results). She is currently in a [employer] study. 3. Essential  hypertension-very well controlled on lisinopril. No changes made. 4. One year follow up.  Signed, Candee Furbish, MD [facility]  11/22/2013 1:55 PM

## 2013-12-06 ENCOUNTER — Encounter: Payer: Self-pay | Admitting: Cardiology

## 2013-12-25 IMAGING — CR DG ABDOMEN ACUTE W/ 1V CHEST
3 series · 3 of 3 positions shown · non-contrast
Comparison: Chest radiograph on 11/07/2010

CLINICAL DATA: Epigastric and upper abdominal pain.

ACUTE ABDOMEN SERIES (ABDOMEN 2 VIEW & CHEST 1 VIEW)

[w chest pa]
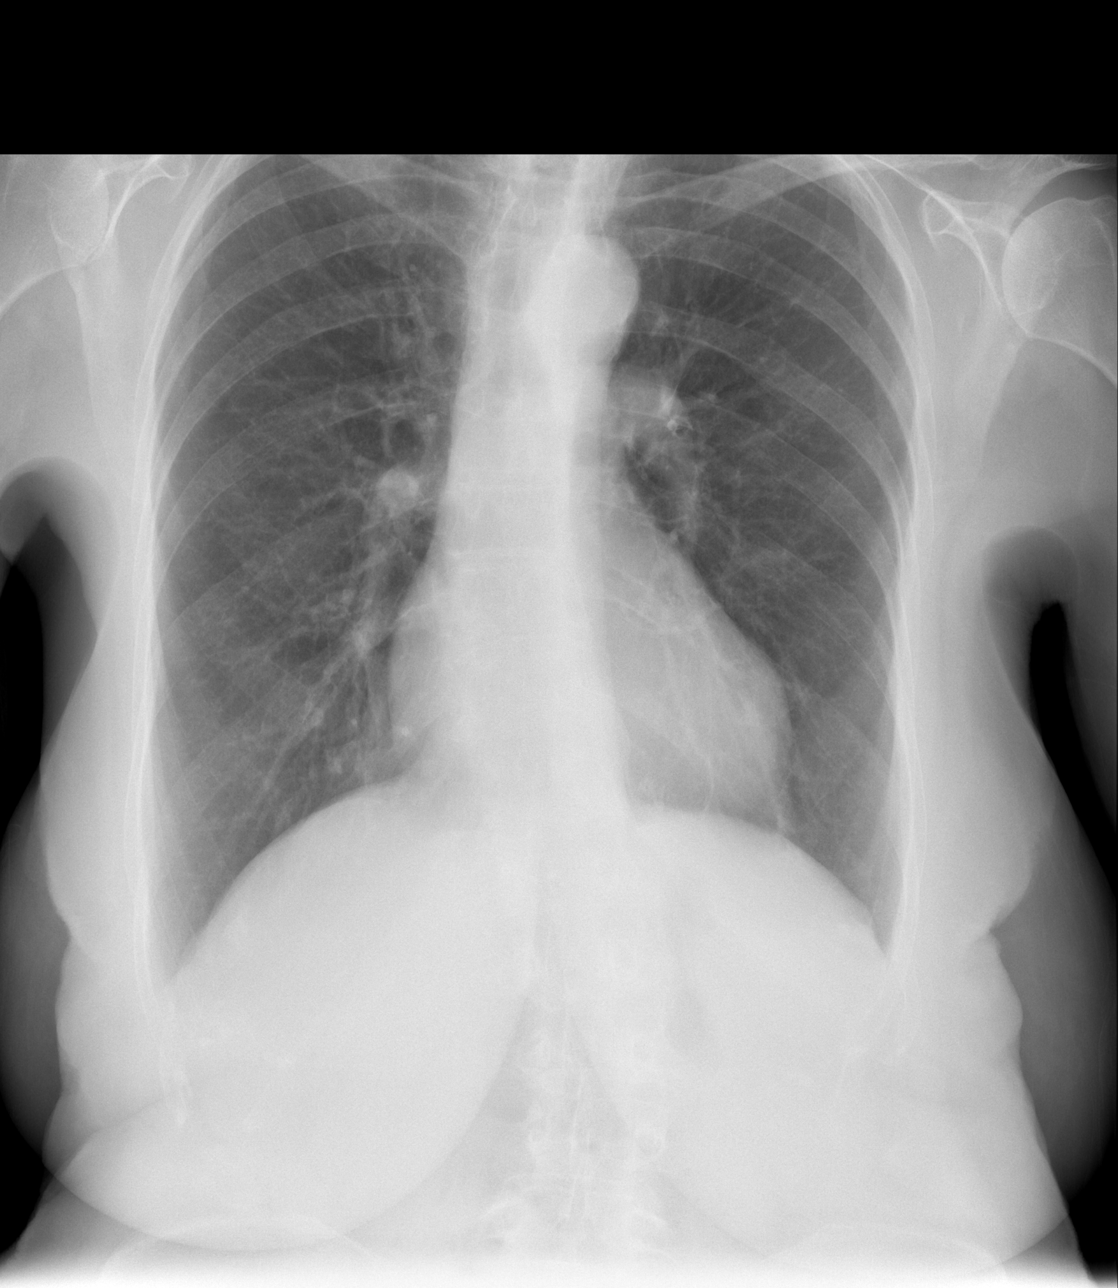

[w abdomen upright]
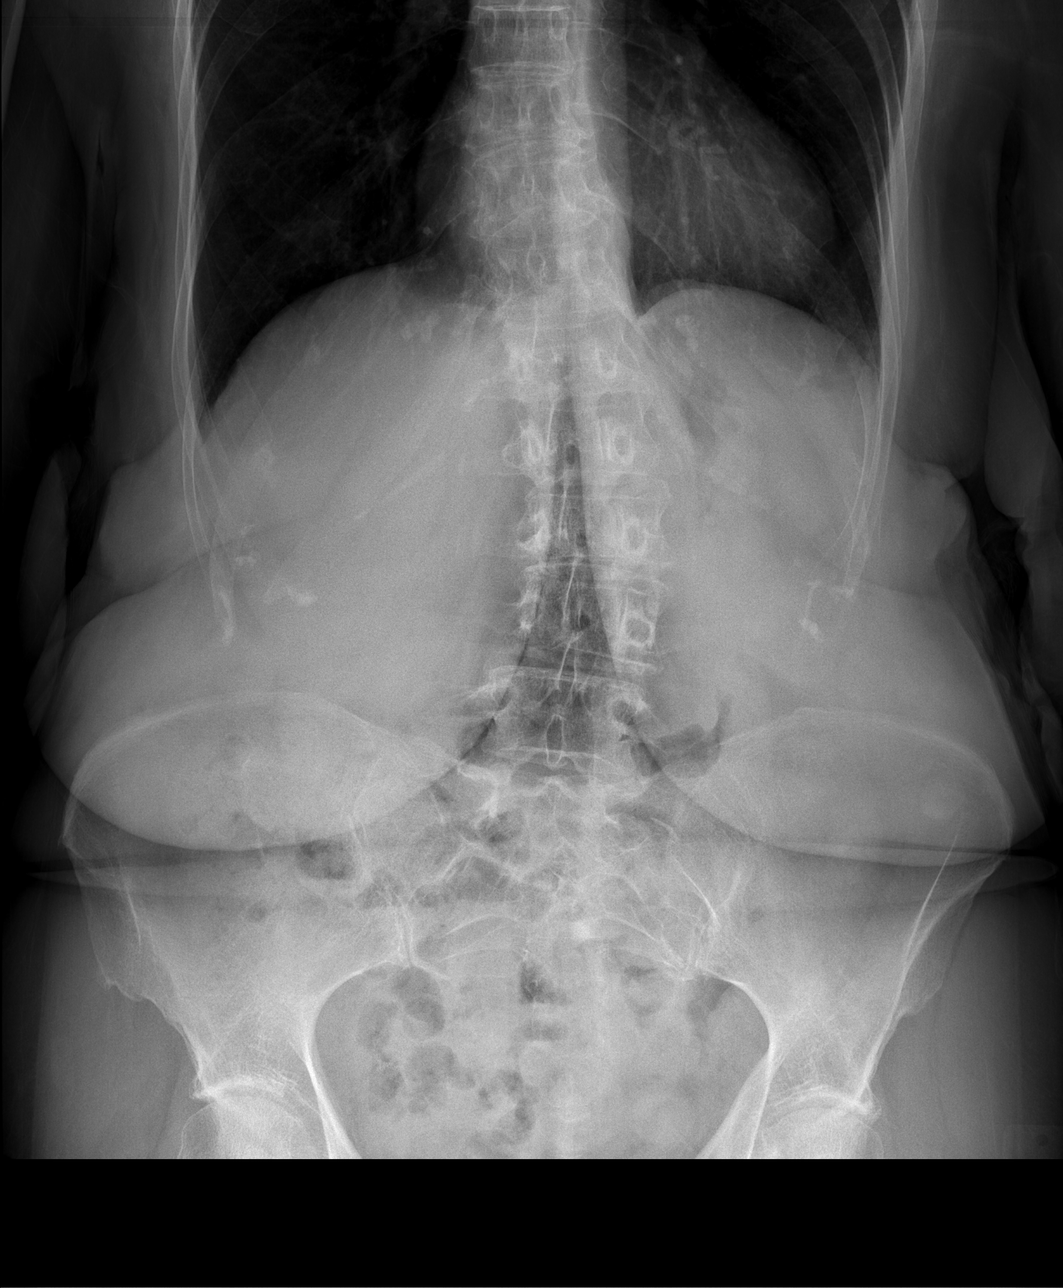

[t abdomen supine]
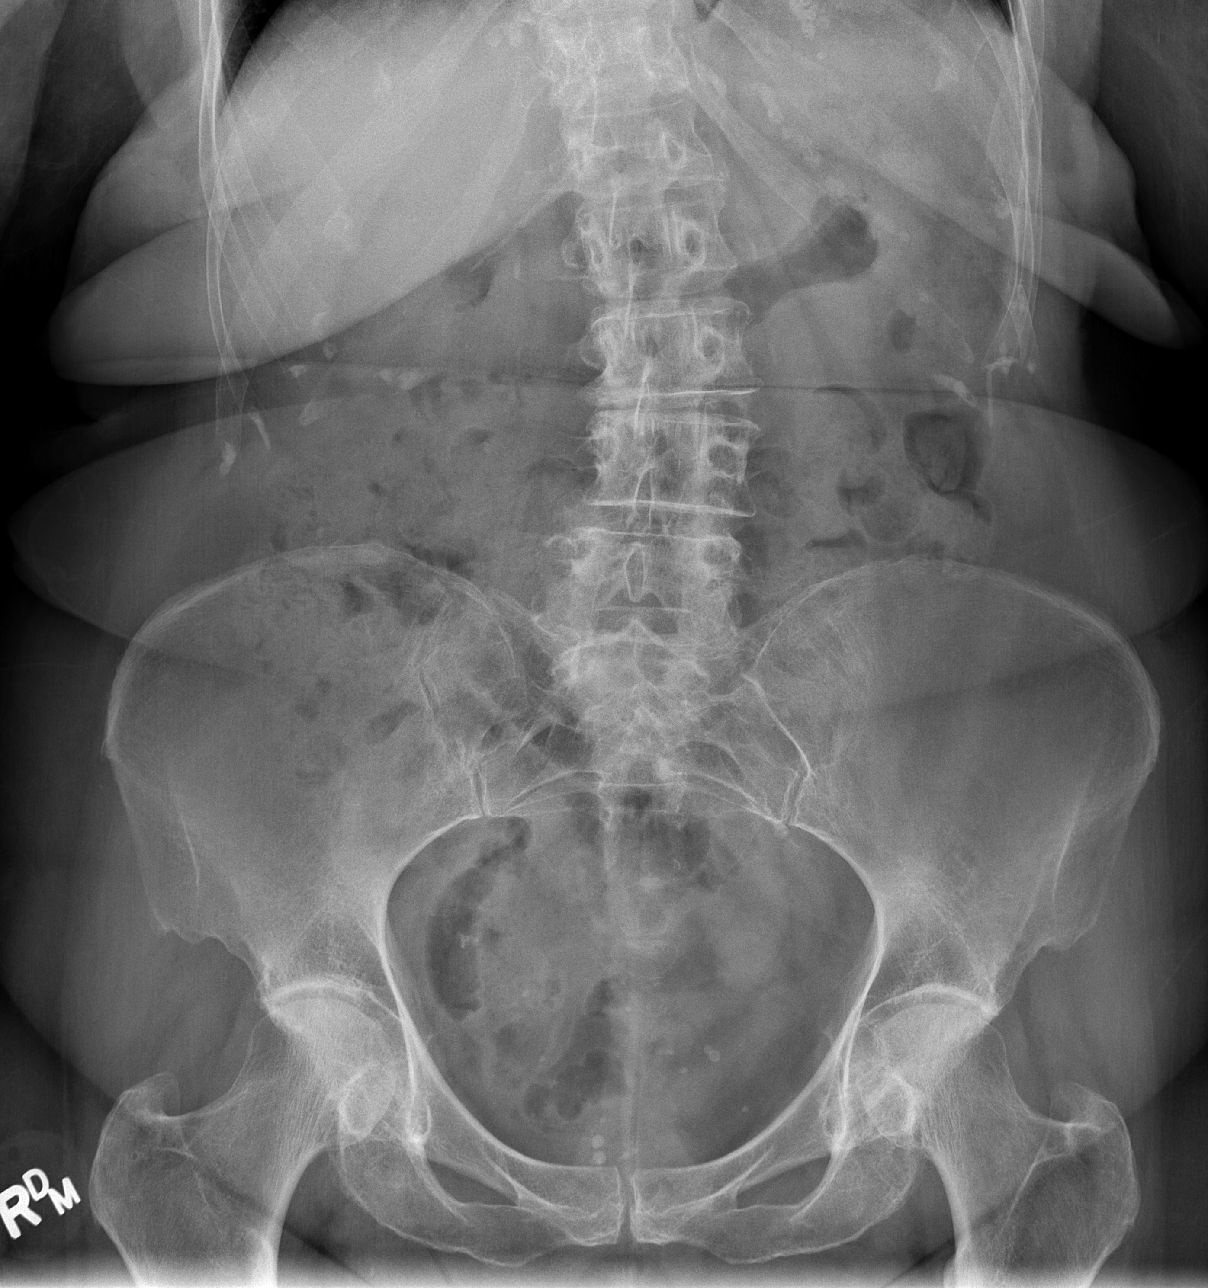

[3 of 3 positions shown; findings below may reference images not displayed]

FINDINGS: No evidence of dilated bowel loops or free air.  Multiple
pelvic phleboliths noted.  No definite radiopaque calculi
identified.  The lumbar spine degenerative changes noted.

Heart size is normal.  Both lungs are clear.  No evidence of
pleural effusion.  Mild pulmonary hyperinflation again noted.  Old
left posterior seventh rib fracture again noted.
IMPRESSION: 1.  Unremarkable bowel gas pattern.  No acute findings.
2.  No active cardiopulmonary disease.

## 2014-01-02 ENCOUNTER — Ambulatory Visit: Payer: Medicare Other | Admitting: Cardiology

## 2014-01-03 DIAGNOSIS — Z23 Encounter for immunization: Secondary | ICD-10-CM | POA: Diagnosis not present

## 2014-01-14 ENCOUNTER — Encounter: Payer: Self-pay | Admitting: Cardiology

## 2014-03-04 ENCOUNTER — Encounter: Payer: Self-pay | Admitting: Gynecology

## 2014-03-04 ENCOUNTER — Ambulatory Visit: Payer: Medicare Other | Admitting: Gynecology

## 2014-03-04 ENCOUNTER — Ambulatory Visit (INDEPENDENT_AMBULATORY_CARE_PROVIDER_SITE_OTHER): Payer: Medicare Other | Admitting: Gynecology

## 2014-03-04 VITALS — BP 130/84

## 2014-03-04 DIAGNOSIS — N6459 Other signs and symptoms in breast: Secondary | ICD-10-CM | POA: Diagnosis not present

## 2014-03-04 DIAGNOSIS — I2510 Atherosclerotic heart disease of native coronary artery without angina pectoris: Secondary | ICD-10-CM | POA: Diagnosis not present

## 2014-03-04 NOTE — Progress Notes (Signed)
   Patient presented to the office for her 6 month follow-up as a result of a right nipple inversion. Her history is as follows:  Patient with past history in 2001 of laparoscopic-assisted vaginal hysterectomy with bilateral salpingo-oophorectomy as a result of endometrial cancer stage I. Patient 2 weeks ago had a normal mammogram with the exception that description of extremely dense breast was noted. Patient with no first-line relative other than to first cousins with history of breast cancer. She denied any nipple discharge or any discoloration or having palpate any masses.   Breasts exam: Both breasts were examined sitting supine position. Both breasts are symmetrical in appearance there was no palpable masses no supraclavicular axillary lymphadenopathy right nipple slightly inverted able to be everted.  Slightly inverted right nipple. Recent normal mammogram. No discoloration and no palpable masses no supraclavicular axillary lymphadenopathy. The patient was reassured. Patient due for mammogram next year patient was encouraged to do her monthly breast exam.

## 2014-07-16 ENCOUNTER — Other Ambulatory Visit (HOSPITAL_BASED_OUTPATIENT_CLINIC_OR_DEPARTMENT_OTHER): Payer: Self-pay | Admitting: Internal Medicine

## 2014-07-16 ENCOUNTER — Other Ambulatory Visit: Payer: Self-pay

## 2014-07-16 DIAGNOSIS — Z1231 Encounter for screening mammogram for malignant neoplasm of breast: Secondary | ICD-10-CM

## 2014-08-15 ENCOUNTER — Other Ambulatory Visit (HOSPITAL_COMMUNITY)
Admission: RE | Admit: 2014-08-15 | Discharge: 2014-08-15 | Disposition: A | Discharge status: Home / Self Care | Payer: Medicare Other | Source: Ambulatory Visit | Attending: Gynecology | Admitting: Gynecology

## 2014-08-15 ENCOUNTER — Ambulatory Visit (INDEPENDENT_AMBULATORY_CARE_PROVIDER_SITE_OTHER): Payer: Medicare Other | Admitting: Women's Health

## 2014-08-15 ENCOUNTER — Encounter: Payer: Self-pay | Admitting: Women's Health

## 2014-08-15 VITALS — BP 118/80 | Ht 64.0 in | Wt 137.0 lb

## 2014-08-15 DIAGNOSIS — C801 Malignant (primary) neoplasm, unspecified: Secondary | ICD-10-CM

## 2014-08-15 DIAGNOSIS — Z01411 Encounter for gynecological examination (general) (routine) with abnormal findings: Secondary | ICD-10-CM | POA: Diagnosis not present

## 2014-08-15 DIAGNOSIS — Z01419 Encounter for gynecological examination (general) (routine) without abnormal findings: Secondary | ICD-10-CM | POA: Diagnosis not present

## 2014-08-15 NOTE — Progress Notes (Signed)
[name] 04/15/1926 [id number]    History:    Presents for breast and pelvic exam.  2001 LAVH and BSO for endometrial cancer stage I with normal Paps after. Normal mammogram history. Hypertension/hypercholesterolemia/CAD cardiologist and primary care manage. 2011 negative colonoscopy. Current on vaccines.  Past medical history, past surgical history, family history and social history were all reviewed and documented in the EPIC chart. Helping to care for husband who is in poor health. Active in the community, able to drive, knits, plays cards.  ROS:  A ROS was performed and pertinent positives and negatives are included.  Exam:  Filed Vitals:   08/15/14 1109  BP: 118/80    General appearance:  Normal Thyroid:  Symmetrical, normal in size, without palpable masses or nodularity. Respiratory  Auscultation:  Clear without wheezing or rhonchi Cardiovascular  Auscultation:  Regular rate, without rubs, murmurs or gallops  Edema/varicosities:  Not grossly evident Abdominal  Soft,nontender, without masses, guarding or rebound.  Liver/spleen:  No organomegaly noted  Hernia:  None appreciated  Skin  Inspection:  Grossly normal   Breasts: Examined lying and sitting.     Right: Without masses, retractions, discharge or axillary adenopathy.     Left: Without masses, retractions, discharge or axillary adenopathy. Gentitourinary   Inguinal/mons:  Normal without inguinal adenopathy  External genitalia:  Normal  BUS/Urethra/Skene's glands:  Normal  Vagina:  Atrophic  Cervix:  And uterus absent  Adnexa/parametria:     Rt: Without masses or tenderness.   Lt: Without masses or tenderness.  Anus and perineum: Normal  Digital rectal exam: Normal sphincter tone without palpated masses or tenderness  Assessment/Plan:  81 y.o. M WF G2 P2 for breast and pelvic exam without complaint.  2001 LAVH with BSO for stage I endometrial cancer normal Paps  after. Hypertension/hypercholesterolemia/CAD-cardiologist and primary care manage labs and meds  Plan: Home safety, fall prevention reviewed. Repeat DEXA. SBE's, continue annual screening mammogram, calcium rich diet, vitamin D 1000 daily encouraged. Pap.. Reviewed will no longer need to repeat Paps.   [name] WHNP, 1:04 PM 08/15/2014

## 2014-08-15 NOTE — Patient Instructions (Signed)

## 2014-08-16 LAB — URINALYSIS W MICROSCOPIC + REFLEX CULTURE
Bacteria, UA: NONE SEEN
Bilirubin Urine: NEGATIVE
CASTS: NONE SEEN
Crystals: NONE SEEN
GLUCOSE, UA: NEGATIVE mg/dL
HGB URINE DIPSTICK: NEGATIVE
KETONES UR: NEGATIVE mg/dL
Leukocytes, UA: NEGATIVE
Nitrite: NEGATIVE
Protein, ur: NEGATIVE mg/dL
Specific Gravity, Urine: 1.006 (ref 1.005–1.030)
Squamous Epithelial / LPF: NONE SEEN
UROBILINOGEN UA: 0.2 mg/dL (ref 0.0–1.0)
pH: 5.5 (ref 5.0–8.0)

## 2014-08-19 LAB — CYTOLOGY - PAP

## 2014-08-22 ENCOUNTER — Ambulatory Visit
Admission: RE | Admit: 2014-08-22 | Discharge: 2014-08-22 | Disposition: A | Discharge status: Home / Self Care | Payer: Medicare Other | Source: Ambulatory Visit

## 2014-08-22 DIAGNOSIS — Z1231 Encounter for screening mammogram for malignant neoplasm of breast: Secondary | ICD-10-CM | POA: Diagnosis not present

## 2014-11-22 ENCOUNTER — Ambulatory Visit: Payer: Medicare Other | Admitting: Cardiology

## 2014-11-29 DIAGNOSIS — E785 Hyperlipidemia, unspecified: Secondary | ICD-10-CM | POA: Diagnosis not present

## 2014-11-29 DIAGNOSIS — M859 Disorder of bone density and structure, unspecified: Secondary | ICD-10-CM | POA: Diagnosis not present

## 2014-11-29 DIAGNOSIS — Z0000 Encounter for general adult medical examination without abnormal findings: Secondary | ICD-10-CM | POA: Diagnosis not present

## 2014-11-29 DIAGNOSIS — I10 Essential (primary) hypertension: Secondary | ICD-10-CM | POA: Diagnosis not present

## 2014-11-29 DIAGNOSIS — M8580 Other specified disorders of bone density and structure, unspecified site: Secondary | ICD-10-CM | POA: Diagnosis not present

## 2014-12-04 DIAGNOSIS — I129 Hypertensive chronic kidney disease with stage 1 through stage 4 chronic kidney disease, or unspecified chronic kidney disease: Secondary | ICD-10-CM | POA: Diagnosis not present

## 2014-12-04 DIAGNOSIS — Z9861 Coronary angioplasty status: Secondary | ICD-10-CM | POA: Diagnosis not present

## 2014-12-04 DIAGNOSIS — M859 Disorder of bone density and structure, unspecified: Secondary | ICD-10-CM | POA: Diagnosis not present

## 2014-12-04 DIAGNOSIS — E785 Hyperlipidemia, unspecified: Secondary | ICD-10-CM | POA: Diagnosis not present

## 2014-12-04 DIAGNOSIS — J309 Allergic rhinitis, unspecified: Secondary | ICD-10-CM | POA: Diagnosis not present

## 2014-12-04 DIAGNOSIS — N182 Chronic kidney disease, stage 2 (mild): Secondary | ICD-10-CM | POA: Diagnosis not present

## 2014-12-04 DIAGNOSIS — I10 Essential (primary) hypertension: Secondary | ICD-10-CM | POA: Diagnosis not present

## 2014-12-04 DIAGNOSIS — Z23 Encounter for immunization: Secondary | ICD-10-CM | POA: Diagnosis not present

## 2014-12-04 DIAGNOSIS — N1831 Chronic kidney disease, stage 3a: Secondary | ICD-10-CM | POA: Insufficient documentation

## 2014-12-04 DIAGNOSIS — K219 Gastro-esophageal reflux disease without esophagitis: Secondary | ICD-10-CM | POA: Diagnosis not present

## 2014-12-04 DIAGNOSIS — Z0000 Encounter for general adult medical examination without abnormal findings: Secondary | ICD-10-CM | POA: Diagnosis not present

## 2014-12-04 DIAGNOSIS — Z1389 Encounter for screening for other disorder: Secondary | ICD-10-CM | POA: Diagnosis not present

## 2014-12-16 ENCOUNTER — Ambulatory Visit (INDEPENDENT_AMBULATORY_CARE_PROVIDER_SITE_OTHER): Payer: Medicare Other | Admitting: Cardiology

## 2014-12-16 ENCOUNTER — Encounter: Payer: Self-pay | Admitting: Cardiology

## 2014-12-16 VITALS — BP 118/70 | HR 64 | Ht 64.0 in | Wt 138.0 lb

## 2014-12-16 DIAGNOSIS — I2510 Atherosclerotic heart disease of native coronary artery without angina pectoris: Secondary | ICD-10-CM | POA: Diagnosis not present

## 2014-12-16 DIAGNOSIS — E7800 Pure hypercholesterolemia, unspecified: Secondary | ICD-10-CM | POA: Diagnosis not present

## 2014-12-16 DIAGNOSIS — I10 Essential (primary) hypertension: Secondary | ICD-10-CM

## 2014-12-16 NOTE — Progress Notes (Signed)
[facility]. [address], [place]  [address] Phone: [phone] Fax:  [phone]  Date:  12/16/2014   ID:  [name], DOB June 09, 1926, MRN [id number]  PCP:  Marton Redwood, MD   History of Present Illness: [name] is a 81 y.o. female with coronary artery disease following DES to proximal RCA in August of 2008 after presenting with typical angina, followup stress test in 2009 showed no ischemia. She has also had cutting balloon to distal RCA/posterior lateral branch.  Stress Cardiolyte, normal with no inducible ischemia, EF 85% Normal max effort GXT, 05/2007   Overall she is doing very well without any symptoms, no chest pain, no shortness of breath, no bleeding, no syncope. Her take her for her husband.  Dr. Brigitte Pulse recently changed her from simvastatin to atorvastatin given her LDL of 80.    Wt Readings from Last 3 Encounters:  12/16/14 138 lb (62.596 kg)  08/15/14 137 lb (62.143 kg)  11/22/13 134 lb (60.782 kg)     Past Medical History  Diagnosis Date  . GERD (gastroesophageal reflux disease)   . CAD (coronary artery disease)   . Plantar fasciitis   . DUB (dysfunctional uterine bleeding)   . Hypertension   . Shingles   . Elevated cholesterol     Past Surgical History  Procedure Laterality Date  . Inguinal hernia repair    . Carpal tunnel release    . Cardiac stint implant  2008  . Vaginal hysterectomy  2001    LAVH, BSO  . Oophorectomy  2001    LAVH,BSO  . Ulna nerve release  2009  . Appendectomy  2011  . Eye surgery      Current Outpatient Prescriptions  Medication Sig Dispense Refill  . Ascorbic Acid (VITAMIN C) 1000 MG tablet Take 1,000 mg by mouth daily.    Marland Kitchen aspirin 81 MG tablet Take 81 mg by mouth daily.      Marland Kitchen atorvastatin (LIPITOR) 40 MG tablet     . Calcium-Vitamin D (CALTRATE 600 PLUS-VIT D PO) Take 1 tablet by mouth 2 (two) times daily.    . cetirizine (ZYRTEC) 10 MG tablet Take 10 mg by mouth daily.      .  clorazepate (TRANXENE) 7.5 MG tablet Take 7.5 mg by mouth once as needed. For nerves    . Coenzyme Q10 (CO Q 10 PO) Take 1 tablet by mouth every morning.    Marland Kitchen glucosamine-chondroitin 500-400 MG tablet Take 1 tablet by mouth 2 (two) times daily.     Marland Kitchen lisinopril (PRINIVIL,ZESTRIL) 40 MG tablet Take 40 mg by mouth daily.    . pantoprazole (PROTONIX) 40 MG tablet     . simvastatin (ZOCOR) 40 MG tablet      No current facility-administered medications for this visit.    Allergies:    Allergies  Allergen Reactions  . Sulfa Antibiotics Nausea And Vomiting    Social History:  The patient  reports that she has quit smoking. She does not have any smokeless tobacco history on file. She reports that she does not drink alcohol or use illicit drugs.   Family History  Problem Relation Age of Onset  . Heart attack Mother   . Hypertension Mother   . Heart attack Father   . CAD Father   . Heart disease Brother   . Hypertension Brother   . Diabetes Brother   . COPD Brother   . Asthma Brother  ROS:  Please see the history of present illness.   Denies any syncope, chest pain, orthopnea, PND   All other systems reviewed and negative.   PHYSICAL EXAM: VS:  BP 118/70 mmHg  Pulse 64  Ht 5\' 4"  (1.626 m)  Wt 138 lb (62.596 kg)  BMI 23.68 kg/m2 Well nourished, well developed, in no acute distress HEENT: normal, [place]/AT, EOMI Neck: no JVD, normal carotid upstroke, no bruit Cardiac:  normal S1, S2; RRR; no murmur Lungs:  clear to auscultation bilaterally, no wheezing, rhonchi or rales Abd: soft, nontender, no hepatomegaly, no bruits Ext: no edema, 2+ distal pulses Skin: warm and dry GU: deferred Neuro: no focal abnormalities noted, AAO x 3  EKG: Today 12/16/14-sinus rhythm, 64, no other abnormalities personally viewed-prior 11/22/13-sinus rhythm rate 60 with low voltage     ASSESSMENT AND PLAN:  1. Coronary artery disease-drug-eluting stent to RCA. Subsequent nuclear stress test low risk, no  ischemia. Doing well, no angina. Taking care of her husband who is also a patient of mine. Continue with secondary prevention. 2. Hyperlipidemia-last LDL was in the 80 range. Dr. Brigitte Pulse changed her to atorvastatin. Previously, simvastatin 20-LDL 70 (Dr. Valere Dross results). She was previously in a [employer] study. 3. Essential hypertension-very well controlled on lisinopril 40. No changes made. Previously increased by Dr. Brigitte Pulse.  4. One year follow up.  Signed, Candee Furbish, MD [facility]  12/16/2014 3:33 PM

## 2014-12-16 NOTE — Patient Instructions (Signed)
Medication Instructions:  Please stop your Simvastatin. Continue all other medications as listed.  Follow-Up: Follow up in 1 year with Dr. Marlou Porch.  You will receive a letter in the mail 2 months before you are due.  Please call us when you receive this letter to schedule your follow up appointment.  Thank you for choosing [facility]!!

## 2015-01-03 DIAGNOSIS — Z6823 Body mass index (BMI) 23.0-23.9, adult: Secondary | ICD-10-CM | POA: Diagnosis not present

## 2015-01-03 DIAGNOSIS — I10 Essential (primary) hypertension: Secondary | ICD-10-CM | POA: Diagnosis not present

## 2015-01-03 DIAGNOSIS — E785 Hyperlipidemia, unspecified: Secondary | ICD-10-CM | POA: Diagnosis not present

## 2015-03-03 DIAGNOSIS — E784 Other hyperlipidemia: Secondary | ICD-10-CM | POA: Diagnosis not present

## 2015-03-04 DIAGNOSIS — R748 Abnormal levels of other serum enzymes: Secondary | ICD-10-CM | POA: Diagnosis not present

## 2015-03-05 DIAGNOSIS — L820 Inflamed seborrheic keratosis: Secondary | ICD-10-CM | POA: Diagnosis not present

## 2015-03-05 DIAGNOSIS — L2089 Other atopic dermatitis: Secondary | ICD-10-CM | POA: Diagnosis not present

## 2015-03-05 DIAGNOSIS — L570 Actinic keratosis: Secondary | ICD-10-CM | POA: Diagnosis not present

## 2015-03-14 DIAGNOSIS — R748 Abnormal levels of other serum enzymes: Secondary | ICD-10-CM | POA: Diagnosis not present

## 2015-04-24 ENCOUNTER — Encounter: Payer: Self-pay | Admitting: Family Medicine

## 2015-04-24 ENCOUNTER — Ambulatory Visit (INDEPENDENT_AMBULATORY_CARE_PROVIDER_SITE_OTHER): Payer: Medicare Other | Admitting: Family Medicine

## 2015-04-24 VITALS — BP 135/79 | HR 67 | Ht 64.0 in | Wt 132.0 lb

## 2015-04-24 DIAGNOSIS — M25562 Pain in left knee: Secondary | ICD-10-CM

## 2015-04-24 NOTE — Patient Instructions (Signed)
Your knee pain is primarily due to a flare of arthritis though you have a little quadriceps tendinitis too. If needed start with tylenol 500mg  1-2 tabs three times a day for pain. Capsaicin, aspercreme, or biofreeze topically up to four times a day may also help with pain. Cortisone injections are an option - call me if it gets bad enough that you want to do this. It's important that you continue to stay active. Straight leg raises, knee extensions 3 sets of 10 once a day (add ankle weight if these become too easy). Consider physical therapy to strengthen muscles around the joint that hurts to take pressure off of the joint itself. Shoe inserts with good arch support may be helpful. Heat 15 minutes at a time 3-4 times a day as needed to help with pain. Ok to continue with walking as tolerated for exercise. Try to avoid deep squats, deep lunges, hills, lots of stairs if possible. Follow up with me as needed.

## 2015-04-28 DIAGNOSIS — M25562 Pain in left knee: Secondary | ICD-10-CM | POA: Insufficient documentation

## 2015-04-28 NOTE — Assessment & Plan Note (Signed)
2/2 DJD and quad tendinitis.  Discussed tylenol, topical medications.  Shown home exercises to do daily.  Intraarticular injection an options if pain is severe and not improving.  Heat as needed.  F/u prn.

## 2015-04-28 NOTE — Progress Notes (Signed)
PCP: Marton Redwood, MD  Subjective:   HPI: Patient is a 82 y.o. female here for left knee pain.  Patient reports for the past 3 days she's had anterior left knee pain without an injury. Pain 0/10 at rest, up to 5/10 at most. Worse with sitting on a low couch and goes to get up. Worse with more flexion. Pain is dull. No skin changes, fever, other complaints.  Past Medical History  Diagnosis Date  . GERD (gastroesophageal reflux disease)   . CAD (coronary artery disease)   . Plantar fasciitis   . DUB (dysfunctional uterine bleeding)   . Hypertension   . Shingles   . Elevated cholesterol     Current Outpatient Prescriptions on File Prior to Visit  Medication Sig Dispense Refill  . Ascorbic Acid (VITAMIN C) 1000 MG tablet Take 1,000 mg by mouth daily.    Marland Kitchen aspirin 81 MG tablet Take 81 mg by mouth daily.      Marland Kitchen atorvastatin (LIPITOR) 40 MG tablet     . Calcium-Vitamin D (CALTRATE 600 PLUS-VIT D PO) Take 1 tablet by mouth 2 (two) times daily.    . cetirizine (ZYRTEC) 10 MG tablet Take 10 mg by mouth daily.      . clorazepate (TRANXENE) 7.5 MG tablet Take 7.5 mg by mouth once as needed. For nerves    . Coenzyme Q10 (CO Q 10 PO) Take 1 tablet by mouth every morning.    Marland Kitchen glucosamine-chondroitin 500-400 MG tablet Take 1 tablet by mouth 2 (two) times daily.     Marland Kitchen lisinopril (PRINIVIL,ZESTRIL) 40 MG tablet Take 40 mg by mouth daily.    . pantoprazole (PROTONIX) 40 MG tablet      No current facility-administered medications on file prior to visit.    Past Surgical History  Procedure Laterality Date  . Inguinal hernia repair    . Carpal tunnel release    . Cardiac stint implant  2008  . Vaginal hysterectomy  2001    LAVH, BSO  . Oophorectomy  2001    LAVH,BSO  . Ulna nerve release  2009  . Appendectomy  2011  . Eye surgery      Allergies  Allergen Reactions  . Sulfa Antibiotics Nausea And Vomiting    Social History   Social History  . Marital Status: Married   Spouse Name: N/A  . Number of Children: N/A  . Years of Education: N/A   Occupational History  . Not on file.   Social History Main Topics  . Smoking status: Former Research scientist (life sciences)  . Smokeless tobacco: Not on file  . Alcohol Use: No  . Drug Use: No  . Sexual Activity: No     Comment: INTERCOURSE AGE 9, SEXUAL PARTNERS LEWSS THAN 7   Other Topics Concern  . Not on file   Social History Narrative    Family History  Problem Relation Age of Onset  . Heart attack Mother   . Hypertension Mother   . Heart attack Father   . CAD Father   . Heart disease Brother   . Hypertension Brother   . Diabetes Brother   . COPD Brother   . Asthma Brother     BP 135/79 mmHg  Pulse 67  Ht 5\' 4"  (1.626 m)  Wt 132 lb (59.875 kg)  BMI 22.65 kg/m2  Review of Systems: See HPI above.    Objective:  Physical Exam:  Gen: NAD  Left knee: No gross deformity, ecchymoses, swelling. Minimal TTP medial  joint line and quadriceps tendon. FROM. Negative ant/post drawers. Negative valgus/varus testing. Negative lachmanns. Negative mcmurrays, apleys, patellar apprehension. NV intact distally.  Right knee: FROM without pain.    Assessment & Plan:  1. Left knee pain - 2/2 DJD and quad tendinitis.  Discussed tylenol, topical medications.  Shown home exercises to do daily.  Intraarticular injection an options if pain is severe and not improving.  Heat as needed.  F/u prn.

## 2015-07-23 ENCOUNTER — Other Ambulatory Visit: Payer: Self-pay

## 2015-07-23 DIAGNOSIS — Z1231 Encounter for screening mammogram for malignant neoplasm of breast: Secondary | ICD-10-CM

## 2015-08-28 ENCOUNTER — Ambulatory Visit
Admission: RE | Admit: 2015-08-28 | Discharge: 2015-08-28 | Disposition: A | Discharge status: Home / Self Care | Payer: Medicare Other | Source: Ambulatory Visit

## 2015-08-28 ENCOUNTER — Other Ambulatory Visit: Payer: Self-pay | Admitting: Internal Medicine

## 2015-08-28 DIAGNOSIS — Z1231 Encounter for screening mammogram for malignant neoplasm of breast: Secondary | ICD-10-CM | POA: Diagnosis not present

## 2015-09-01 DIAGNOSIS — H5213 Myopia, bilateral: Secondary | ICD-10-CM | POA: Diagnosis not present

## 2015-09-01 DIAGNOSIS — Z961 Presence of intraocular lens: Secondary | ICD-10-CM | POA: Diagnosis not present

## 2015-09-01 DIAGNOSIS — H31002 Unspecified chorioretinal scars, left eye: Secondary | ICD-10-CM | POA: Diagnosis not present

## 2015-09-01 DIAGNOSIS — H52203 Unspecified astigmatism, bilateral: Secondary | ICD-10-CM | POA: Diagnosis not present

## 2015-09-03 ENCOUNTER — Encounter: Payer: Self-pay | Admitting: Women's Health

## 2015-09-03 ENCOUNTER — Ambulatory Visit (INDEPENDENT_AMBULATORY_CARE_PROVIDER_SITE_OTHER): Payer: Medicare Other | Admitting: Women's Health

## 2015-09-03 ENCOUNTER — Other Ambulatory Visit: Payer: Self-pay | Admitting: Gynecology

## 2015-09-03 VITALS — BP 110/78 | Ht 64.0 in | Wt 134.0 lb

## 2015-09-03 DIAGNOSIS — Z01419 Encounter for gynecological examination (general) (routine) without abnormal findings: Secondary | ICD-10-CM

## 2015-09-03 DIAGNOSIS — R928 Other abnormal and inconclusive findings on diagnostic imaging of breast: Secondary | ICD-10-CM

## 2015-09-03 DIAGNOSIS — Z8542 Personal history of malignant neoplasm of other parts of uterus: Secondary | ICD-10-CM | POA: Diagnosis not present

## 2015-09-03 NOTE — Progress Notes (Signed)
[name] Aug 02, 1926 [id number]    History:    Presents for breast and pelvic exam. Normal Pap and mammogram history last mammogram does need follow-up will schedule. 2001 LAVH with BSO for postmenopausal bleeding upon biopsy of uterus endometrial cancer stage I was found. 2011 negative colonoscopy. Declines treatment for osteoporosis declines repeat DEXA. Hypertension and hypercholesterolemia managed by primary care. Current on vaccines.  Past medical history, past surgical history, family history and social history were all reviewed and documented in the EPIC chart. Care for her husband. Would like to move to [place], on the waiting list for over a year. Able to drive, knits and plays cards. 2 children 1 lives local 1 lives in [place].  ROS:  A ROS was performed and pertinent positives and negatives are included.  Exam:  Filed Vitals:   09/03/15 1056  BP: 110/78    General appearance:  Normal Thyroid:  Symmetrical, normal in size, without palpable masses or nodularity. Respiratory  Auscultation:  Clear without wheezing or rhonchi Cardiovascular  Auscultation:  Regular rate, without rubs, murmurs or gallops  Edema/varicosities:  Not grossly evident Abdominal  Soft,nontender, without masses, guarding or rebound.  Liver/spleen:  No organomegaly noted  Hernia:  None appreciated  Skin  Inspection:  Grossly normal   Breasts: Examined lying and sitting.     Right: Without masses, retractions, discharge or axillary adenopathy.     Left: Without masses, retractions, discharge or axillary adenopathy. Gentitourinary   Inguinal/mons:  Normal without inguinal adenopathy  External genitalia:  Normal  BUS/Urethra/Skene's glands:  Normal  Vagina:  Normal  Cervix:  And uterus absent Pap from cuff Adnexa/parametria:     Rt: Without masses or tenderness.   Lt: Without masses or tenderness.  Anus and perineum: Normal  Digital rectal exam: Normal sphincter tone without palpated  masses or tenderness  Assessment/Plan:  82 y.o. MWF G2 P2 for breast and pelvic exam.  2001 endometrial cancer stage I noted with LAVH and BSO normal Paps after Hypertension/hypercholesterolemia-primary care manages labs and meds Severe kyphosis-declines DEXA  Plan: Pap from cuff, reviewed if normal this year we can stop Pap smears since greater than 15 years. SBE's, continue annual screening mammogram, 3-D tomography reviewed and encouraged history of dense breasts. Last mammogram 2 weeks ago will need follow-up will get scheduled. Encouraged regular exercise, calcium rich diet, vitamin D 2000 daily. Home safety, fall prevention and importance of weightbearing exercise daily encouraged.      [name], 11:41 AM 09/03/2015

## 2015-09-03 NOTE — Patient Instructions (Signed)
Take vit D 2000iu over the counter daily  Menopause is a normal process in which your reproductive ability comes to an end. This process happens gradually over a span of months to years, usually between the ages of 48 and 49. Menopause is complete when you have missed 12 consecutive menstrual periods. It is important to talk with your health care provider about some of the most common conditions that affect postmenopausal women, such as heart disease, cancer, and bone loss (osteoporosis). Adopting a healthy lifestyle and getting preventive care can help to promote your health and wellness. Those actions can also lower your chances of developing some of these common conditions. WHAT SHOULD I KNOW ABOUT MENOPAUSE? During menopause, you may experience a number of symptoms, such as:  Moderate-to-severe hot flashes.  Night sweats.  Decrease in sex drive.  Mood swings.  Headaches.  Tiredness.  Irritability.  Memory problems.  Insomnia. Choosing to treat or not to treat menopausal changes is an individual decision that you make with your health care provider. WHAT SHOULD I KNOW ABOUT HORMONE REPLACEMENT THERAPY AND SUPPLEMENTS? Hormone therapy products are effective for treating symptoms that are associated with menopause, such as hot flashes and night sweats. Hormone replacement carries certain risks, especially as you become older. If you are thinking about using estrogen or estrogen with progestin treatments, discuss the benefits and risks with your health care provider. WHAT SHOULD I KNOW ABOUT HEART DISEASE AND STROKE? Heart disease, heart attack, and stroke become more likely as you age. This may be due, in part, to the hormonal changes that your body experiences during menopause. These can affect how your body processes dietary fats, triglycerides, and cholesterol. Heart attack and stroke are both medical emergencies. There are many things that you can do to help prevent heart disease  and stroke:  Have your blood pressure checked at least every 1-2 years. High blood pressure causes heart disease and increases the risk of stroke.  If you are 43-1 years old, ask your health care provider if you should take aspirin to prevent a heart attack or a stroke.  Do not use any tobacco products, including cigarettes, chewing tobacco, or electronic cigarettes. If you need help quitting, ask your health care provider.  It is important to eat a healthy diet and maintain a healthy weight.  Be sure to include plenty of vegetables, fruits, low-fat dairy products, and lean protein.  Avoid eating foods that are high in solid fats, added sugars, or salt (sodium).  Get regular exercise. This is one of the most important things that you can do for your health.  Try to exercise for at least 150 minutes each week. The type of exercise that you do should increase your heart rate and make you sweat. This is known as moderate-intensity exercise.  Try to do strengthening exercises at least twice each week. Do these in addition to the moderate-intensity exercise.  Know your numbers.Ask your health care provider to check your cholesterol and your blood glucose. Continue to have your blood tested as directed by your health care provider. WHAT SHOULD I KNOW ABOUT CANCER SCREENING? There are several types of cancer. Take the following steps to reduce your risk and to catch any cancer development as early as possible. Breast Cancer  Practice breast self-awareness.  This means understanding how your breasts normally appear and feel.  It also means doing regular breast self-exams. Let your health care provider know about any changes, no matter how small.  If you  are 36 or older, have a clinician do a breast exam (clinical breast exam or CBE) every year. Depending on your age, family history, and medical history, it may be recommended that you also have a yearly breast X-ray (mammogram).  If you  have a family history of breast cancer, talk with your health care provider about genetic screening.  If you are at high risk for breast cancer, talk with your health care provider about having an MRI and a mammogram every year.  Breast cancer (BRCA) gene test is recommended for women who have family members with BRCA-related cancers. Results of the assessment will determine the need for genetic counseling and BRCA1 and for BRCA2 testing. BRCA-related cancers include these types:  Breast. This occurs in males or females.  Ovarian.  Tubal. This may also be called fallopian tube cancer.  Cancer of the abdominal or pelvic lining (peritoneal cancer).  Prostate.  Pancreatic. Cervical, Uterine, and Ovarian Cancer Your health care provider may recommend that you be screened regularly for cancer of the pelvic organs. These include your ovaries, uterus, and vagina. This screening involves a pelvic exam, which includes checking for microscopic changes to the surface of your cervix (Pap test).  For women ages 21-65, health care providers may recommend a pelvic exam and a Pap test every three years. For women ages 72-65, they may recommend the Pap test and pelvic exam, combined with testing for human papilloma virus (HPV), every five years. Some types of HPV increase your risk of cervical cancer. Testing for HPV may also be done on women of any age who have unclear Pap test results.  Other health care providers may not recommend any screening for nonpregnant women who are considered low risk for pelvic cancer and have no symptoms. Ask your health care provider if a screening pelvic exam is right for you.  If you have had past treatment for cervical cancer or a condition that could lead to cancer, you need Pap tests and screening for cancer for at least 20 years after your treatment. If Pap tests have been discontinued for you, your risk factors (such as having a new sexual partner) need to be reassessed  to determine if you should start having screenings again. Some women have medical problems that increase the chance of getting cervical cancer. In these cases, your health care provider may recommend that you have screening and Pap tests more often.  If you have a family history of uterine cancer or ovarian cancer, talk with your health care provider about genetic screening.  If you have vaginal bleeding after reaching menopause, tell your health care provider.  There are currently no reliable tests available to screen for ovarian cancer. Lung Cancer Lung cancer screening is recommended for adults 22-98 years old who are at high risk for lung cancer because of a history of smoking. A yearly low-dose CT scan of the lungs is recommended if you:  Currently smoke.  Have a history of at least 30 pack-years of smoking and you currently smoke or have quit within the past 15 years. A pack-year is smoking an average of one pack of cigarettes per day for one year. Yearly screening should:  Continue until it has been 15 years since you quit.  Stop if you develop a health problem that would prevent you from having lung cancer treatment. Colorectal Cancer  This type of cancer can be detected and can often be prevented.  Routine colorectal cancer screening usually begins at age 22  and continues through age 78.  If you have risk factors for colon cancer, your health care provider may recommend that you be screened at an earlier age.  If you have a family history of colorectal cancer, talk with your health care provider about genetic screening.  Your health care provider may also recommend using home test kits to check for hidden blood in your stool.  A small camera at the end of a tube can be used to examine your colon directly (sigmoidoscopy or colonoscopy). This is done to check for the earliest forms of colorectal cancer.  Direct examination of the colon should be repeated every 5-10 years until  age 38. However, if early forms of precancerous polyps or small growths are found or if you have a family history or genetic risk for colorectal cancer, you may need to be screened more often. Skin Cancer  Check your skin from head to toe regularly.  Monitor any moles. Be sure to tell your health care provider:  About any new moles or changes in moles, especially if there is a change in a mole's shape or color.  If you have a mole that is larger than the size of a pencil eraser.  If any of your family members has a history of skin cancer, especially at a young age, talk with your health care provider about genetic screening.  Always use sunscreen. Apply sunscreen liberally and repeatedly throughout the day.  Whenever you are outside, protect yourself by wearing long sleeves, pants, a wide-brimmed hat, and sunglasses. WHAT SHOULD I KNOW ABOUT OSTEOPOROSIS? Osteoporosis is a condition in which bone destruction happens more quickly than new bone creation. After menopause, you may be at an increased risk for osteoporosis. To help prevent osteoporosis or the bone fractures that can happen because of osteoporosis, the following is recommended:  If you are 31-69 years old, get at least 1,000 mg of calcium and at least 600 mg of vitamin D per day.  If you are older than age 62 but younger than age 21, get at least 1,200 mg of calcium and at least 600 mg of vitamin D per day.  If you are older than age 75, get at least 1,200 mg of calcium and at least 800 mg of vitamin D per day. Smoking and excessive alcohol intake increase the risk of osteoporosis. Eat foods that are rich in calcium and vitamin D, and do weight-bearing exercises several times each week as directed by your health care provider. WHAT SHOULD I KNOW ABOUT HOW MENOPAUSE AFFECTS [place]? Depression may occur at any age, but it is more common as you become older. Common symptoms of depression include:  Low or sad  mood.  Changes in sleep patterns.  Changes in appetite or eating patterns.  Feeling an overall lack of motivation or enjoyment of activities that you previously enjoyed.  Frequent crying spells. Talk with your health care provider if you think that you are experiencing depression. WHAT SHOULD I KNOW ABOUT IMMUNIZATIONS? It is important that you get and maintain your immunizations. These include:  Tetanus, diphtheria, and pertussis (Tdap) booster vaccine.  Influenza every year before the flu season begins.  Pneumonia vaccine.  Shingles vaccine. Your health care provider may also recommend other immunizations.   This information is not intended to replace advice given to you by your health care provider. Make sure you discuss any questions you have with your health care provider.   Document Released: 04/23/2005 Document Revised: 03/22/2014  Document Reviewed: 11/01/2013 Elsevier Interactive Patient Education [employer].

## 2015-09-04 LAB — PAP IG W/ RFLX HPV ASCU

## 2015-09-09 ENCOUNTER — Ambulatory Visit
Admission: RE | Admit: 2015-09-09 | Discharge: 2015-09-09 | Disposition: A | Discharge status: Home / Self Care | Payer: Medicare Other | Source: Ambulatory Visit | Attending: Gynecology | Admitting: Gynecology

## 2015-09-09 DIAGNOSIS — R928 Other abnormal and inconclusive findings on diagnostic imaging of breast: Secondary | ICD-10-CM

## 2015-09-09 DIAGNOSIS — R922 Inconclusive mammogram: Secondary | ICD-10-CM | POA: Diagnosis not present

## 2015-10-20 DIAGNOSIS — H1033 Unspecified acute conjunctivitis, bilateral: Secondary | ICD-10-CM | POA: Diagnosis not present

## 2016-01-07 DIAGNOSIS — Z23 Encounter for immunization: Secondary | ICD-10-CM | POA: Diagnosis not present

## 2016-01-12 ENCOUNTER — Ambulatory Visit (INDEPENDENT_AMBULATORY_CARE_PROVIDER_SITE_OTHER): Payer: Medicare Other | Admitting: Cardiology

## 2016-01-12 ENCOUNTER — Encounter: Payer: Self-pay | Admitting: Cardiology

## 2016-01-12 VITALS — BP 128/84 | HR 63 | Ht 63.5 in | Wt 138.4 lb

## 2016-01-12 DIAGNOSIS — I2510 Atherosclerotic heart disease of native coronary artery without angina pectoris: Secondary | ICD-10-CM

## 2016-01-12 DIAGNOSIS — I10 Essential (primary) hypertension: Secondary | ICD-10-CM

## 2016-01-12 NOTE — Progress Notes (Signed)
[facility]. [address] [place], [place]  [address] Phone: [phone] Fax:  [phone]  Date:  01/12/2016   ID:  [name], DOB July 04, 1926, MRN [id number]  PCP:  Marton Redwood, MD   History of Present Illness: [name] is a 82 y.o. female with coronary artery disease following DES to proximal RCA in August of 2008 after presenting with typical angina, followup stress test in 2009 showed no ischemia. She has also had cutting balloon to distal RCA/posterior lateral branch.  Stress Cardiolyte, normal with no inducible ischemia, EF 85% Normal max effort GXT, 05/2007   Overall she is doing very well without any symptoms, no chest pain, no shortness of breath, no bleeding, no syncope. She takes her of her husband who is a patient of mine.  Dr. Brigitte Pulse changed her from simvastatin to atorvastatin given her LDL of 80.    Wt Readings from Last 3 Encounters:  01/12/16 138 lb 6.4 oz (62.8 kg)  09/03/15 134 lb (60.8 kg)  04/24/15 132 lb (59.9 kg)     Past Medical History:  Diagnosis Date  . CAD (coronary artery disease)   . DUB (dysfunctional uterine bleeding)   . Elevated cholesterol   . GERD (gastroesophageal reflux disease)   . Hypertension   . Plantar fasciitis   . Shingles     Past Surgical History:  Procedure Laterality Date  . APPENDECTOMY  2011  . CARDIAC STINT IMPLANT  2008  . CARPAL TUNNEL RELEASE    . EYE SURGERY    . INGUINAL HERNIA REPAIR    . OOPHORECTOMY  2001   LAVH,BSO  . ULNA NERVE RELEASE  2009  . VAGINAL HYSTERECTOMY  2001   LAVH, BSO    Current Outpatient Prescriptions  Medication Sig Dispense Refill  . Ascorbic Acid (VITAMIN C) 1000 MG tablet Take 1,000 mg by mouth daily.    Marland Kitchen aspirin 81 MG tablet Take 81 mg by mouth daily.      Marland Kitchen atorvastatin (LIPITOR) 40 MG tablet     . Calcium-Vitamin D (CALTRATE 600 PLUS-VIT D PO) Take 1 tablet by mouth 2 (two) times daily.    . cetirizine (ZYRTEC) 10 MG tablet Take 10 mg by mouth daily.       . Cholecalciferol (VITAMIN D) 2000 units CAPS Take 2,000 Units by mouth daily.    . clorazepate (TRANXENE) 7.5 MG tablet Take 7.5 mg by mouth once as needed. For nerves    . Coenzyme Q10 (CO Q 10 PO) Take 1 tablet by mouth every morning.    Marland Kitchen glucosamine-chondroitin 500-400 MG tablet Take 1 tablet by mouth 2 (two) times daily.     Marland Kitchen lisinopril (PRINIVIL,ZESTRIL) 40 MG tablet Take 40 mg by mouth daily.    . pantoprazole (PROTONIX) 40 MG tablet     . Probiotic Product (ALIGN) 4 MG CAPS Take 4 mg by mouth daily.     No current facility-administered medications for this visit.     Allergies:    Allergies  Allergen Reactions  . Sulfa Antibiotics Nausea And Vomiting    Social History:  The patient  reports that she has quit smoking. She does not have any smokeless tobacco history on file. She reports that she does not drink alcohol or use drugs.   Family History  Problem Relation Age of Onset  . Heart attack Mother   . Hypertension Mother   . Heart attack Father   . CAD Father   .  Heart disease Brother   . Hypertension Brother   . Diabetes Brother   . COPD Brother   . Asthma Brother     ROS:  Please see the history of present illness.   Denies any syncope, chest pain, orthopnea, PND   All other systems reviewed and negative.   PHYSICAL EXAM: VS:  BP 128/84   Pulse 63   Ht 5' 3.5" (1.613 m)   Wt 138 lb 6.4 oz (62.8 kg)   LMP  (LMP Unknown)   BMI 24.13 kg/m  Well nourished, well developed, in no acute distress HEENT: normal, [place]/AT, EOMI Neck: no JVD, normal carotid upstroke, no bruit Cardiac:  normal S1, S2; RRR; no murmur Lungs:  clear to auscultation bilaterally, no wheezing, rhonchi or rales Abd: soft, nontender, no hepatomegaly, no bruits Ext: no edema, 2+ distal pulses Skin: warm and dryVaricose veins GU: deferred Neuro: no focal abnormalities noted, AAO x 3  EKG: Today 01/12/16-sinus rhythm, 63, rightward axis personally viewed-prior 12/16/14-sinus rhythm, 64,  no other abnormalities personally viewed-prior 11/22/13-sinus rhythm rate 60 with low voltage     ASSESSMENT AND PLAN:  1. Coronary artery disease-drug-eluting stent to RCA. Subsequent nuclear stress test low risk, no ischemia. Doing well, no angina. Taking care of her husband who is also a patient of mine. Continue with secondary prevention. 2. Hyperlipidemia-last LDL was in the 80 range. Dr. Brigitte Pulse changed her to atorvastatin. Previously, simvastatin 20-LDL 70 (Dr. Valere Dross results). 3. Essential hypertension-very well controlled on lisinopril 40. No changes made. Previously increased by Dr. Brigitte Pulse.  4. One year follow up.  Signed, Candee Furbish, MD [facility]  01/12/2016 2:29 PM

## 2016-01-12 NOTE — Patient Instructions (Signed)
Medication Instructions:  Your physician recommends that you continue on your current medications as directed. Please refer to the Current Medication list given to you today.   Labwork: none  Testing/Procedures: none  Follow-Up: Your physician wants you to follow-up in: 1 year with Dr Marlou Porch. (October 2018).  You will receive a reminder letter in the mail two months in advance. If you don't receive a letter, please call our office to schedule the follow-up appointment.        If you need a refill on your cardiac medications before your next appointment, please call your pharmacy.

## 2016-03-04 DIAGNOSIS — I10 Essential (primary) hypertension: Secondary | ICD-10-CM | POA: Diagnosis not present

## 2016-03-04 DIAGNOSIS — M859 Disorder of bone density and structure, unspecified: Secondary | ICD-10-CM | POA: Diagnosis not present

## 2016-03-04 DIAGNOSIS — E784 Other hyperlipidemia: Secondary | ICD-10-CM | POA: Diagnosis not present

## 2016-03-11 DIAGNOSIS — E784 Other hyperlipidemia: Secondary | ICD-10-CM | POA: Diagnosis not present

## 2016-03-11 DIAGNOSIS — Z1389 Encounter for screening for other disorder: Secondary | ICD-10-CM | POA: Diagnosis not present

## 2016-03-11 DIAGNOSIS — I129 Hypertensive chronic kidney disease with stage 1 through stage 4 chronic kidney disease, or unspecified chronic kidney disease: Secondary | ICD-10-CM | POA: Diagnosis not present

## 2016-03-11 DIAGNOSIS — Z0000 Encounter for general adult medical examination without abnormal findings: Secondary | ICD-10-CM | POA: Diagnosis not present

## 2016-03-11 DIAGNOSIS — K5909 Other constipation: Secondary | ICD-10-CM | POA: Diagnosis not present

## 2016-03-11 DIAGNOSIS — I10 Essential (primary) hypertension: Secondary | ICD-10-CM | POA: Diagnosis not present

## 2016-03-11 DIAGNOSIS — Z9861 Coronary angioplasty status: Secondary | ICD-10-CM | POA: Diagnosis not present

## 2016-03-11 DIAGNOSIS — N182 Chronic kidney disease, stage 2 (mild): Secondary | ICD-10-CM | POA: Diagnosis not present

## 2016-03-11 DIAGNOSIS — J3089 Other allergic rhinitis: Secondary | ICD-10-CM | POA: Diagnosis not present

## 2016-03-11 DIAGNOSIS — K219 Gastro-esophageal reflux disease without esophagitis: Secondary | ICD-10-CM | POA: Diagnosis not present

## 2016-03-11 DIAGNOSIS — Z6824 Body mass index (BMI) 24.0-24.9, adult: Secondary | ICD-10-CM | POA: Diagnosis not present

## 2016-03-11 DIAGNOSIS — M859 Disorder of bone density and structure, unspecified: Secondary | ICD-10-CM | POA: Diagnosis not present

## 2016-07-08 DIAGNOSIS — M170 Bilateral primary osteoarthritis of knee: Secondary | ICD-10-CM | POA: Diagnosis not present

## 2016-07-08 DIAGNOSIS — M25551 Pain in right hip: Secondary | ICD-10-CM | POA: Diagnosis not present

## 2016-07-28 ENCOUNTER — Encounter: Payer: Self-pay | Admitting: Gynecology

## 2016-09-08 ENCOUNTER — Encounter: Payer: Self-pay | Admitting: Women's Health

## 2016-09-08 ENCOUNTER — Ambulatory Visit (INDEPENDENT_AMBULATORY_CARE_PROVIDER_SITE_OTHER): Payer: Medicare Other | Admitting: Women's Health

## 2016-09-08 VITALS — BP 122/78 | Ht 64.0 in | Wt 141.2 lb

## 2016-09-08 DIAGNOSIS — Z8542 Personal history of malignant neoplasm of other parts of uterus: Secondary | ICD-10-CM | POA: Diagnosis not present

## 2016-09-08 DIAGNOSIS — Z1272 Encounter for screening for malignant neoplasm of vagina: Secondary | ICD-10-CM | POA: Diagnosis not present

## 2016-09-08 NOTE — Progress Notes (Signed)
[name] 17-Jul-1977 [id number]    History:    Presents for breast and pelvic exam and Pap. 2011 LAVH with BSO for endometrial cancer. Vaccines current. Normal Pap and mammogram history. Hypertension, coronary artery disease, hypercholesterolemia managed by primary care.   Past medical history, past surgical history, family history and social history were all reviewed and documented in the EPIC chart. Lives with her husband. Continues to be able to drive, knits, plays cards. Had been hoping to move to [place], husband is a smoker and they do not accept smokers. Has 2 daughters, both doing well.  ROS:  A ROS was performed and pertinent positives and negatives are included.  Exam:  Vitals:   09/08/16 1156  BP: 122/78  Weight: 141 lb 3.2 oz (64 kg)  Height: 5\' 4"  (1.626 m)   Body mass index is 24.24 kg/m.   General appearance:  Normal Thyroid:  Symmetrical, normal in size, without palpable masses or nodularity. Respiratory  Auscultation:  Clear without wheezing or rhonchi Cardiovascular  Auscultation:  Regular rate, without rubs, murmurs or gallops  Edema/varicosities:  Not grossly evident Abdominal  Soft,nontender, without masses, guarding or rebound.  Liver/spleen:  No organomegaly noted  Hernia:  None appreciated  Skin  Inspection:  Grossly normal   Breasts: Examined lying and sitting.     Right: Without masses, retractions, discharge or axillary adenopathy.     Left: Without masses, retractions, discharge or axillary adenopathy. Gentitourinary   Inguinal/mons:  Normal without inguinal adenopathy  External genitalia:  Normal  BUS/Urethra/Skene's glands:  Normal  Vagina: Atrophic  Cervix:  Absent  Uterus: Absent  Adnexa/parametria:     Rt: Without masses or tenderness.   Lt: Without masses or tenderness.  Anus and perineum: Normal  Digital rectal exam: Normal sphincter tone without palpated masses or tenderness  Assessment/Plan:  83 y.o. MWF G2 P2 for  breast and pelvic exam.  2011 LAVH with BSO for uterine cancer Hypertension/CAD/hypercholesterolemia-primary care manages labs and meds  Plan: SBE's, continue annual screening mammogram, calcium rich diet, vitamin D 2000 daily encouraged. Home safety, fall prevention and importance of weightbearing exercise reviewed. Pap. Screening guidelines reviewed due to history of uterine cancer.    [place], 1:00 PM 09/08/2016

## 2016-09-08 NOTE — Patient Instructions (Signed)
Health Maintenance for Postmenopausal Women Menopause is a normal process in which your reproductive ability comes to an end. This process happens gradually over a span of months to years, usually between the ages of 70 and 25. Menopause is complete when you have missed 12 consecutive menstrual periods. It is important to talk with your health care provider about some of the most common conditions that affect postmenopausal women, such as heart disease, cancer, and bone loss (osteoporosis). Adopting a healthy lifestyle and getting preventive care can help to promote your health and wellness. Those actions can also lower your chances of developing some of these common conditions. What should I know about menopause? During menopause, you may experience a number of symptoms, such as:  Moderate-to-severe hot flashes.  Night sweats.  Decrease in sex drive.  Mood swings.  Headaches.  Tiredness.  Irritability.  Memory problems.  Insomnia.  Choosing to treat or not to treat menopausal changes is an individual decision that you make with your health care provider. What should I know about hormone replacement therapy and supplements? Hormone therapy products are effective for treating symptoms that are associated with menopause, such as hot flashes and night sweats. Hormone replacement carries certain risks, especially as you become older. If you are thinking about using estrogen or estrogen with progestin treatments, discuss the benefits and risks with your health care provider. What should I know about heart disease and stroke? Heart disease, heart attack, and stroke become more likely as you age. This may be due, in part, to the hormonal changes that your body experiences during menopause. These can affect how your body processes dietary fats, triglycerides, and cholesterol. Heart attack and stroke are both medical emergencies. There are many things that you can do to help prevent heart disease  and stroke:  Have your blood pressure checked at least every 1-2 years. High blood pressure causes heart disease and increases the risk of stroke.  If you are 76-58 years old, ask your health care provider if you should take aspirin to prevent a heart attack or a stroke.  Do not use any tobacco products, including cigarettes, chewing tobacco, or electronic cigarettes. If you need help quitting, ask your health care provider.  It is important to eat a healthy diet and maintain a healthy weight. ? Be sure to include plenty of vegetables, fruits, low-fat dairy products, and lean protein. ? Avoid eating foods that are high in solid fats, added sugars, or salt (sodium).  Get regular exercise. This is one of the most important things that you can do for your health. ? Try to exercise for at least 150 minutes each week. The type of exercise that you do should increase your heart rate and make you sweat. This is known as moderate-intensity exercise. ? Try to do strengthening exercises at least twice each week. Do these in addition to the moderate-intensity exercise.  Know your numbers.Ask your health care provider to check your cholesterol and your blood glucose. Continue to have your blood tested as directed by your health care provider.  What should I know about cancer screening? There are several types of cancer. Take the following steps to reduce your risk and to catch any cancer development as early as possible. Breast Cancer  Practice breast self-awareness. ? This means understanding how your breasts normally appear and feel. ? It also means doing regular breast self-exams. Let your health care provider know about any changes, no matter how small.  If you are 40  or older, have a clinician do a breast exam (clinical breast exam or CBE) every year. Depending on your age, family history, and medical history, it may be recommended that you also have a yearly breast X-ray (mammogram).  If you  have a family history of breast cancer, talk with your health care provider about genetic screening.  If you are at high risk for breast cancer, talk with your health care provider about having an MRI and a mammogram every year.  Breast cancer (BRCA) gene test is recommended for women who have family members with BRCA-related cancers. Results of the assessment will determine the need for genetic counseling and BRCA1 and for BRCA2 testing. BRCA-related cancers include these types: ? Breast. This occurs in males or females. ? Ovarian. ? Tubal. This may also be called fallopian tube cancer. ? Cancer of the abdominal or pelvic lining (peritoneal cancer). ? Prostate. ? Pancreatic.  Cervical, Uterine, and Ovarian Cancer Your health care provider may recommend that you be screened regularly for cancer of the pelvic organs. These include your ovaries, uterus, and vagina. This screening involves a pelvic exam, which includes checking for microscopic changes to the surface of your cervix (Pap test).  For women ages 21-65, health care providers may recommend a pelvic exam and a Pap test every three years. For women ages 40-65, they may recommend the Pap test and pelvic exam, combined with testing for human papilloma virus (HPV), every five years. Some types of HPV increase your risk of cervical cancer. Testing for HPV may also be done on women of any age who have unclear Pap test results.  Other health care providers may not recommend any screening for nonpregnant women who are considered low risk for pelvic cancer and have no symptoms. Ask your health care provider if a screening pelvic exam is right for you.  If you have had past treatment for cervical cancer or a condition that could lead to cancer, you need Pap tests and screening for cancer for at least 20 years after your treatment. If Pap tests have been discontinued for you, your risk factors (such as having a new sexual partner) need to be  reassessed to determine if you should start having screenings again. Some women have medical problems that increase the chance of getting cervical cancer. In these cases, your health care provider may recommend that you have screening and Pap tests more often.  If you have a family history of uterine cancer or ovarian cancer, talk with your health care provider about genetic screening.  If you have vaginal bleeding after reaching menopause, tell your health care provider.  There are currently no reliable tests available to screen for ovarian cancer.  Lung Cancer Lung cancer screening is recommended for adults 12-34 years old who are at high risk for lung cancer because of a history of smoking. A yearly low-dose CT scan of the lungs is recommended if you:  Currently smoke.  Have a history of at least 30 pack-years of smoking and you currently smoke or have quit within the past 15 years. A pack-year is smoking an average of one pack of cigarettes per day for one year.  Yearly screening should:  Continue until it has been 15 years since you quit.  Stop if you develop a health problem that would prevent you from having lung cancer treatment.  Colorectal Cancer  This type of cancer can be detected and can often be prevented.  Routine colorectal cancer screening usually begins at  age 42 and continues through age 45.  If you have risk factors for colon cancer, your health care provider may recommend that you be screened at an earlier age.  If you have a family history of colorectal cancer, talk with your health care provider about genetic screening.  Your health care provider may also recommend using home test kits to check for hidden blood in your stool.  A small camera at the end of a tube can be used to examine your colon directly (sigmoidoscopy or colonoscopy). This is done to check for the earliest forms of colorectal cancer.  Direct examination of the colon should be repeated every  5-10 years until age 71. However, if early forms of precancerous polyps or small growths are found or if you have a family history or genetic risk for colorectal cancer, you may need to be screened more often.  Skin Cancer  Check your skin from head to toe regularly.  Monitor any moles. Be sure to tell your health care provider: ? About any new moles or changes in moles, especially if there is a change in a mole's shape or color. ? If you have a mole that is larger than the size of a pencil eraser.  If any of your family members has a history of skin cancer, especially at a young age, talk with your health care provider about genetic screening.  Always use sunscreen. Apply sunscreen liberally and repeatedly throughout the day.  Whenever you are outside, protect yourself by wearing long sleeves, pants, a wide-brimmed hat, and sunglasses.  What should I know about osteoporosis? Osteoporosis is a condition in which bone destruction happens more quickly than new bone creation. After menopause, you may be at an increased risk for osteoporosis. To help prevent osteoporosis or the bone fractures that can happen because of osteoporosis, the following is recommended:  If you are 46-71 years old, get at least 1,000 mg of calcium and at least 600 mg of vitamin D per day.  If you are older than age 55 but younger than age 65, get at least 1,200 mg of calcium and at least 600 mg of vitamin D per day.  If you are older than age 54, get at least 1,200 mg of calcium and at least 800 mg of vitamin D per day.  Smoking and excessive alcohol intake increase the risk of osteoporosis. Eat foods that are rich in calcium and vitamin D, and do weight-bearing exercises several times each week as directed by your health care provider. What should I know about how menopause affects my mental health? Depression may occur at any age, but it is more common as you become older. Common symptoms of depression  include:  Low or sad mood.  Changes in sleep patterns.  Changes in appetite or eating patterns.  Feeling an overall lack of motivation or enjoyment of activities that you previously enjoyed.  Frequent crying spells.  Talk with your health care provider if you think that you are experiencing depression. What should I know about immunizations? It is important that you get and maintain your immunizations. These include:  Tetanus, diphtheria, and pertussis (Tdap) booster vaccine.  Influenza every year before the flu season begins.  Pneumonia vaccine.  Shingles vaccine.  Your health care provider may also recommend other immunizations. This information is not intended to replace advice given to you by your health care provider. Make sure you discuss any questions you have with your health care provider. Document Released: 04/23/2005  Document Revised: 09/19/2015 Document Reviewed: 12/03/2014 Elsevier Interactive Patient Education  2018 [employer]  

## 2016-09-10 LAB — PAP IG (IMAGE GUIDED)

## 2016-09-21 DIAGNOSIS — M47816 Spondylosis without myelopathy or radiculopathy, lumbar region: Secondary | ICD-10-CM | POA: Diagnosis not present

## 2016-09-27 ENCOUNTER — Other Ambulatory Visit: Payer: Self-pay | Admitting: Internal Medicine

## 2016-09-27 DIAGNOSIS — Z1231 Encounter for screening mammogram for malignant neoplasm of breast: Secondary | ICD-10-CM

## 2016-10-19 DIAGNOSIS — H5213 Myopia, bilateral: Secondary | ICD-10-CM | POA: Diagnosis not present

## 2016-10-19 DIAGNOSIS — H01001 Unspecified blepharitis right upper eyelid: Secondary | ICD-10-CM | POA: Diagnosis not present

## 2016-10-19 DIAGNOSIS — Z961 Presence of intraocular lens: Secondary | ICD-10-CM | POA: Diagnosis not present

## 2016-10-19 DIAGNOSIS — H31002 Unspecified chorioretinal scars, left eye: Secondary | ICD-10-CM | POA: Diagnosis not present

## 2016-10-21 ENCOUNTER — Ambulatory Visit
Admission: RE | Admit: 2016-10-21 | Discharge: 2016-10-21 | Disposition: A | Discharge status: Home / Self Care | Payer: Medicare Other | Source: Ambulatory Visit | Attending: Internal Medicine | Admitting: Internal Medicine

## 2016-10-21 DIAGNOSIS — Z1231 Encounter for screening mammogram for malignant neoplasm of breast: Secondary | ICD-10-CM | POA: Diagnosis not present

## 2016-11-11 ENCOUNTER — Ambulatory Visit: Payer: Medicare Other | Admitting: Podiatry

## 2016-11-12 ENCOUNTER — Ambulatory Visit: Payer: Medicare Other | Admitting: Podiatry

## 2016-12-01 DIAGNOSIS — M47816 Spondylosis without myelopathy or radiculopathy, lumbar region: Secondary | ICD-10-CM | POA: Diagnosis not present

## 2016-12-07 DIAGNOSIS — M545 Low back pain: Secondary | ICD-10-CM | POA: Diagnosis not present

## 2016-12-13 DIAGNOSIS — M545 Low back pain: Secondary | ICD-10-CM | POA: Diagnosis not present

## 2016-12-13 DIAGNOSIS — M5416 Radiculopathy, lumbar region: Secondary | ICD-10-CM | POA: Diagnosis not present

## 2016-12-17 DIAGNOSIS — M5416 Radiculopathy, lumbar region: Secondary | ICD-10-CM | POA: Diagnosis not present

## 2016-12-17 DIAGNOSIS — M545 Low back pain: Secondary | ICD-10-CM | POA: Diagnosis not present

## 2016-12-27 DIAGNOSIS — Z23 Encounter for immunization: Secondary | ICD-10-CM | POA: Diagnosis not present

## 2017-01-03 DIAGNOSIS — M25561 Pain in right knee: Secondary | ICD-10-CM | POA: Diagnosis not present

## 2017-01-20 ENCOUNTER — Encounter: Payer: Self-pay | Admitting: Physical Therapy

## 2017-01-20 ENCOUNTER — Ambulatory Visit: Payer: Medicare Other | Attending: Orthopedic Surgery | Admitting: Physical Therapy

## 2017-01-20 DIAGNOSIS — M62830 Muscle spasm of back: Secondary | ICD-10-CM | POA: Insufficient documentation

## 2017-01-20 DIAGNOSIS — R262 Difficulty in walking, not elsewhere classified: Secondary | ICD-10-CM

## 2017-01-20 DIAGNOSIS — M5441 Lumbago with sciatica, right side: Secondary | ICD-10-CM | POA: Insufficient documentation

## 2017-01-20 NOTE — Therapy (Signed)
[address] [place], [place], [address] Phone: [phone]   Fax:  [phone]  Physical Therapy Evaluation  Patient Details  Name: [name] MRN: [id number] Date of Birth: 11/13/1926 Referring Provider: Lynann Bologna   Encounter Date: 01/20/2017  PT End of Session - 01/20/17 1119    Visit Number  1    Date for PT Re-Evaluation  03/22/17    PT Start Time  1050    PT Stop Time  1145    PT Time Calculation (min)  55 min    Activity Tolerance  Patient tolerated treatment well    Behavior During Therapy  [facility] for tasks assessed/performed       Past Medical History:  Diagnosis Date  . CAD (coronary artery disease)   . DUB (dysfunctional uterine bleeding)   . Elevated cholesterol   . GERD (gastroesophageal reflux disease)   . Hypertension   . Plantar fasciitis   . Shingles     Past Surgical History:  Procedure Laterality Date  . APPENDECTOMY  2011  . CARDIAC STINT IMPLANT  2008  . CARPAL TUNNEL RELEASE    . EYE SURGERY    . INGUINAL HERNIA REPAIR    . OOPHORECTOMY  2001   LAVH,BSO  . ULNA NERVE RELEASE  2009  . VAGINAL HYSTERECTOMY  2001   LAVH, BSO    There were no vitals filed for this visit.   Subjective Assessment - 01/20/17 1052    Subjective  Patient reports that she remembers having pain in the low back pain and right leg pain since August, she is unsure of a specific cause but reports lifting a case of water, and also pushing husband up a ramp in wheelchair.  She reports having prednisone and a shot, this helped some she reports that she is still having some pain    Patient Stated Goals  have less pain, sleep better    Currently in Pain?  Yes    Pain Location  Back right knee    right knee    Pain Orientation  Right;Lower    Pain Descriptors / Indicators  Aching    Pain Type  Acute pain    Pain Onset  More than a month ago    Pain Frequency  Intermittent    Aggravating Factors    sleeping, standing will increase pain mostly in the right knee a 10/10    Pain Relieving Factors  reports that typically she can get the pain to go away with motions but it is much harder when it wakes her up at night    Effect of Pain on Daily Activities  difficulty sleeping, some difficulty with ADL's         [facility] PT Assessment - 01/20/17 0001      Assessment   Medical Diagnosis  lumbar stenosis, pain down right LE    Referring Provider  Voytek    Onset Date/Surgical Date  10/20/16      Precautions   Precautions  None      Balance Screen   Has the patient fallen in the past 6 months  No    Has the patient had a decrease in activity level because of a fear of falling?   No    Is the patient reluctant to leave their home because of a fear of falling?   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    Biomedical scientist  at [employer], mostly sitting    Leisure  no exercise      Posture/Postural Control   Posture Comments  fwd head, kyphotic posture, seems to have some scoliosis, has a rib hump on the right      ROM / Strength   AROM / PROM / Strength  AROM;Strength      AROM   Overall AROM Comments  lumbar ROM was decreased 50% for flexion, decreased 100% for extension, decreased 75% for side bending, had pain iwth extension and left side bending      Strength   Overall Strength Comments  4-/5 with some right low back pain      Flexibility   Soft Tissue Assessment /Muscle Length  -- some HS and piriformis tightness   some HS and piriformis tightness     Palpation   Palpation comment  tight and very tender in the right lumbar area and into the right buttock             Objective measurements completed on examination: See above findings.      [place] Adult PT  Treatment/Exercise - 01/20/17 0001      Modalities   Modalities  Electrical Stimulation;Moist Heat      Moist Heat Therapy   Number Minutes Moist Heat  15 Minutes    Moist Heat Location  Lumbar Spine      Electrical Stimulation   Electrical Stimulation Location  right lumbar / buttock area    Electrical Stimulation Action  IFC    Electrical Stimulation Parameters  supine    Electrical Stimulation Goals  Pain             PT Education - 01/20/17 1119    Education provided  Yes    Education Details  Wms flexion    Person(s) Educated  Patient    Methods  Explanation;Demonstration;Handout;Verbal cues    Comprehension  Verbalized understanding       PT Short Term Goals - 01/20/17 1124      PT SHORT TERM GOAL #1   Title  independent with initial HEP    Time  2    Period  Weeks    Status  New        PT Long Term Goals - 01/20/17 1125      PT LONG TERM GOAL #1   Title  understand proper posture and body mechanics    Time  8    Period  Weeks    Status  New      PT LONG TERM GOAL #2   Title  decrease pain 50% at night    Time  8    Period  Weeks    Status  New      PT LONG TERM GOAL #3   Title  increase lumbar ROM 25%    Time  8    Period  Weeks    Status  New      PT LONG TERM GOAL #4   Title  report no difficulty with standing to shop or cook    Time  8    Period  Weeks    Status  New  Plan - 01/20/17 1120    Clinical Impression Statement  Patient reports that she started having right leg and low back pain in August, no specific cause, has lumbar stenosis and DDD.  She is tender and tight in the right lumbar and buttock area.  Lumbar ROM was decreased 100% for extension and 75% for side bending, both of these caused pain, her biggest issue is pain in the right knee at night    Clinical Presentation  Stable    Clinical Decision Making  Low    Rehab Potential  Good    PT Frequency  2x / week    PT Duration  8 weeks    PT  Treatment/Interventions  ADLs/Self Care Home Management;Electrical Stimulation;Moist Heat;Balance training;Therapeutic exercise;Therapeutic activities;Patient/family education;Functional mobility training;Manual techniques    PT Next Visit Plan  see how HEP is going, add easy core and LE strengthening    Consulted and Agree with Plan of Care  Patient       Patient will benefit from skilled therapeutic intervention in order to improve the following deficits and impairments:  Decreased activity tolerance, Decreased balance, Decreased mobility, Decreased strength, Postural dysfunction, Improper body mechanics, Impaired flexibility, Pain, Increased muscle spasms, Decreased range of motion, Difficulty walking  Visit Diagnosis: Acute right-sided low back pain with right-sided sciatica - Plan: PT plan of care cert/re-cert  Difficulty in walking, not elsewhere classified - Plan: PT plan of care cert/re-cert  Muscle spasm of back - Plan: PT plan of care cert/re-cert  G-Codes - 49/44/96 1126    Functional Assessment Tool Used (Outpatient Only)  foto 51% limitation    Functional Limitation  Mobility: Walking and moving around    Mobility: Walking and Moving Around Current Status ([id number])  At least 40 percent but less than 60 percent impaired, limited or restricted    Mobility: Walking and Moving Around Goal Status [phone])  At least 20 percent but less than 40 percent impaired, limited or restricted        Problem List Patient Active Problem List   Diagnosis Date Noted  . Left knee pain 04/28/2015  . Atherosclerosis of native coronary artery of native heart without angina pectoris 11/22/2013  . Essential hypertension 11/22/2013  . Pure hypercholesterolemia 11/22/2013  . Inversion, nipple 09/05/2013  . Left ankle injury 02/14/2013  . Right foot pain 06/08/2011  . Shingles   . Elevated cholesterol   . Cancer ([place])   . Right knee pain 05/10/2011    Sumner Boast., PT 01/20/2017, 11:36  AM  [address] [place], [place], [address] Phone: [phone]   Fax:  [phone]  Name: [name] MRN: [id number] Date of Birth: [birth year]

## 2017-01-21 ENCOUNTER — Encounter: Payer: Self-pay | Admitting: Cardiology

## 2017-01-28 ENCOUNTER — Ambulatory Visit: Payer: Medicare Other | Admitting: Physical Therapy

## 2017-01-28 ENCOUNTER — Encounter: Payer: Self-pay | Admitting: Physical Therapy

## 2017-01-28 DIAGNOSIS — M5441 Lumbago with sciatica, right side: Secondary | ICD-10-CM

## 2017-01-28 DIAGNOSIS — M62830 Muscle spasm of back: Secondary | ICD-10-CM | POA: Diagnosis not present

## 2017-01-28 DIAGNOSIS — R262 Difficulty in walking, not elsewhere classified: Secondary | ICD-10-CM

## 2017-01-28 NOTE — Therapy (Signed)
[place], [place], [address] Phone: [phone]   Fax:  [phone]  Physical Therapy Treatment  Patient Details  Name: [name] MRN: [id number] Date of Birth: 1926/05/26 Referring Provider: Lynann Bologna   Encounter Date: 01/28/2017  PT End of Session - 01/28/17 1113    Visit Number  2    Date for PT Re-Evaluation  03/22/17    PT Start Time  1110    PT Stop Time  1150    PT Time Calculation (min)  40 min       Past Medical History:  Diagnosis Date  . CAD (coronary artery disease)   . DUB (dysfunctional uterine bleeding)   . Elevated cholesterol   . GERD (gastroesophageal reflux disease)   . Hypertension   . Plantar fasciitis   . Shingles     Past Surgical History:  Procedure Laterality Date  . APPENDECTOMY  2011  . CARDIAC STINT IMPLANT  2008  . CARPAL TUNNEL RELEASE    . EYE SURGERY    . INGUINAL HERNIA REPAIR    . OOPHORECTOMY  2001   LAVH,BSO  . ULNA NERVE RELEASE  2009  . VAGINAL HYSTERECTOMY  2001   LAVH, BSO    There were no vitals filed for this visit.  Subjective Assessment - 01/28/17 1107    Subjective  10 min late,verb been on trip and has not done HEP    Currently in Pain?  Yes    Pain Score  2     Pain Location  Back                      [facility] Adult PT Treatment/Exercise - 01/28/17 0001      Exercises   Exercises  Lumbar      Lumbar Exercises: Aerobic   Stationary Bike  Nustep L 3 6 min      Lumbar Exercises: Supine   Ab Set  15 reps    Clam  15 reps    Clam Limitations  add ball squeeze    Bridge  15 reps with ball and KTC    Large Ball Oblique Isometric Limitations  15      Modalities   Modalities  Electrical Stimulation      Moist Heat Therapy   Number Minutes Moist Heat  15 Minutes    Moist Heat Location  Lumbar Spine      Electrical Stimulation   Electrical Stimulation Location  right lumbar / buttock area    Electrical  Stimulation Action  IFC    Electrical Stimulation Parameters  supine    Electrical Stimulation Goals  Pain             PT Education - 01/28/17 1110    Education provided  Yes    Education Details  reviewed initial HEP    Person(s) Educated  Patient    Methods  Explanation;Demonstration    Comprehension  Returned demonstration;Verbal cues required       PT Short Term Goals - 01/28/17 1111      PT SHORT TERM GOAL #1   Title  independent with initial HEP    Baseline  after review    Status  Achieved        PT Long Term Goals - 01/20/17 1125      PT LONG TERM GOAL #1   Title  understand proper posture and body mechanics  Time  8    Period  Weeks    Status  New      PT LONG TERM GOAL #2   Title  decrease pain 50% at night    Time  8    Period  Weeks    Status  New      PT LONG TERM GOAL #3   Title  increase lumbar ROM 25%    Time  8    Period  Weeks    Status  New      PT LONG TERM GOAL #4   Title  report no difficulty with standing to shop or cook    Time  8    Period  Weeks    Status  New            Plan - 01/28/17 1111    Clinical Impression Statement  STG for HEP met after review. tolerated lumb stab ex well with cuing needed. No changes with pain or ROM as only 2nd visit    PT Treatment/Interventions  ADLs/Self Care Home Management;Electrical Stimulation;Moist Heat;Balance training;Therapeutic exercise;Therapeutic activities;Patient/family education;Functional mobility training;Manual techniques    PT Next Visit Plan  see how HEP is going, add easy core and LE strengthening       Patient will benefit from skilled therapeutic intervention in order to improve the following deficits and impairments:  Decreased activity tolerance, Decreased balance, Decreased mobility, Decreased strength, Postural dysfunction, Improper body mechanics, Impaired flexibility, Pain, Increased muscle spasms, Decreased range of motion, Difficulty walking  Visit  Diagnosis: Acute right-sided low back pain with right-sided sciatica  Difficulty in walking, not elsewhere classified  Muscle spasm of back     Problem List Patient Active Problem List   Diagnosis Date Noted  . Left knee pain 04/28/2015  . Atherosclerosis of native coronary artery of native heart without angina pectoris 11/22/2013  . Essential hypertension 11/22/2013  . Pure hypercholesterolemia 11/22/2013  . Inversion, nipple 09/05/2013  . Left ankle injury 02/14/2013  . Right foot pain 06/08/2011  . Shingles   . Elevated cholesterol   . Cancer ([place])   . Right knee pain 05/10/2011    Bao Bazen,[name] PTA 01/28/2017, 11:30 AM  [address] [place], [place], [address] Phone: [phone]   Fax:  [phone]  Name: [name] MRN: [id number] Date of Birth: 1926/07/22

## 2017-02-01 ENCOUNTER — Encounter: Payer: Self-pay | Admitting: Physical Therapy

## 2017-02-01 ENCOUNTER — Ambulatory Visit: Payer: Medicare Other | Admitting: Physical Therapy

## 2017-02-01 ENCOUNTER — Ambulatory Visit (INDEPENDENT_AMBULATORY_CARE_PROVIDER_SITE_OTHER): Payer: Medicare Other | Admitting: Cardiology

## 2017-02-01 ENCOUNTER — Encounter: Payer: Self-pay | Admitting: Cardiology

## 2017-02-01 VITALS — BP 130/78 | HR 65 | Ht 64.0 in | Wt 145.8 lb

## 2017-02-01 DIAGNOSIS — I10 Essential (primary) hypertension: Secondary | ICD-10-CM | POA: Diagnosis not present

## 2017-02-01 DIAGNOSIS — M62830 Muscle spasm of back: Secondary | ICD-10-CM | POA: Diagnosis not present

## 2017-02-01 DIAGNOSIS — R262 Difficulty in walking, not elsewhere classified: Secondary | ICD-10-CM | POA: Diagnosis not present

## 2017-02-01 DIAGNOSIS — M5441 Lumbago with sciatica, right side: Secondary | ICD-10-CM

## 2017-02-01 DIAGNOSIS — I2510 Atherosclerotic heart disease of native coronary artery without angina pectoris: Secondary | ICD-10-CM

## 2017-02-01 MED ORDER — NITROGLYCERIN 0.4 MG SL SUBL: 0.4000 mg | SUBLINGUAL_TABLET | SUBLINGUAL | 3 refills | Status: DC | PRN

## 2017-02-01 NOTE — Patient Instructions (Signed)

## 2017-02-01 NOTE — Therapy (Signed)
[place], [place], [address] Phone: [phone]   Fax:  [phone]  Physical Therapy Treatment  Patient Details  Name: [name] MRN: [id number] Date of Birth: July 12, 1926 Referring Provider: Lynann Bologna   Encounter Date: 02/01/2017  PT End of Session - 02/01/17 1415    Visit Number  3    Date for PT Re-Evaluation  03/22/17    PT Start Time  4259    PT Stop Time  1438    PT Time Calculation (min)  53 min       Past Medical History:  Diagnosis Date  . CAD (coronary artery disease)   . DUB (dysfunctional uterine bleeding)   . Elevated cholesterol   . GERD (gastroesophageal reflux disease)   . Hypertension   . Plantar fasciitis   . Shingles     Past Surgical History:  Procedure Laterality Date  . APPENDECTOMY  2011  . CARDIAC STINT IMPLANT  2008  . CARPAL TUNNEL RELEASE    . EYE SURGERY    . INGUINAL HERNIA REPAIR    . OOPHORECTOMY  2001   LAVH,BSO  . ULNA NERVE RELEASE  2009  . VAGINAL HYSTERECTOMY  2001   LAVH, BSO    There were no vitals filed for this visit.  Subjective Assessment - 02/01/17 1346    Subjective  very sore after last session. verb she has not been able to ex like she wants too    Currently in Pain?  Yes    Pain Score  1     Pain Location  Back         [facility] PT Assessment - 02/01/17 0001      AROM   Overall AROM Comments  lumbar ROM decreased 50% for flexion, decreased 100% for extension, decreased 75% for side bending, had pain iwth extension and left side bending                  [facility] Adult PT Treatment/Exercise - 02/01/17 0001      Lumbar Exercises: Aerobic   Stationary Bike  Nustep L 3 6 min      Lumbar Exercises: Standing   Other Standing Lumbar Exercises  on airex hip ext and abd 10 each      Lumbar Exercises: Seated   Other Seated Lumbar Exercises  pelvic ROM on sit fit [address] LAQ,hip flex and abd on sit fit 10 times     Other Seated Lumbar Exercises  yellow tband retraction and ext on sit fit      Lumbar Exercises: Supine   Ab Set  15 reps    Clam Limitations  add ball squeeze      Modalities   Modalities  Electrical Stimulation      Moist Heat Therapy   Number Minutes Moist Heat  15 Minutes    Moist Heat Location  Lumbar Spine      Electrical Stimulation   Electrical Stimulation Location  LB    Electrical Stimulation Action  IFC    Electrical Stimulation Parameters  supine    Electrical Stimulation Goals  Pain      Manual Therapy   Manual Therapy  Passive ROM    Passive ROM  LE and trunk               PT Short Term Goals - 01/28/17 1111      PT SHORT TERM GOAL #1   Title  independent with initial HEP    Baseline  after review    Status  Achieved        PT Long Term Goals - 02/01/17 1415      PT LONG TERM GOAL #1   Title  understand proper posture and body mechanics    Status  On-going      PT LONG TERM GOAL #2   Title  decrease pain 50% at night    Status  On-going      PT LONG TERM GOAL #3   Title  increase lumbar ROM 25%    Status  On-going      PT LONG TERM GOAL #4   Title  report no difficulty with standing to shop or cook    Status  On-going            Plan - 02/01/17 1416    Clinical Impression Statement  pt arrived with no pain, 4/10 after ther ex. Postural cuing needed with ex. Tight with stretches and cuing to relax and stressed importance of doing at home. ( pt request add'l ex for home but not doing basic stretches). Progressing towards LTGs    PT Treatment/Interventions  ADLs/Self Care Home Management;Electrical Stimulation;Moist Heat;Balance training;Therapeutic exercise;Therapeutic activities;Patient/family education;Functional mobility training;Manual techniques    PT Next Visit Plan  see how HEP is going, add easy core and LE strengthening       Patient will benefit from skilled therapeutic intervention in order to improve the following  deficits and impairments:  Decreased activity tolerance, Decreased balance, Decreased mobility, Decreased strength, Postural dysfunction, Improper body mechanics, Impaired flexibility, Pain, Increased muscle spasms, Decreased range of motion, Difficulty walking  Visit Diagnosis: Acute right-sided low back pain with right-sided sciatica  Difficulty in walking, not elsewhere classified  Muscle spasm of back     Problem List Patient Active Problem List   Diagnosis Date Noted  . Left knee pain 04/28/2015  . Atherosclerosis of native coronary artery of native heart without angina pectoris 11/22/2013  . Essential hypertension 11/22/2013  . Pure hypercholesterolemia 11/22/2013  . Inversion, nipple 09/05/2013  . Left ankle injury 02/14/2013  . Right foot pain 06/08/2011  . Shingles   . Elevated cholesterol   . Cancer ([place])   . Right knee pain 05/10/2011    [name],[name] PTA 02/01/2017, 2:19 PM  [place] [address] [place], [place], [address] Phone: [phone]   Fax:  [phone]  Name: [name] MRN: [id number] Date of Birth: December 04, 1926

## 2017-02-01 NOTE — Progress Notes (Signed)
[place] [address] [place], [place]  [address] Phone: [phone] Fax:  [phone]  Date:  02/01/2017   ID:  [name], DOB [birth year], MRN [id number]  PCP:  Marton Redwood, MD   History of Present Illness: [name] is a 83 y.o. female with coronary artery disease following DES to proximal RCA in August of 2008 after presenting with typical angina, followup stress test in 2009 showed no ischemia. She has also had cutting balloon to distal RCA/posterior lateral branch.  Stress Cardiolyte, normal with no inducible ischemia, EF 85% Normal max effort GXT, 05/2007   Overall she is doing very well without any symptoms, no chest pain, no shortness of breath, no bleeding, no syncope.   Dr. Brigitte Pulse changed her from simvastatin to atorvastatin given her LDL of 80.  02/01/17 -occasional throat discomfort with heavy exertional activity but this is rare.  No chest pain, no shortness of breath.  She has not used any nitroglycerin.  No orthopnea, no PND.   Wt Readings from Last 3 Encounters:  02/01/17 145 lb 12.8 oz (66.1 kg)  09/08/16 141 lb 3.2 oz (64 kg)  01/12/16 138 lb 6.4 oz (62.8 kg)     Past Medical History:  Diagnosis Date  . CAD (coronary artery disease)   . DUB (dysfunctional uterine bleeding)   . Elevated cholesterol   . GERD (gastroesophageal reflux disease)   . Hypertension   . Plantar fasciitis   . Shingles     Past Surgical History:  Procedure Laterality Date  . APPENDECTOMY  2011  . CARDIAC STINT IMPLANT  2008  . CARPAL TUNNEL RELEASE    . EYE SURGERY    . INGUINAL HERNIA REPAIR    . OOPHORECTOMY  2001   LAVH,BSO  . ULNA NERVE RELEASE  2009  . VAGINAL HYSTERECTOMY  2001   LAVH, BSO    Current Outpatient Medications  Medication Sig Dispense Refill  . Ascorbic Acid (VITAMIN C) 1000 MG tablet Take 1,000 mg by mouth daily.    Marland Kitchen aspirin 81 MG tablet Take 81 mg by mouth daily.      Marland Kitchen atorvastatin (LIPITOR) 40 MG tablet     .  Calcium-Vitamin D (CALTRATE 600 PLUS-VIT D PO) Take 1 tablet by mouth 2 (two) times daily.    . cetirizine (ZYRTEC) 10 MG tablet Take 10 mg by mouth daily.      . Cholecalciferol (VITAMIN D) 2000 units CAPS Take 2,000 Units by mouth daily.    . clorazepate (TRANXENE) 7.5 MG tablet Take 7.5 mg by mouth once as needed. For nerves    . Coenzyme Q10 (CO Q 10 PO) Take 1 tablet by mouth every morning.    Marland Kitchen glucosamine-chondroitin 500-400 MG tablet Take 1 tablet by mouth 2 (two) times daily.     Marland Kitchen lisinopril (PRINIVIL,ZESTRIL) 40 MG tablet Take 40 mg by mouth daily.    . nitroGLYCERIN (NITROSTAT) 0.4 MG SL tablet Place 1 tablet (0.4 mg total) under the tongue every 5 (five) minutes as needed for chest pain. 25 tablet 3  . pantoprazole (PROTONIX) 40 MG tablet      No current facility-administered medications for this visit.     Allergies:    Allergies  Allergen Reactions  . Sulfa Antibiotics Nausea And Vomiting    Social History:  The patient  reports that she has quit smoking. she has never used smokeless tobacco. She reports that she does not drink alcohol or  use drugs.   Family History  Problem Relation Age of Onset  . Heart attack Mother   . Hypertension Mother   . Heart attack Father   . CAD Father   . Heart disease Brother   . Hypertension Brother   . Diabetes Brother   . COPD Brother   . Asthma Brother   . Breast cancer Neg Hx     ROS:  Please see the history of present illness.   Denies any syncope, chest pain, orthopnea, PND   All other systems reviewed and negative.   PHYSICAL EXAM: VS:  BP 130/78   Pulse 65   Ht 5\' 4"  (1.626 m)   Wt 145 lb 12.8 oz (66.1 kg)   LMP  (LMP Unknown)   SpO2 96%   BMI 25.03 kg/m  Well nourished, well developed, in no acute distress  HEENT: normal, [place]/AT, EOMI Neck: no JVD, normal carotid upstroke, no bruit Cardiac:  normal S1, S2; RRR; 1/6 SM  Lungs:  clear to auscultation bilaterally, no wheezing, rhonchi or rales  Abd: soft,  nontender, no hepatomegaly, no bruits  Ext: no edema, 2+ distal pulses Skin: warm and dry Varicose veins GU: deferred Neuro: no focal abnormalities noted, AAO x 3  EKG: Today 02/01/17-sinus rhythm 65 with no other abnormalities 01/12/16-sinus rhythm, 63, rightward axis personally viewed-prior 12/16/14-sinus rhythm, 64, no other abnormalities personally viewed-prior 11/22/13-sinus rhythm rate 60 with low voltage     ASSESSMENT AND PLAN:  1. Coronary artery disease-drug-eluting stent to RCA. Subsequent nuclear stress test low risk, no ischemia. Doing well, no angina.  Occasionally she will feel some minor throat tightness.. Continue with secondary prevention. 2. Hyperlipidemia-last LDL was 68.  Excellent Dr. Brigitte Pulse changed her to atorvastatin. Previously, simvastatin 20-LDL 70 (Dr. Valere Dross results).  Doing well, no myalgias. 3. Essential hypertension-very well controlled on lisinopril 40. No changes made. Previously increased by Dr. Brigitte Pulse.  4. One year follow up.  Signed, Candee Furbish, MD [facility]  02/01/2017 11:51 AM

## 2017-02-09 ENCOUNTER — Encounter: Payer: Self-pay | Admitting: Physical Therapy

## 2017-02-09 ENCOUNTER — Ambulatory Visit: Payer: Medicare Other | Admitting: Physical Therapy

## 2017-02-09 DIAGNOSIS — R262 Difficulty in walking, not elsewhere classified: Secondary | ICD-10-CM | POA: Diagnosis not present

## 2017-02-09 DIAGNOSIS — M5441 Lumbago with sciatica, right side: Secondary | ICD-10-CM

## 2017-02-09 DIAGNOSIS — M62830 Muscle spasm of back: Secondary | ICD-10-CM | POA: Diagnosis not present

## 2017-02-09 NOTE — Therapy (Signed)
[place], [place], [address] Phone: [phone]   Fax:  [phone]  Physical Therapy Treatment  Patient Details  Name: [name] MRN: [id number] Date of Birth: [birth year] Referring Provider: Lynann Bologna   Encounter Date: 02/09/2017  PT End of Session - 02/09/17 1200    Visit Number  4    Date for PT Re-Evaluation  03/22/17    PT Start Time  7989    PT Stop Time  1230    PT Time Calculation (min)  45 min       Past Medical History:  Diagnosis Date  . CAD (coronary artery disease)   . DUB (dysfunctional uterine bleeding)   . Elevated cholesterol   . GERD (gastroesophageal reflux disease)   . Hypertension   . Plantar fasciitis   . Shingles     Past Surgical History:  Procedure Laterality Date  . APPENDECTOMY  2011  . CARDIAC STINT IMPLANT  2008  . CARPAL TUNNEL RELEASE    . EYE SURGERY    . INGUINAL HERNIA REPAIR    . OOPHORECTOMY  2001   LAVH,BSO  . ULNA NERVE RELEASE  2009  . VAGINAL HYSTERECTOMY  2001   LAVH, BSO    There were no vitals filed for this visit.  Subjective Assessment - 02/09/17 1145    Subjective  pain was an 8 or 9 this morning after vacuuming and cleaning shower/tub    Currently in Pain?  Yes    Pain Score  3     Pain Location  Back         [facility] PT Assessment - 02/09/17 0001      AROM   Overall AROM Comments  decreased 50% ext and SB and 25% flex                  [facility] Adult PT Treatment/Exercise - 02/09/17 0001      Self-Care   Self-Care  ADL's;Posture;Other Self-Care Comments vacuuming and tub cleaning      Lumbar Exercises: Aerobic   Stationary Bike  Nustep L 3 6 min      Lumbar Exercises: Machines for Strengthening   Cybex Lumbar Extension  5# 2 sets 8    Cybex Knee Extension  15# 2 sets 10    Leg Press  20# 2 sets 10      Lumbar Exercises: Standing   Other Standing Lumbar Exercises  red tband scap retraction and ext 15  each      Lumbar Exercises: Seated   Other Seated Lumbar Exercises  red tband trunk flex 10 and ext 10 times      Lumbar Exercises: Supine   Ab Set  15 reps      Moist Heat Therapy   Number Minutes Moist Heat  15 Minutes    Moist Heat Location  Lumbar Spine      Electrical Stimulation   Electrical Stimulation Location  LB    Electrical Stimulation Action  IFC    Electrical Stimulation Parameters  supine    Electrical Stimulation Goals  Pain               PT Short Term Goals - 01/28/17 1111      PT SHORT TERM GOAL #1   Title  independent with initial HEP    Baseline  after review    Status  Achieved        PT Long Term Goals -  02/09/17 1154      PT LONG TERM GOAL #1   Title  understand proper posture and body mechanics    Status  Partially Met      PT LONG TERM GOAL #2   Title  decrease pain 50% at night    Baseline  c/o RT knee pain at night ? nerve    Status  On-going      PT LONG TERM GOAL #3   Title  increase lumbar ROM 25%    Status  Achieved      PT LONG TERM GOAL #4   Title  report no difficulty with standing to shop or cook    Status  On-going            Plan - 02/09/17 1201    Clinical Impression Statement  progressing with trunk ROM and goal met, instructed in correct home ADLS and pt VU. started wt machines for strengtheing todya and tolerated well.    PT Treatment/Interventions  ADLs/Self Care Home Management;Electrical Stimulation;Moist Heat;Balance training;Therapeutic exercise;Therapeutic activities;Patient/family education;Functional mobility training;Manual techniques    PT Next Visit Plan  progress HEP       Patient will benefit from skilled therapeutic intervention in order to improve the following deficits and impairments:  Decreased activity tolerance, Decreased balance, Decreased mobility, Decreased strength, Postural dysfunction, Improper body mechanics, Impaired flexibility, Pain, Increased muscle spasms, Decreased range of  motion, Difficulty walking  Visit Diagnosis: Acute right-sided low back pain with right-sided sciatica  Difficulty in walking, not elsewhere classified  Muscle spasm of back     Problem List Patient Active Problem List   Diagnosis Date Noted  . Left knee pain 04/28/2015  . Atherosclerosis of native coronary artery of native heart without angina pectoris 11/22/2013  . Essential hypertension 11/22/2013  . Pure hypercholesterolemia 11/22/2013  . Inversion, nipple 09/05/2013  . Left ankle injury 02/14/2013  . Right foot pain 06/08/2011  . Shingles   . Elevated cholesterol   . Cancer ([place])   . Right knee pain 05/10/2011    PAYSEUR,[name] PTA 02/09/2017, 12:09 PM  [place] [address] [place], [place], [address] Phone: [phone]   Fax:  [phone]  Name: Conner Neiss MRN: [id number] Date of Birth: Oct 31, 1926

## 2017-02-15 ENCOUNTER — Encounter: Payer: Self-pay | Admitting: Physical Therapy

## 2017-02-15 ENCOUNTER — Ambulatory Visit: Payer: Medicare Other | Attending: Orthopedic Surgery | Admitting: Physical Therapy

## 2017-02-15 DIAGNOSIS — R262 Difficulty in walking, not elsewhere classified: Secondary | ICD-10-CM | POA: Diagnosis not present

## 2017-02-15 DIAGNOSIS — M62830 Muscle spasm of back: Secondary | ICD-10-CM | POA: Diagnosis not present

## 2017-02-15 DIAGNOSIS — M5441 Lumbago with sciatica, right side: Secondary | ICD-10-CM | POA: Diagnosis not present

## 2017-02-15 NOTE — Therapy (Signed)
[place], [place], [address] Phone: [phone]   Fax:  [phone]  Physical Therapy Treatment  Patient Details  Name: [name] MRN: [id number] Date of Birth: 1926/08/28 Referring Provider: Lynann Bologna   Encounter Date: 02/15/2017  PT End of Session - 02/15/17 1148    Visit Number  5    Date for PT Re-Evaluation  03/22/17    PT Start Time  1130    PT Stop Time  1225    PT Time Calculation (min)  55 min       Past Medical History:  Diagnosis Date  . CAD (coronary artery disease)   . DUB (dysfunctional uterine bleeding)   . Elevated cholesterol   . GERD (gastroesophageal reflux disease)   . Hypertension   . Plantar fasciitis   . Shingles     Past Surgical History:  Procedure Laterality Date  . APPENDECTOMY  2011  . CARDIAC STINT IMPLANT  2008  . CARPAL TUNNEL RELEASE    . EYE SURGERY    . INGUINAL HERNIA REPAIR    . OOPHORECTOMY  2001   LAVH,BSO  . ULNA NERVE RELEASE  2009  . VAGINAL HYSTERECTOMY  2001   LAVH, BSO    There were no vitals filed for this visit.  Subjective Assessment - 02/15/17 1133    Subjective  pain varies with activity and stress level    Currently in Pain?  Yes    Pain Score  4     Pain Location  Back                      [facility] Adult PT Treatment/Exercise - 02/15/17 0001      Lumbar Exercises: Aerobic   Stationary Bike  Nustep L 5 6 min      Lumbar Exercises: Machines for Strengthening   Cybex Knee Extension  5# 2 sets 10    Cybex Knee Flexion  15# 2 sets 10    Leg Press  20# 2 sets 15      Lumbar Exercises: Standing   Other Standing Lumbar Exercises  red tband scap retraction and ext 15 each    Other Standing Lumbar Exercises  red tband hip ext and abd 15 each      Lumbar Exercises: Seated   Sit to Stand  10 reps    Other Seated Lumbar Exercises  red tband trunk flex 10 and ext 10 times      Moist Heat Therapy   Number  Minutes Moist Heat  15 Minutes    Moist Heat Location  Lumbar Spine      Electrical Stimulation   Electrical Stimulation Location  LB    Electrical Stimulation Action  IFC    Electrical Stimulation Parameters  supine    Electrical Stimulation Goals  Pain               PT Short Term Goals - 01/28/17 1111      PT SHORT TERM GOAL #1   Title  independent with initial HEP    Baseline  after review    Status  Achieved        PT Long Term Goals - 02/09/17 1154      PT LONG TERM GOAL #1   Title  understand proper posture and body mechanics    Status  Partially Met      PT LONG TERM GOAL #2   Title  decrease pain 50% at night    Baseline  c/o RT knee pain at night ? nerve    Status  On-going      PT LONG TERM GOAL #3   Title  increase lumbar ROM 25%    Status  Achieved      PT LONG TERM GOAL #4   Title  report no difficulty with standing to shop or cook    Status  On-going            Plan - 02/15/17 1148    Clinical Impression Statement  pt tolerated increased reps and resistance today, fatigue but no increased c/o pain. noted improved upright posture without cuing. pt continues to get excellent relief with estim and MH    PT Treatment/Interventions  ADLs/Self Care Home Management;Electrical Stimulation;Moist Heat;Balance training;Therapeutic exercise;Therapeutic activities;Patient/family education;Functional mobility training;Manual techniques    PT Next Visit Plan  progress HEP       Patient will benefit from skilled therapeutic intervention in order to improve the following deficits and impairments:  Decreased activity tolerance, Decreased balance, Decreased mobility, Decreased strength, Postural dysfunction, Improper body mechanics, Impaired flexibility, Pain, Increased muscle spasms, Decreased range of motion, Difficulty walking  Visit Diagnosis: Acute right-sided low back pain with right-sided sciatica  Difficulty in walking, not elsewhere  classified  Muscle spasm of back     Problem List Patient Active Problem List   Diagnosis Date Noted  . Left knee pain 04/28/2015  . Atherosclerosis of native coronary artery of native heart without angina pectoris 11/22/2013  . Essential hypertension 11/22/2013  . Pure hypercholesterolemia 11/22/2013  . Inversion, nipple 09/05/2013  . Left ankle injury 02/14/2013  . Right foot pain 06/08/2011  . Shingles   . Elevated cholesterol   . Cancer ([place])   . Right knee pain 05/10/2011    PAYSEUR,[name] PTA 02/15/2017, 11:52 AM  [place] [address] [place], [place], [address] Phone: [phone]   Fax:  [phone]  Name: [name] MRN: [id number] Date of Birth: March 01, 1927

## 2017-02-16 ENCOUNTER — Telehealth: Payer: Self-pay

## 2017-02-16 NOTE — Telephone Encounter (Signed)
Error

## 2017-02-17 ENCOUNTER — Ambulatory Visit: Payer: Medicare Other | Admitting: Physical Therapy

## 2017-02-17 DIAGNOSIS — M62830 Muscle spasm of back: Secondary | ICD-10-CM | POA: Diagnosis not present

## 2017-02-17 DIAGNOSIS — R262 Difficulty in walking, not elsewhere classified: Secondary | ICD-10-CM

## 2017-02-17 DIAGNOSIS — M5441 Lumbago with sciatica, right side: Secondary | ICD-10-CM | POA: Diagnosis not present

## 2017-02-17 NOTE — Therapy (Signed)
[place], [place], [address] Phone: [phone]   Fax:  [phone]  Physical Therapy Treatment  Patient Details  Name: [name] MRN: [id number] Date of Birth: [birth year] Referring Provider: Lynann Bologna   Encounter Date: 02/17/2017  PT End of Session - 02/17/17 1449    Visit Number  6    Date for PT Re-Evaluation  03/22/17    PT Start Time  0768    PT Stop Time  1515    PT Time Calculation (min)  55 min       Past Medical History:  Diagnosis Date  . CAD (coronary artery disease)   . DUB (dysfunctional uterine bleeding)   . Elevated cholesterol   . GERD (gastroesophageal reflux disease)   . Hypertension   . Plantar fasciitis   . Shingles     Past Surgical History:  Procedure Laterality Date  . APPENDECTOMY  2011  . CARDIAC STINT IMPLANT  2008  . CARPAL TUNNEL RELEASE    . EYE SURGERY    . INGUINAL HERNIA REPAIR    . OOPHORECTOMY  2001   LAVH,BSO  . ULNA NERVE RELEASE  2009  . VAGINAL HYSTERECTOMY  2001   LAVH, BSO    There were no vitals filed for this visit.  Subjective Assessment - 02/17/17 1430    Subjective  good day today, PT is helping 50% better. Trying to use better BM with ADLs    Currently in Pain?  Yes    Pain Score  3     Pain Location  Back         [facility] PT Assessment - 02/17/17 0001      AROM   Overall AROM Comments  decreased ext and SB 25%      Strength   Overall Strength Comments  4/5 hips                  [facility] Adult PT Treatment/Exercise - 02/17/17 0001      Lumbar Exercises: Aerobic   Stationary Bike  Nustep L 5 7 min      Lumbar Exercises: Machines for Strengthening   Cybex Knee Extension  5# 2 sets 10    Cybex Knee Flexion  15# 2 sets 15    Leg Press  20# 2 sets 15      Lumbar Exercises: Standing   Other Standing Lumbar Exercises  red tband scap retraction and ext 15 each    Other Standing Lumbar Exercises  resisted gait  5 times fwd/back 3 times each side      Lumbar Exercises: Supine   Ab Set  15 reps    Clam Limitations  add ball squeeze               PT Short Term Goals - 01/28/17 1111      PT SHORT TERM GOAL #1   Title  independent with initial HEP    Baseline  after review    Status  Achieved        PT Long Term Goals - 02/17/17 1449      PT LONG TERM GOAL #1   Title  understand proper posture and body mechanics    Status  Partially Met      PT LONG TERM GOAL #2   Title  decrease pain 50% at night    Status  Achieved      PT LONG TERM GOAL #3  Title  increase lumbar ROM 25%    Status  Partially Met      PT LONG TERM GOAL #4   Title  report no difficulty with standing to shop or cook    Status  Partially Met            Plan - 02/17/17 1450    Clinical Impression Statement  progressing with ROM and strength and pain getting better. progressing with goals    PT Treatment/Interventions  ADLs/Self Care Home Management;Electrical Stimulation;Moist Heat;Balance training;Therapeutic exercise;Therapeutic activities;Patient/family education;Functional mobility training;Manual techniques    PT Next Visit Plan  progress HEP       Patient will benefit from skilled therapeutic intervention in order to improve the following deficits and impairments:  Decreased activity tolerance, Decreased balance, Decreased mobility, Decreased strength, Postural dysfunction, Improper body mechanics, Impaired flexibility, Pain, Increased muscle spasms, Decreased range of motion, Difficulty walking  Visit Diagnosis: Acute right-sided low back pain with right-sided sciatica  Difficulty in walking, not elsewhere classified  Muscle spasm of back     Problem List Patient Active Problem List   Diagnosis Date Noted  . Left knee pain 04/28/2015  . Atherosclerosis of native coronary artery of native heart without angina pectoris 11/22/2013  . Essential hypertension 11/22/2013  . Pure  hypercholesterolemia 11/22/2013  . Inversion, nipple 09/05/2013  . Left ankle injury 02/14/2013  . Right foot pain 06/08/2011  . Shingles   . Elevated cholesterol   . Cancer ([place])   . Right knee pain 05/10/2011    [place] Becvar,[name] PTA 02/17/2017, 2:51 PM  [place] [address] [place], [place], [address] Phone: [phone]   Fax:  [phone]  Name: [name] MRN: [id number] Date of Birth: April 13, 1926

## 2017-02-25 ENCOUNTER — Ambulatory Visit: Payer: Medicare Other | Admitting: Physical Therapy

## 2017-02-25 DIAGNOSIS — M5441 Lumbago with sciatica, right side: Secondary | ICD-10-CM

## 2017-02-25 DIAGNOSIS — R262 Difficulty in walking, not elsewhere classified: Secondary | ICD-10-CM | POA: Diagnosis not present

## 2017-02-25 DIAGNOSIS — M62830 Muscle spasm of back: Secondary | ICD-10-CM

## 2017-02-25 NOTE — Therapy (Signed)
[place], [place], [address] Phone: [phone]   Fax:  [phone]  Physical Therapy Treatment  Patient Details  Name: [name] MRN: [id number] Date of Birth: [birth year] Referring Provider: Lynann Bologna   Encounter Date: 02/25/2017  PT End of Session - 02/25/17 1059    Visit Number  7    Date for PT Re-Evaluation  03/22/17    PT Start Time  1030    PT Stop Time  1130    PT Time Calculation (min)  60 min       Past Medical History:  Diagnosis Date  . CAD (coronary artery disease)   . DUB (dysfunctional uterine bleeding)   . Elevated cholesterol   . GERD (gastroesophageal reflux disease)   . Hypertension   . Plantar fasciitis   . Shingles     Past Surgical History:  Procedure Laterality Date  . APPENDECTOMY  2011  . CARDIAC STINT IMPLANT  2008  . CARPAL TUNNEL RELEASE    . EYE SURGERY    . INGUINAL HERNIA REPAIR    . OOPHORECTOMY  2001   LAVH,BSO  . ULNA NERVE RELEASE  2009  . VAGINAL HYSTERECTOMY  2001   LAVH, BSO    There were no vitals filed for this visit.  Subjective Assessment - 02/25/17 1036    Subjective  good days and bad days- pain increases if increased activity    Currently in Pain?  Yes    Pain Score  3     Pain Location  Back         [facility] PT Assessment - 02/25/17 0001      AROM   Overall AROM Comments  decreased ext and SB 25%      Strength   Overall Strength Comments  4/5 hips                  [facility] Adult PT Treatment/Exercise - 02/25/17 0001      Lumbar Exercises: Aerobic   Stationary Bike  Nustep L 5 7 min      Lumbar Exercises: Machines for Strengthening   Cybex Lumbar Extension  black tband 2 sets 10    Cybex Knee Extension  5# 2 sets 10    Cybex Knee Flexion  15# 2 sets 15    Leg Press  20# 2 sets 15    Other Lumbar Machine Exercise  seated row 15# 2 sets 10    Other Lumbar Machine Exercise  lat pull 15# 2 sets 10              PT Education - 02/25/17 1057    Education provided  Yes    Education Details  alt kicks fwd,side and back,marching and side stepping    Person(s) Educated  Patient    Methods  Explanation;Demonstration;Handout    Comprehension  Verbalized understanding;Returned demonstration       PT Short Term Goals - 01/28/17 1111      PT SHORT TERM GOAL #1   Title  independent with initial HEP    Baseline  after review    Status  Achieved        PT Long Term Goals - 02/25/17 1058      PT LONG TERM GOAL #1   Title  understand proper posture and body mechanics    Status  Partially Met      PT LONG TERM GOAL #2   Title  decrease pain 50% at night    Status  Achieved      PT LONG TERM GOAL #3   Title  increase lumbar ROM 25%    Status  Partially Met      PT LONG TERM GOAL #4   Title  report no difficulty with standing to shop or cook    Status  Partially Met            Plan - 02/25/17 1059    Clinical Impression Statement  decreased pain but c/o pain witnh increased activity- explained needed d/t weakness so issued HEP for strengthening and pt tolerated ther ex well    PT Treatment/Interventions  ADLs/Self Care Home Management;Electrical Stimulation;Moist Heat;Balance training;Therapeutic exercise;Therapeutic activities;Patient/family education;Functional mobility training;Manual techniques    PT Next Visit Plan  progress core and hip strength       Patient will benefit from skilled therapeutic intervention in order to improve the following deficits and impairments:  Decreased activity tolerance, Decreased balance, Decreased mobility, Decreased strength, Postural dysfunction, Improper body mechanics, Impaired flexibility, Pain, Increased muscle spasms, Decreased range of motion, Difficulty walking  Visit Diagnosis: Acute right-sided low back pain with right-sided sciatica  Difficulty in walking, not elsewhere classified  Muscle spasm of back     Problem  List Patient Active Problem List   Diagnosis Date Noted  . Left knee pain 04/28/2015  . Atherosclerosis of native coronary artery of native heart without angina pectoris 11/22/2013  . Essential hypertension 11/22/2013  . Pure hypercholesterolemia 11/22/2013  . Inversion, nipple 09/05/2013  . Left ankle injury 02/14/2013  . Right foot pain 06/08/2011  . Shingles   . Elevated cholesterol   . Cancer (HCC)   . Right knee pain 05/10/2011    PAYSEUR,[name] PTA 02/25/2017, 11:01 AM   [facility] [address] [place], [place], [address] Phone: [phone]   Fax:  [phone]  Name: [name] MRN: [id number] Date of Birth: 11/23/1926   

## 2017-03-01 ENCOUNTER — Ambulatory Visit: Payer: Medicare Other | Admitting: Physical Therapy

## 2017-03-01 DIAGNOSIS — R262 Difficulty in walking, not elsewhere classified: Secondary | ICD-10-CM

## 2017-03-01 DIAGNOSIS — M62830 Muscle spasm of back: Secondary | ICD-10-CM

## 2017-03-01 DIAGNOSIS — M5441 Lumbago with sciatica, right side: Secondary | ICD-10-CM | POA: Diagnosis not present

## 2017-03-01 NOTE — Therapy (Signed)
[place], [place], [address] Phone: [phone]   Fax:  [phone]  Physical Therapy Treatment  Patient Details  Name: [name] MRN: [id number] Date of Birth: [birth year] Referring Provider: Lynann Bologna   Encounter Date: 03/01/2017  PT End of Session - 03/01/17 1352    Visit Number  8    Date for PT Re-Evaluation  03/22/17    PT Start Time  8938    PT Stop Time  1230    PT Time Calculation (min)  55 min       Past Medical History:  Diagnosis Date  . CAD (coronary artery disease)   . DUB (dysfunctional uterine bleeding)   . Elevated cholesterol   . GERD (gastroesophageal reflux disease)   . Hypertension   . Plantar fasciitis   . Shingles     Past Surgical History:  Procedure Laterality Date  . APPENDECTOMY  2011  . CARDIAC STINT IMPLANT  2008  . CARPAL TUNNEL RELEASE    . EYE SURGERY    . INGUINAL HERNIA REPAIR    . OOPHORECTOMY  2001   LAVH,BSO  . ULNA NERVE RELEASE  2009  . VAGINAL HYSTERECTOMY  2001   LAVH, BSO    There were no vitals filed for this visit.  Subjective Assessment - 03/01/17 1138    Subjective  overdid yesterday, not bad today    Currently in Pain?  Yes    Pain Score  3     Pain Location  Back                      [facility] Adult PT Treatment/Exercise - 03/01/17 0001      Lumbar Exercises: Aerobic   Stationary Bike  Nustep L 5 7 min      Lumbar Exercises: Machines for Strengthening   Cybex Lumbar Extension  black tband 2 sets 10 trunk flex black badn 2 sets 10    Cybex Knee Extension  5# 2 sets 10    Cybex Knee Flexion  15# 2 sets 15    Leg Press  20# 2 sets 15    Other Lumbar Machine Exercise  seated row 15# 2 sets 10    Other Lumbar Machine Exercise  lat pull 15# 2 sets 10      Lumbar Exercises: Supine   Ab Set  15 reps    Clam  15 reps    Clam Limitations  add ball squeeze      Moist Heat Therapy   Number Minutes Moist Heat   15 Minutes    Moist Heat Location  Lumbar Spine      Electrical Stimulation   Electrical Stimulation Location  LB    Electrical Stimulation Action  IFC    Electrical Stimulation Parameters  supine    Electrical Stimulation Goals  Pain               PT Short Term Goals - 01/28/17 1111      PT SHORT TERM GOAL #1   Title  independent with initial HEP    Baseline  after review    Status  Achieved        PT Long Term Goals - 02/25/17 1058      PT LONG TERM GOAL #1   Title  understand proper posture and body mechanics    Status  Partially Met      PT LONG TERM GOAL #  2   Title  decrease pain 50% at night    Status  Achieved      PT LONG TERM GOAL #3   Title  increase lumbar ROM 25%    Status  Partially Met      PT LONG TERM GOAL #4   Title  report no difficulty with standing to shop or cook    Status  Partially Met            Plan - 03/01/17 1358    Clinical Impression Statement  increased tolerance to activity with less pain. progressing towards goals    PT Treatment/Interventions  ADLs/Self Care Home Management;Electrical Stimulation;Moist Heat;Balance training;Therapeutic exercise;Therapeutic activities;Patient/family education;Functional mobility training;Manual techniques    PT Next Visit Plan  progress core and hip strength       Patient will benefit from skilled therapeutic intervention in order to improve the following deficits and impairments:  Decreased activity tolerance, Decreased balance, Decreased mobility, Decreased strength, Postural dysfunction, Improper body mechanics, Impaired flexibility, Pain, Increased muscle spasms, Decreased range of motion, Difficulty walking  Visit Diagnosis: Acute right-sided low back pain with right-sided sciatica  Difficulty in walking, not elsewhere classified  Muscle spasm of back     Problem List Patient Active Problem List   Diagnosis Date Noted  . Left knee pain 04/28/2015  . Atherosclerosis of  native coronary artery of native heart without angina pectoris 11/22/2013  . Essential hypertension 11/22/2013  . Pure hypercholesterolemia 11/22/2013  . Inversion, nipple 09/05/2013  . Left ankle injury 02/14/2013  . Right foot pain 06/08/2011  . Shingles   . Elevated cholesterol   . Cancer ([place])   . Right knee pain 05/10/2011    Marlan Steward,[name] PTA 03/01/2017, 1:59 PM  [facility] [address] [place], [place], [address] Phone: [phone]   Fax:  [phone]  Name: [name] MRN: [id number] Date of Birth: Mar 22, 1926

## 2017-03-03 ENCOUNTER — Ambulatory Visit: Payer: Medicare Other | Admitting: Physical Therapy

## 2017-03-03 DIAGNOSIS — M5441 Lumbago with sciatica, right side: Secondary | ICD-10-CM

## 2017-03-03 DIAGNOSIS — M62830 Muscle spasm of back: Secondary | ICD-10-CM

## 2017-03-03 DIAGNOSIS — R262 Difficulty in walking, not elsewhere classified: Secondary | ICD-10-CM

## 2017-03-03 NOTE — Therapy (Signed)
[name], [place], [address] Phone: [phone]   Fax:  [phone]  Physical Therapy Treatment  Patient Details  Name: [name] MRN: [id number] Date of Birth: 10-21-1926 Referring Provider: Lynann Bologna   Encounter Date: 03/03/2017  PT End of Session - 03/03/17 1428    Visit Number  9    Date for PT Re-Evaluation  03/22/17    PT Start Time  1400    PT Stop Time  1500    PT Time Calculation (min)  60 min       Past Medical History:  Diagnosis Date  . CAD (coronary artery disease)   . DUB (dysfunctional uterine bleeding)   . Elevated cholesterol   . GERD (gastroesophageal reflux disease)   . Hypertension   . Plantar fasciitis   . Shingles     Past Surgical History:  Procedure Laterality Date  . APPENDECTOMY  2011  . CARDIAC STINT IMPLANT  2008  . CARPAL TUNNEL RELEASE    . EYE SURGERY    . INGUINAL HERNIA REPAIR    . OOPHORECTOMY  2001   LAVH,BSO  . ULNA NERVE RELEASE  2009  . VAGINAL HYSTERECTOMY  2001   LAVH, BSO    There were no vitals filed for this visit.  Subjective Assessment - 03/03/17 1358    Subjective  back 70% better, still osme bad days but better. RT leg /knee pain    Currently in Pain?  Yes    Pain Score  2     Pain Location  Back                      [facility] Adult PT Treatment/Exercise - 03/03/17 0001      Lumbar Exercises: Aerobic   Stationary Bike  Nustep L 5 7 min      Lumbar Exercises: Machines for Strengthening   Cybex Lumbar Extension  black tband 2 sets 10 trunk flex     Cybex Knee Extension  5# 2 sets 10    Cybex Knee Flexion  15# 2 sets 15    Leg Press  20# 2 sets 15    Other Lumbar Machine Exercise  seated row 15# 2 sets 10    Other Lumbar Machine Exercise  lat pull 15# 2 sets 10      Lumbar Exercises: Standing   Other Standing Lumbar Exercises  hip 3 way red tband 15 times each      Lumbar Exercises: Supine   Ab Set  15  reps    Clam  15 reps    Clam Limitations  add ball squeeze      Moist Heat Therapy   Number Minutes Moist Heat  15 Minutes    Moist Heat Location  Lumbar Spine      Electrical Stimulation   Electrical Stimulation Location  LB    Electrical Stimulation Action  IFC    Electrical Stimulation Parameters  supine    Electrical Stimulation Goals  Pain               PT Short Term Goals - 01/28/17 1111      PT SHORT TERM GOAL #1   Title  independent with initial HEP    Baseline  after review    Status  Achieved        PT Long Term Goals - 03/03/17 1428      PT LONG TERM GOAL #  1   Title  understand proper posture and body mechanics    Status  Partially Met      PT LONG TERM GOAL #2   Title  decrease pain 50% at night    Status  Achieved      PT LONG TERM GOAL #3   Title  increase lumbar ROM 25%    Status  Achieved      PT LONG TERM GOAL #4   Title  report no difficulty with standing to shop or cook    Status  Achieved            Plan - 03/03/17 1429    Clinical Impression Statement  progressing with goals. pain much better and reports doin gmore in home without issues. some cuing needed for posture and BM    PT Treatment/Interventions  ADLs/Self Care Home Management;Electrical Stimulation;Moist Heat;Balance training;Therapeutic exercise;Therapeutic activities;Patient/family education;Functional mobility training;Manual techniques    PT Next Visit Plan  progress core and hip strength       Patient will benefit from skilled therapeutic intervention in order to improve the following deficits and impairments:  Decreased activity tolerance, Decreased balance, Decreased mobility, Decreased strength, Postural dysfunction, Improper body mechanics, Impaired flexibility, Pain, Increased muscle spasms, Decreased range of motion, Difficulty walking  Visit Diagnosis: Acute right-sided low back pain with right-sided sciatica  Difficulty in walking, not elsewhere  classified  Muscle spasm of back     Problem List Patient Active Problem List   Diagnosis Date Noted  . Left knee pain 04/28/2015  . Atherosclerosis of native coronary artery of native heart without angina pectoris 11/22/2013  . Essential hypertension 11/22/2013  . Pure hypercholesterolemia 11/22/2013  . Inversion, nipple 09/05/2013  . Left ankle injury 02/14/2013  . Right foot pain 06/08/2011  . Shingles   . Elevated cholesterol   . Cancer ([place])   . Right knee pain 05/10/2011    PAYSEUR,[name] PTA 03/03/2017, 2:30 PM  [place] [address] [place], [place], [address] Phone: [phone]   Fax:  [phone]  Name: [name] MRN: [id number] Date of Birth: [birth year]

## 2017-03-10 ENCOUNTER — Ambulatory Visit: Payer: Medicare Other | Admitting: Physical Therapy

## 2017-03-10 DIAGNOSIS — R262 Difficulty in walking, not elsewhere classified: Secondary | ICD-10-CM | POA: Diagnosis not present

## 2017-03-10 DIAGNOSIS — M5441 Lumbago with sciatica, right side: Secondary | ICD-10-CM

## 2017-03-10 DIAGNOSIS — R82998 Other abnormal findings in urine: Secondary | ICD-10-CM | POA: Diagnosis not present

## 2017-03-10 DIAGNOSIS — M62830 Muscle spasm of back: Secondary | ICD-10-CM | POA: Diagnosis not present

## 2017-03-10 DIAGNOSIS — M859 Disorder of bone density and structure, unspecified: Secondary | ICD-10-CM | POA: Diagnosis not present

## 2017-03-10 DIAGNOSIS — E7849 Other hyperlipidemia: Secondary | ICD-10-CM | POA: Diagnosis not present

## 2017-03-10 DIAGNOSIS — I10 Essential (primary) hypertension: Secondary | ICD-10-CM | POA: Diagnosis not present

## 2017-03-10 NOTE — Therapy (Signed)
[place], [place], [address] Phone: [phone]   Fax:  [phone]  Physical Therapy Treatment  Patient Details  Name: [name] MRN: [id number] Date of Birth: [birth year] Referring Provider: Lynann Bologna   Encounter Date: 03/10/2017  PT End of Session - 03/10/17 1551    Visit Number  10    Date for PT Re-Evaluation  03/22/17    PT Start Time  3532    PT Stop Time  1625    PT Time Calculation (min)  55 min       Past Medical History:  Diagnosis Date  . CAD (coronary artery disease)   . DUB (dysfunctional uterine bleeding)   . Elevated cholesterol   . GERD (gastroesophageal reflux disease)   . Hypertension   . Plantar fasciitis   . Shingles     Past Surgical History:  Procedure Laterality Date  . APPENDECTOMY  2011  . CARDIAC STINT IMPLANT  2008  . CARPAL TUNNEL RELEASE    . EYE SURGERY    . INGUINAL HERNIA REPAIR    . OOPHORECTOMY  2001   LAVH,BSO  . ULNA NERVE RELEASE  2009  . VAGINAL HYSTERECTOMY  2001   LAVH, BSO    There were no vitals filed for this visit.  Subjective Assessment - 03/10/17 1528    Subjective  doing better and better, feels really good day after PT    Currently in Pain?  Yes    Pain Score  2     Pain Location  Back                      [facility] Adult PT Treatment/Exercise - 03/10/17 0001      Lumbar Exercises: Aerobic   Stationary Bike  Nustep L 5 8 min    UBE (Upper Arm Bike)  L 2 2 fwd/ 2 back      Lumbar Exercises: Machines for Strengthening   Cybex Lumbar Extension  black tband 2 sets 10 ab pull 2 set s10    Cybex Knee Extension  10# 2 sets 10    Cybex Knee Flexion  20# 2 sets 15    Leg Press  20# 2 sets 15    Other Lumbar Machine Exercise  seated row 15# 2 sets 10    Other Lumbar Machine Exercise  lat pull 20# 2 sets 10      Lumbar Exercises: Standing   Other Standing Lumbar Exercises  hip 3 way green tband 15 times each       Moist Heat Therapy   Number Minutes Moist Heat  15 Minutes    Moist Heat Location  Lumbar Spine      Electrical Stimulation   Electrical Stimulation Location  LB    Electrical Stimulation Action  IFC    Electrical Stimulation Parameters  supine    Electrical Stimulation Goals  Pain               PT Short Term Goals - 01/28/17 1111      PT SHORT TERM GOAL #1   Title  independent with initial HEP    Baseline  after review    Status  Achieved        PT Long Term Goals - 03/03/17 1428      PT LONG TERM GOAL #1   Title  understand proper posture and body mechanics    Status  Partially Met  PT LONG TERM GOAL #2   Title  decrease pain 50% at night    Status  Achieved      PT LONG TERM GOAL #3   Title  increase lumbar ROM 25%    Status  Achieved      PT LONG TERM GOAL #4   Title  report no difficulty with standing to shop or cook    Status  Achieved            Plan - 03/10/17 1551    Clinical Impression Statement  pt tolerated increased time and wt with ex today, min postural cuing needed    PT Treatment/Interventions  ADLs/Self Care Home Management;Electrical Stimulation;Moist Heat;Balance training;Therapeutic exercise;Therapeutic activities;Patient/family education;Functional mobility training;Manual techniques    PT Next Visit Plan  progress core and hip strength       Patient will benefit from skilled therapeutic intervention in order to improve the following deficits and impairments:  Decreased activity tolerance, Decreased balance, Decreased mobility, Decreased strength, Postural dysfunction, Improper body mechanics, Impaired flexibility, Pain, Increased muscle spasms, Decreased range of motion, Difficulty walking  Visit Diagnosis: Acute right-sided low back pain with right-sided sciatica  Difficulty in walking, not elsewhere classified  Muscle spasm of back     Problem List Patient Active Problem List   Diagnosis Date Noted  . Left  knee pain 04/28/2015  . Atherosclerosis of native coronary artery of native heart without angina pectoris 11/22/2013  . Essential hypertension 11/22/2013  . Pure hypercholesterolemia 11/22/2013  . Inversion, nipple 09/05/2013  . Left ankle injury 02/14/2013  . Right foot pain 06/08/2011  . Shingles   . Elevated cholesterol   . Cancer ([place])   . Right knee pain 05/10/2011    Derrik Mceachern,[name] PTA 03/10/2017, 3:55 PM  [place] [address] [address] [place], [place], [address] Phone: [phone]   Fax:  [phone]  Name: Keianna Signer MRN: [id number] Date of Birth: [birth year]

## 2017-03-17 DIAGNOSIS — K5909 Other constipation: Secondary | ICD-10-CM | POA: Diagnosis not present

## 2017-03-17 DIAGNOSIS — N183 Chronic kidney disease, stage 3 (moderate): Secondary | ICD-10-CM | POA: Diagnosis not present

## 2017-03-17 DIAGNOSIS — I10 Essential (primary) hypertension: Secondary | ICD-10-CM | POA: Diagnosis not present

## 2017-03-17 DIAGNOSIS — Z6824 Body mass index (BMI) 24.0-24.9, adult: Secondary | ICD-10-CM | POA: Diagnosis not present

## 2017-03-17 DIAGNOSIS — M5416 Radiculopathy, lumbar region: Secondary | ICD-10-CM | POA: Diagnosis not present

## 2017-03-17 DIAGNOSIS — M8580 Other specified disorders of bone density and structure, unspecified site: Secondary | ICD-10-CM | POA: Diagnosis not present

## 2017-03-17 DIAGNOSIS — Z23 Encounter for immunization: Secondary | ICD-10-CM | POA: Diagnosis not present

## 2017-03-17 DIAGNOSIS — E7849 Other hyperlipidemia: Secondary | ICD-10-CM | POA: Diagnosis not present

## 2017-03-17 DIAGNOSIS — R740 Nonspecific elevation of levels of transaminase and lactic acid dehydrogenase [LDH]: Secondary | ICD-10-CM | POA: Diagnosis not present

## 2017-03-17 DIAGNOSIS — Z1389 Encounter for screening for other disorder: Secondary | ICD-10-CM | POA: Diagnosis not present

## 2017-03-17 DIAGNOSIS — Z0000 Encounter for general adult medical examination without abnormal findings: Secondary | ICD-10-CM | POA: Diagnosis not present

## 2017-03-17 DIAGNOSIS — Z9861 Coronary angioplasty status: Secondary | ICD-10-CM | POA: Diagnosis not present

## 2017-03-18 DIAGNOSIS — M47816 Spondylosis without myelopathy or radiculopathy, lumbar region: Secondary | ICD-10-CM | POA: Diagnosis not present

## 2017-03-18 DIAGNOSIS — M545 Low back pain: Secondary | ICD-10-CM | POA: Diagnosis not present

## 2017-03-18 DIAGNOSIS — M5416 Radiculopathy, lumbar region: Secondary | ICD-10-CM | POA: Diagnosis not present

## 2017-03-22 ENCOUNTER — Ambulatory Visit: Payer: Medicare Other | Attending: Orthopedic Surgery | Admitting: Physical Therapy

## 2017-03-22 DIAGNOSIS — M62830 Muscle spasm of back: Secondary | ICD-10-CM | POA: Insufficient documentation

## 2017-03-22 DIAGNOSIS — R262 Difficulty in walking, not elsewhere classified: Secondary | ICD-10-CM | POA: Insufficient documentation

## 2017-03-22 DIAGNOSIS — M5441 Lumbago with sciatica, right side: Secondary | ICD-10-CM | POA: Diagnosis not present

## 2017-03-22 NOTE — Therapy (Signed)
[place], [place], [address] Phone: [phone]   Fax:  [phone]  Physical Therapy Treatment  Patient Details  Name: [name] MRN: [id number] Date of Birth: 08/24/1926 Referring Provider: Lynann Bologna   Encounter Date: 03/22/2017  PT End of Session - 03/22/17 1310    Visit Number  11    Date for PT Re-Evaluation  03/22/17    PT Start Time  1310    PT Stop Time  1408    PT Time Calculation (min)  58 min       Past Medical History:  Diagnosis Date  . CAD (coronary artery disease)   . DUB (dysfunctional uterine bleeding)   . Elevated cholesterol   . GERD (gastroesophageal reflux disease)   . Hypertension   . Plantar fasciitis   . Shingles     Past Surgical History:  Procedure Laterality Date  . APPENDECTOMY  2011  . CARDIAC STINT IMPLANT  2008  . CARPAL TUNNEL RELEASE    . EYE SURGERY    . INGUINAL HERNIA REPAIR    . OOPHORECTOMY  2001   LAVH,BSO  . ULNA NERVE RELEASE  2009  . VAGINAL HYSTERECTOMY  2001   LAVH, BSO    There were no vitals filed for this visit.  Subjective Assessment - 03/22/17 1304    Subjective  80-90% better. doing so much more than i was without pain    Currently in Pain?  No/denies         [facility] PT Assessment - 03/22/17 0001      AROM   Overall AROM Comments  WFLS ext decreased 25%      Strength   Overall Strength Comments  4/5                  [facility] Adult PT Treatment/Exercise - 03/22/17 0001      Lumbar Exercises: Aerobic   Stationary Bike  Nustep L 5 8 min    UBE (Upper Arm Bike)  L 2 2 fwd/ 2 back      Lumbar Exercises: Machines for Strengthening   Cybex Lumbar Extension  black tband 2 sets 10 ab pull 2 set s10    Cybex Knee Extension  10# 2 sets 10    Cybex Knee Flexion  20# 2 sets 15    Leg Press  20# 2 sets 15    Other Lumbar Machine Exercise  seated row 15# 2 sets 10    Other Lumbar Machine Exercise  lat pull 20# 2  sets 10      Lumbar Exercises: Standing   Other Standing Lumbar Exercises  hip 3 way green tband 15 times each      Lumbar Exercises: Supine   Ab Set  15 reps      Moist Heat Therapy   Number Minutes Moist Heat  15 Minutes    Moist Heat Location  Lumbar Spine      Electrical Stimulation   Electrical Stimulation Location  LB    Electrical Stimulation Action  IFC    Electrical Stimulation Parameters  supine    Electrical Stimulation Goals  Pain               PT Short Term Goals - 01/28/17 1111      PT SHORT TERM GOAL #1   Title  independent with initial HEP    Baseline  after review    Status  Achieved  PT Long Term Goals - 03/22/17 1311      PT LONG TERM GOAL #1   Title  understand proper posture and body mechanics    Status  Achieved      PT LONG TERM GOAL #2   Title  decrease pain 50% at night    Status  Achieved      PT LONG TERM GOAL #3   Title  increase lumbar ROM 25%    Status  Achieved      PT LONG TERM GOAL #4   Title  report no difficulty with standing to shop or cook    Status  Achieved            Plan - 03/22/17 1314    Clinical Impression Statement  all goals met. pt pleased with porgress. D/C    PT Treatment/Interventions  ADLs/Self Care Home Management;Electrical Stimulation;Moist Heat;Balance training;Therapeutic exercise;Therapeutic activities;Patient/family education;Functional mobility training;Manual techniques    PT Next Visit Plan  D/C       Patient will benefit from skilled therapeutic intervention in order to improve the following deficits and impairments:  Decreased activity tolerance, Decreased balance, Decreased mobility, Decreased strength, Postural dysfunction, Improper body mechanics, Impaired flexibility, Pain, Increased muscle spasms, Decreased range of motion, Difficulty walking  Visit Diagnosis: Acute right-sided low back pain with right-sided sciatica  Difficulty in walking, not elsewhere  classified  Muscle spasm of back     Problem List Patient Active Problem List   Diagnosis Date Noted  . Left knee pain 04/28/2015  . Atherosclerosis of native coronary artery of native heart without angina pectoris 11/22/2013  . Essential hypertension 11/22/2013  . Pure hypercholesterolemia 11/22/2013  . Inversion, nipple 09/05/2013  . Left ankle injury 02/14/2013  . Right foot pain 06/08/2011  . Shingles   . Elevated cholesterol   . Cancer ([place])   . Right knee pain 05/10/2011   PHYSICAL THERAPY DISCHARGE SUMMARY   Plan: Patient agrees to discharge.  Patient goals were met. Patient is being discharged due to meeting the stated rehab goals.  ?????      PAYSEUR,[name] PTA 03/22/2017, 1:32 PM  [place], [place], [address] Phone: [phone]   Fax:  [phone]  Name: Classie Weng MRN: [id number] Date of Birth: Dec 09, 1926

## 2017-04-13 DIAGNOSIS — I10 Essential (primary) hypertension: Secondary | ICD-10-CM | POA: Diagnosis not present

## 2017-06-01 DIAGNOSIS — M1712 Unilateral primary osteoarthritis, left knee: Secondary | ICD-10-CM | POA: Diagnosis not present

## 2017-06-01 DIAGNOSIS — S300XXA Contusion of lower back and pelvis, initial encounter: Secondary | ICD-10-CM | POA: Diagnosis not present

## 2017-09-14 ENCOUNTER — Ambulatory Visit (INDEPENDENT_AMBULATORY_CARE_PROVIDER_SITE_OTHER): Payer: Medicare Other | Admitting: Women's Health

## 2017-09-14 ENCOUNTER — Encounter: Payer: Self-pay | Admitting: Women's Health

## 2017-09-14 VITALS — BP 120/78 | Ht 64.0 in | Wt 142.2 lb

## 2017-09-14 DIAGNOSIS — Z01419 Encounter for gynecological examination (general) (routine) without abnormal findings: Secondary | ICD-10-CM

## 2017-09-14 DIAGNOSIS — Z9189 Other specified personal risk factors, not elsewhere classified: Secondary | ICD-10-CM | POA: Diagnosis not present

## 2017-09-14 DIAGNOSIS — Z8542 Personal history of malignant neoplasm of other parts of uterus: Secondary | ICD-10-CM

## 2017-09-14 NOTE — Patient Instructions (Signed)
Health Maintenance for Postmenopausal Women Menopause is a normal process in which your reproductive ability comes to an end. This process happens gradually over a span of months to years, usually between the ages of 22 and 9. Menopause is complete when you have missed 12 consecutive menstrual periods. It is important to talk with your health care provider about some of the most common conditions that affect postmenopausal women, such as heart disease, cancer, and bone loss (osteoporosis). Adopting a healthy lifestyle and getting preventive care can help to promote your health and wellness. Those actions can also lower your chances of developing some of these common conditions. What should I know about menopause? During menopause, you may experience a number of symptoms, such as:  Moderate-to-severe hot flashes.  Night sweats.  Decrease in sex drive.  Mood swings.  Headaches.  Tiredness.  Irritability.  Memory problems.  Insomnia.  Choosing to treat or not to treat menopausal changes is an individual decision that you make with your health care provider. What should I know about hormone replacement therapy and supplements? Hormone therapy products are effective for treating symptoms that are associated with menopause, such as hot flashes and night sweats. Hormone replacement carries certain risks, especially as you become older. If you are thinking about using estrogen or estrogen with progestin treatments, discuss the benefits and risks with your health care provider. What should I know about heart disease and stroke? Heart disease, heart attack, and stroke become more likely as you age. This may be due, in part, to the hormonal changes that your body experiences during menopause. These can affect how your body processes dietary fats, triglycerides, and cholesterol. Heart attack and stroke are both medical emergencies. There are many things that you can do to help prevent heart disease  and stroke:  Have your blood pressure checked at least every 1-2 years. High blood pressure causes heart disease and increases the risk of stroke.  If you are 53-22 years old, ask your health care provider if you should take aspirin to prevent a heart attack or a stroke.  Do not use any tobacco products, including cigarettes, chewing tobacco, or electronic cigarettes. If you need help quitting, ask your health care provider.  It is important to eat a healthy diet and maintain a healthy weight. ? Be sure to include plenty of vegetables, fruits, low-fat dairy products, and lean protein. ? Avoid eating foods that are high in solid fats, added sugars, or salt (sodium).  Get regular exercise. This is one of the most important things that you can do for your health. ? Try to exercise for at least 150 minutes each week. The type of exercise that you do should increase your heart rate and make you sweat. This is known as moderate-intensity exercise. ? Try to do strengthening exercises at least twice each week. Do these in addition to the moderate-intensity exercise.  Know your numbers.Ask your health care provider to check your cholesterol and your blood glucose. Continue to have your blood tested as directed by your health care provider.  What should I know about cancer screening? There are several types of cancer. Take the following steps to reduce your risk and to catch any cancer development as early as possible. Breast Cancer  Practice breast self-awareness. ? This means understanding how your breasts normally appear and feel. ? It also means doing regular breast self-exams. Let your health care provider know about any changes, no matter how small.  If you are 40  or older, have a clinician do a breast exam (clinical breast exam or CBE) every year. Depending on your age, family history, and medical history, it may be recommended that you also have a yearly breast X-ray (mammogram).  If you  have a family history of breast cancer, talk with your health care provider about genetic screening.  If you are at high risk for breast cancer, talk with your health care provider about having an MRI and a mammogram every year.  Breast cancer (BRCA) gene test is recommended for women who have family members with BRCA-related cancers. Results of the assessment will determine the need for genetic counseling and BRCA1 and for BRCA2 testing. BRCA-related cancers include these types: ? Breast. This occurs in males or females. ? Ovarian. ? Tubal. This may also be called fallopian tube cancer. ? Cancer of the abdominal or pelvic lining (peritoneal cancer). ? Prostate. ? Pancreatic.  Cervical, Uterine, and Ovarian Cancer Your health care provider may recommend that you be screened regularly for cancer of the pelvic organs. These include your ovaries, uterus, and vagina. This screening involves a pelvic exam, which includes checking for microscopic changes to the surface of your cervix (Pap test).  For women ages 21-65, health care providers may recommend a pelvic exam and a Pap test every three years. For women ages 79-65, they may recommend the Pap test and pelvic exam, combined with testing for human papilloma virus (HPV), every five years. Some types of HPV increase your risk of cervical cancer. Testing for HPV may also be done on women of any age who have unclear Pap test results.  Other health care providers may not recommend any screening for nonpregnant women who are considered low risk for pelvic cancer and have no symptoms. Ask your health care provider if a screening pelvic exam is right for you.  If you have had past treatment for cervical cancer or a condition that could lead to cancer, you need Pap tests and screening for cancer for at least 20 years after your treatment. If Pap tests have been discontinued for you, your risk factors (such as having a new sexual partner) need to be  reassessed to determine if you should start having screenings again. Some women have medical problems that increase the chance of getting cervical cancer. In these cases, your health care provider may recommend that you have screening and Pap tests more often.  If you have a family history of uterine cancer or ovarian cancer, talk with your health care provider about genetic screening.  If you have vaginal bleeding after reaching menopause, tell your health care provider.  There are currently no reliable tests available to screen for ovarian cancer.  Lung Cancer Lung cancer screening is recommended for adults 69-62 years old who are at high risk for lung cancer because of a history of smoking. A yearly low-dose CT scan of the lungs is recommended if you:  Currently smoke.  Have a history of at least 30 pack-years of smoking and you currently smoke or have quit within the past 15 years. A pack-year is smoking an average of one pack of cigarettes per day for one year.  Yearly screening should:  Continue until it has been 15 years since you quit.  Stop if you develop a health problem that would prevent you from having lung cancer treatment.  Colorectal Cancer  This type of cancer can be detected and can often be prevented.  Routine colorectal cancer screening usually begins at  age 42 and continues through age 45.  If you have risk factors for colon cancer, your health care provider may recommend that you be screened at an earlier age.  If you have a family history of colorectal cancer, talk with your health care provider about genetic screening.  Your health care provider may also recommend using home test kits to check for hidden blood in your stool.  A small camera at the end of a tube can be used to examine your colon directly (sigmoidoscopy or colonoscopy). This is done to check for the earliest forms of colorectal cancer.  Direct examination of the colon should be repeated every  5-10 years until age 71. However, if early forms of precancerous polyps or small growths are found or if you have a family history or genetic risk for colorectal cancer, you may need to be screened more often.  Skin Cancer  Check your skin from head to toe regularly.  Monitor any moles. Be sure to tell your health care provider: ? About any new moles or changes in moles, especially if there is a change in a mole's shape or color. ? If you have a mole that is larger than the size of a pencil eraser.  If any of your family members has a history of skin cancer, especially at a Charla Criscione age, talk with your health care provider about genetic screening.  Always use sunscreen. Apply sunscreen liberally and repeatedly throughout the day.  Whenever you are outside, protect yourself by wearing long sleeves, pants, a wide-brimmed hat, and sunglasses.  What should I know about osteoporosis? Osteoporosis is a condition in which bone destruction happens more quickly than new bone creation. After menopause, you may be at an increased risk for osteoporosis. To help prevent osteoporosis or the bone fractures that can happen because of osteoporosis, the following is recommended:  If you are 46-71 years old, get at least 1,000 mg of calcium and at least 600 mg of vitamin D per day.  If you are older than age 55 but younger than age 65, get at least 1,200 mg of calcium and at least 600 mg of vitamin D per day.  If you are older than age 54, get at least 1,200 mg of calcium and at least 800 mg of vitamin D per day.  Smoking and excessive alcohol intake increase the risk of osteoporosis. Eat foods that are rich in calcium and vitamin D, and do weight-bearing exercises several times each week as directed by your health care provider. What should I know about how menopause affects my mental health? Depression may occur at any age, but it is more common as you become older. Common symptoms of depression  include:  Low or sad mood.  Changes in sleep patterns.  Changes in appetite or eating patterns.  Feeling an overall lack of motivation or enjoyment of activities that you previously enjoyed.  Frequent crying spells.  Talk with your health care provider if you think that you are experiencing depression. What should I know about immunizations? It is important that you get and maintain your immunizations. These include:  Tetanus, diphtheria, and pertussis (Tdap) booster vaccine.  Influenza every year before the flu season begins.  Pneumonia vaccine.  Shingles vaccine.  Your health care provider may also recommend other immunizations. This information is not intended to replace advice given to you by your health care provider. Make sure you discuss any questions you have with your health care provider. Document Released: 04/23/2005  Document Revised: 09/19/2015 Document Reviewed: 12/03/2014 Elsevier Interactive Patient Education  2018 [employer]vier Inc[employer]  

## 2017-09-14 NOTE — Progress Notes (Signed)
[name] 11/18/1926 [id number]    History:    Presents for breast and pelvic exam.  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  Normal mammogram history.  Hypertension and CAD primary care manages.  Current on vaccines.  History of a negative colonoscopy at 84.  Lives independently with husband, able to drive, knits, plays cards and  does group activities.  Past medical history, past surgical history, family history and social history were all reviewed and documented in the EPIC chart.  Grandson recently died of endocarditis at age 82 suddenly.  Husband poor health, smoker.  Good family support.  ROS:  A ROS was performed and pertinent positives and negatives are included.  Exam:  Vitals:   09/14/17 1107  BP: 120/78  Weight: 142 lb 3.2 oz (64.5 kg)  Height: 5\' 4"  (1.626 m)   Body mass index is 24.41 kg/m.   General appearance:  Normal Thyroid:  Symmetrical, normal in size, without palpable masses or nodularity. Respiratory  Auscultation:  Clear without wheezing or rhonchi Cardiovascular  Auscultation:  Regular rate, without rubs, murmurs or gallops  Edema/varicosities:  Not grossly evident Abdominal  Soft,nontender, without masses, guarding or rebound.  Liver/spleen:  No organomegaly noted  Hernia:  None appreciated  Skin  Inspection:  Grossly normal   Breasts: Examined lying and sitting.     Right: Without masses, retractions, discharge or axillary adenopathy.     Left: Without masses, retractions, discharge or axillary adenopathy. Gentitourinary   Inguinal/mons:  Normal without inguinal adenopathy  External genitalia:  Normal  BUS/Urethra/Skene's glands:  Normal  Vagina:  Normal  Cervix: And uterus absent   Adnexa/parametria:     Rt: Without masses or tenderness.   Lt: Without masses or tenderness.  Anus and perineum: Normal  Digital rectal exam: Normal sphincter tone without palpated masses or  tenderness  Assessment/Plan:  84 y.o. MWF G2, P2 for breast and pelvic exam for Pap.  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  SBE's, continue annual annual screening mammograms, calcium rich foods, vitamin D 2000 daily encouraged.  Home safety, fall prevention and importance of weightbearing and balance type exercise reviewed and encouraged.    [name] [facility], 11:43 AM 09/14/2017

## 2017-09-16 ENCOUNTER — Other Ambulatory Visit: Payer: Self-pay | Admitting: Internal Medicine

## 2017-09-16 DIAGNOSIS — Z1231 Encounter for screening mammogram for malignant neoplasm of breast: Secondary | ICD-10-CM

## 2017-09-16 LAB — PAP IG W/ RFLX HPV ASCU

## 2017-10-19 DIAGNOSIS — L820 Inflamed seborrheic keratosis: Secondary | ICD-10-CM | POA: Diagnosis not present

## 2017-10-24 ENCOUNTER — Ambulatory Visit
Admission: RE | Admit: 2017-10-24 | Discharge: 2017-10-24 | Disposition: A | Discharge status: Home / Self Care | Payer: Medicare Other | Source: Ambulatory Visit | Attending: Internal Medicine | Admitting: Internal Medicine

## 2017-10-24 DIAGNOSIS — Z1231 Encounter for screening mammogram for malignant neoplasm of breast: Secondary | ICD-10-CM

## 2017-11-04 DIAGNOSIS — M170 Bilateral primary osteoarthritis of knee: Secondary | ICD-10-CM | POA: Diagnosis not present

## 2017-11-08 DIAGNOSIS — H5213 Myopia, bilateral: Secondary | ICD-10-CM | POA: Diagnosis not present

## 2017-11-08 DIAGNOSIS — H35373 Puckering of macula, bilateral: Secondary | ICD-10-CM | POA: Diagnosis not present

## 2017-11-08 DIAGNOSIS — H43811 Vitreous degeneration, right eye: Secondary | ICD-10-CM | POA: Diagnosis not present

## 2017-11-08 DIAGNOSIS — H31002 Unspecified chorioretinal scars, left eye: Secondary | ICD-10-CM | POA: Diagnosis not present

## 2017-11-22 DIAGNOSIS — R5383 Other fatigue: Secondary | ICD-10-CM | POA: Diagnosis not present

## 2017-12-07 DIAGNOSIS — Z23 Encounter for immunization: Secondary | ICD-10-CM | POA: Diagnosis not present

## 2018-01-11 DIAGNOSIS — H35373 Puckering of macula, bilateral: Secondary | ICD-10-CM | POA: Diagnosis not present

## 2018-01-11 DIAGNOSIS — Z961 Presence of intraocular lens: Secondary | ICD-10-CM | POA: Diagnosis not present

## 2018-02-13 ENCOUNTER — Ambulatory Visit (INDEPENDENT_AMBULATORY_CARE_PROVIDER_SITE_OTHER): Payer: Medicare Other | Admitting: Cardiology

## 2018-02-13 ENCOUNTER — Encounter (INDEPENDENT_AMBULATORY_CARE_PROVIDER_SITE_OTHER): Payer: Self-pay

## 2018-02-13 ENCOUNTER — Encounter: Payer: Self-pay | Admitting: Cardiology

## 2018-02-13 VITALS — BP 136/70 | HR 58 | Ht 64.0 in | Wt 144.0 lb

## 2018-02-13 DIAGNOSIS — I2510 Atherosclerotic heart disease of native coronary artery without angina pectoris: Secondary | ICD-10-CM | POA: Diagnosis not present

## 2018-02-13 DIAGNOSIS — I10 Essential (primary) hypertension: Secondary | ICD-10-CM | POA: Diagnosis not present

## 2018-02-13 DIAGNOSIS — K219 Gastro-esophageal reflux disease without esophagitis: Secondary | ICD-10-CM

## 2018-02-13 NOTE — Patient Instructions (Signed)
Medication Instructions:  The current medical regimen is effective;  continue present plan and medications.  If you need a refill on your cardiac medications before your next appointment, please call your pharmacy.   Follow-Up: At [facility], you and your health needs are our priority.  As part of our continuing mission to provide you with exceptional heart care, we have created designated Provider Care Teams.  These Care Teams include your primary Cardiologist (physician) and Advanced Practice Providers (APPs -  Physician Assistants and Nurse Practitioners) who all work together to provide you with the care you need, when you need it. You will need a follow up appointment in 12 months.  Please call our office 2 months in advance to schedule this appointment.  You may see Mark Skains, MD or one of the following Advanced Practice Providers on your designated Care Team:   Lori Gerhardt, NP Laura Ingold, NP . Jill McDaniel, NP  Thank you for choosing [facility]!!      

## 2018-02-13 NOTE — Progress Notes (Signed)
Cardiology Office Note:    Date:  02/13/2018   ID:  [name], DOB 1926/05/16, MRN [id number]  PCP:  Marton Redwood, MD  Cardiologist:  Candee Furbish, MD  Electrophysiologist:  None   Referring MD: Marton Redwood, MD     History of Present Illness:    [name] is a 84 y.o. female here for follow-up of coronary artery disease with prior DES to proximal RCA in August 2008.  Follow-up stress test 2009 showed no ischemia.  Normal EF.  On atorvastatin  Occasionally will have throat discomfort with heavy exertional activity but this is where.  Past Medical History:  Diagnosis Date  . CAD (coronary artery disease)   . DUB (dysfunctional uterine bleeding)   . Elevated cholesterol   . GERD (gastroesophageal reflux disease)   . Hypertension   . Plantar fasciitis   . Shingles     Past Surgical History:  Procedure Laterality Date  . APPENDECTOMY  2011  . CARDIAC STINT IMPLANT  2008  . CARPAL TUNNEL RELEASE    . EYE SURGERY    . INGUINAL HERNIA REPAIR    . OOPHORECTOMY  2001   LAVH,BSO  . ULNA NERVE RELEASE  2009  . VAGINAL HYSTERECTOMY  2001   LAVH, BSO    Current Medications: Current Meds  Medication Sig  . Ascorbic Acid (VITAMIN C) 1000 MG tablet Take 1,000 mg by mouth daily.  Marland Kitchen aspirin 81 MG tablet Take 81 mg by mouth daily.    Marland Kitchen atorvastatin (LIPITOR) 40 MG tablet   . Calcium-Vitamin D (CALTRATE 600 PLUS-VIT D PO) Take 1 tablet by mouth 2 (two) times daily.  . cetirizine (ZYRTEC) 10 MG tablet Take 10 mg by mouth daily.    . Cholecalciferol (VITAMIN D) 2000 units CAPS Take 2,000 Units by mouth daily.  . clorazepate (TRANXENE) 7.5 MG tablet Take 7.5 mg by mouth once as needed. For nerves  . Coenzyme Q10 (CO Q 10 PO) Take 1 tablet by mouth every morning.  Marland Kitchen glucosamine-chondroitin 500-400 MG tablet Take 1 tablet by mouth 2 (two) times daily.   Marland Kitchen lisinopril (PRINIVIL,ZESTRIL) 40 MG tablet Take 40 mg by mouth daily.  . nitroGLYCERIN (NITROSTAT) 0.4 MG SL tablet  Place 1 tablet (0.4 mg total) under the tongue every 5 (five) minutes as needed for chest pain.  . pantoprazole (PROTONIX) 40 MG tablet      Allergies:   Sulfa antibiotics   Social History   Socioeconomic History  . Marital status: Married    Spouse name: Not on file  . Number of children: Not on file  . Years of education: Not on file  . Highest education level: Not on file  Occupational History  . Not on file  Social Needs  . Financial resource strain: Not on file  . Food insecurity:    Worry: Not on file    Inability: Not on file  . Transportation needs:    Medical: Not on file    Non-medical: Not on file  Tobacco Use  . Smoking status: Former Research scientist (life sciences)  . Smokeless tobacco: Never Used  Substance and Sexual Activity  . Alcohol use: No    Alcohol/week: 0.0 standard drinks  . Drug use: No  . Sexual activity: Never    Birth control/protection: Post-menopausal, Surgical    Comment: INTERCOURSE AGE 42, SEXUAL PARTNERS LEWSS THAN 7  Lifestyle  . Physical activity:    Days per week: Not on file    Minutes per session:  Not on file  . Stress: Not on file  Relationships  . Social connections:    Talks on phone: Not on file    Gets together: Not on file    Attends religious service: Not on file    Active member of club or organization: Not on file    Attends meetings of clubs or organizations: Not on file    Relationship status: Not on file  Other Topics Concern  . Not on file  Social History Narrative  . Not on file     Family History: The patient's family history includes Asthma in her brother; CAD in her father; COPD in her brother; Diabetes in her brother; Heart attack in her father and mother; Heart disease in her brother; Hypertension in her brother and mother. There is no history of Breast cancer.  ROS:   Please see the history of present illness.    Negative for fevers chills nausea vomiting syncope all other systems reviewed and are negative.  EKGs/Labs/Other  Studies Reviewed:    The following studies were reviewed today: Prior office notes, EKG, lab work  EKG: 02/13/2018-sinus bradycardia 58 poor R wave progression personally reviewed and interpreted  Recent Labs: No results found for requested labs within last 8760 hours.  Recent Lipid Panel No results found for: CHOL, TRIG, HDL, CHOLHDL, VLDL, LDLCALC, LDLDIRECT  Physical Exam:    VS:  BP 136/70   Pulse (!) 58   Ht 5\' 4"  (1.626 m)   Wt 144 lb (65.3 kg)   LMP  (LMP Unknown)   BMI 24.72 kg/m     Wt Readings from Last 3 Encounters:  02/13/18 144 lb (65.3 kg)  09/14/17 142 lb 3.2 oz (64.5 kg)  02/01/17 145 lb 12.8 oz (66.1 kg)     GEN:  Well nourished, well developed in no acute distress HEENT: Normal NECK: No JVD; No carotid bruits LYMPHATICS: No lymphadenopathy CARDIAC: RRR, no murmurs, rubs, gallops RESPIRATORY:  Clear to auscultation without rales, wheezing or rhonchi  ABDOMEN: Soft, non-tender, non-distended MUSCULOSKELETAL:  No edema; No deformity  SKIN: Warm and dry NEUROLOGIC:  Alert and oriented x 3 PSYCHIATRIC:  Normal affect   ASSESSMENT:    1. Atherosclerosis of native coronary artery of native heart without angina pectoris   2. Essential hypertension   3. Gastroesophageal reflux disease without esophagitis    PLAN:    In order of problems listed above:  Coronary artery disease -DES to RCA.  Subsequent stress test low risk.  Doing well.  Minor throat tightness.  Continue with secondary prevention.  Hyperlipidemia - Doing well.  No myalgias.  GERD  - stopped TEA. Helped.   Essential hypertension   - currently well controlled.    Medication Adjustments/Labs and Tests Ordered: Current medicines are reviewed at length with the patient today.  Concerns regarding medicines are outlined above.  Orders Placed This Encounter  Procedures  . EKG 12-Lead   No orders of the defined types were placed in this encounter.   Patient Instructions    Medication Instructions:  The current medical regimen is effective;  continue present plan and medications.  If you need a refill on your cardiac medications before your next appointment, please call your pharmacy.   Follow-Up: At [facility], you and your health needs are our priority.  As part of our continuing mission to provide you with exceptional heart care, we have created designated Provider Care Teams.  These Care Teams include your primary Cardiologist (physician) and  Advanced Practice Providers (APPs -  Physician Assistants and Nurse Practitioners) who all work together to provide you with the care you need, when you need it. You will need a follow up appointment in 12 months.  Please call our office 2 months in advance to schedule this appointment.  You may see Candee Furbish, MD or one of the following Advanced Practice Providers on your designated Care Team:   Truitt Merle, NP Cecilie Kicks, NP . Kathyrn Drown, NP  Thank you for choosing [facility]!!         Signed, Candee Furbish, MD  02/13/2018 3:49 PM    [place]

## 2018-03-13 DIAGNOSIS — R82998 Other abnormal findings in urine: Secondary | ICD-10-CM | POA: Diagnosis not present

## 2018-03-13 DIAGNOSIS — M859 Disorder of bone density and structure, unspecified: Secondary | ICD-10-CM | POA: Diagnosis not present

## 2018-03-13 DIAGNOSIS — E7849 Other hyperlipidemia: Secondary | ICD-10-CM | POA: Diagnosis not present

## 2018-03-13 DIAGNOSIS — N183 Chronic kidney disease, stage 3 (moderate): Secondary | ICD-10-CM | POA: Diagnosis not present

## 2018-03-20 DIAGNOSIS — I10 Essential (primary) hypertension: Secondary | ICD-10-CM | POA: Diagnosis not present

## 2018-03-20 DIAGNOSIS — N183 Chronic kidney disease, stage 3 (moderate): Secondary | ICD-10-CM | POA: Diagnosis not present

## 2018-03-20 DIAGNOSIS — E7849 Other hyperlipidemia: Secondary | ICD-10-CM | POA: Diagnosis not present

## 2018-03-20 DIAGNOSIS — Z6824 Body mass index (BMI) 24.0-24.9, adult: Secondary | ICD-10-CM | POA: Diagnosis not present

## 2018-03-20 DIAGNOSIS — K219 Gastro-esophageal reflux disease without esophagitis: Secondary | ICD-10-CM | POA: Diagnosis not present

## 2018-03-20 DIAGNOSIS — K5909 Other constipation: Secondary | ICD-10-CM | POA: Diagnosis not present

## 2018-03-20 DIAGNOSIS — Z0000 Encounter for general adult medical examination without abnormal findings: Secondary | ICD-10-CM | POA: Diagnosis not present

## 2018-03-20 DIAGNOSIS — M859 Disorder of bone density and structure, unspecified: Secondary | ICD-10-CM | POA: Diagnosis not present

## 2018-03-20 DIAGNOSIS — Z9861 Coronary angioplasty status: Secondary | ICD-10-CM | POA: Diagnosis not present

## 2018-03-20 DIAGNOSIS — Z1389 Encounter for screening for other disorder: Secondary | ICD-10-CM | POA: Diagnosis not present

## 2018-05-25 ENCOUNTER — Telehealth: Payer: Self-pay | Admitting: Cardiology

## 2018-05-25 NOTE — Telephone Encounter (Signed)
Spoke with patient who is reporting off and on for the past 2 weeks she has noticed some chest discomfort.  Sometimes goes days without it.   Nothing makes it worse or better.  No N/V, diaphoresis.  She states she has some SOB with walking but is able to still talk comfortably.  She feels as though she doesn't notice it when she is not home and acknowledges she lives in a "stressful situation."  She denies any other s/s.  She will continue to monitor and call back if s/s change or worsen.  She knows when to call 911 and report to ED for further evaluation if necessary.

## 2018-05-25 NOTE — Telephone Encounter (Signed)
New Message  Patient states that she has been having periodic episodes of chest discomfort. She says that is not pain but some discomfort that she is aware. She will be traveling out of town soon and want to be sure that she is okay. No complaints of sob, nausea or dizziness. She just wants to be checked out. I did schedule her for 05/30/18 to see Truitt Merle.

## 2018-05-30 ENCOUNTER — Encounter: Payer: Self-pay | Admitting: Nurse Practitioner

## 2018-05-30 ENCOUNTER — Ambulatory Visit (INDEPENDENT_AMBULATORY_CARE_PROVIDER_SITE_OTHER): Payer: Medicare Other | Admitting: Nurse Practitioner

## 2018-05-30 ENCOUNTER — Ambulatory Visit (HOSPITAL_COMMUNITY): Payer: Medicare Other | Attending: Cardiovascular Disease

## 2018-05-30 ENCOUNTER — Other Ambulatory Visit: Payer: Self-pay

## 2018-05-30 VITALS — BP 190/90 | HR 57 | Ht 64.0 in | Wt 140.0 lb

## 2018-05-30 DIAGNOSIS — R079 Chest pain, unspecified: Secondary | ICD-10-CM | POA: Diagnosis not present

## 2018-05-30 DIAGNOSIS — R011 Cardiac murmur, unspecified: Secondary | ICD-10-CM | POA: Diagnosis not present

## 2018-05-30 LAB — ECHOCARDIOGRAM COMPLETE
Height: 64 in
Weight: 2240 [oz_av]

## 2018-05-30 MED ORDER — AMLODIPINE BESYLATE 5 MG PO TABS: 5.0000 mg | ORAL_TABLET | Freq: Every day | ORAL | 11 refills | Status: DC

## 2018-05-30 MED ORDER — NITROGLYCERIN 0.4 MG SL SUBL: 0.4000 mg | SUBLINGUAL_TABLET | SUBLINGUAL | 3 refills | Status: AC | PRN

## 2018-05-30 NOTE — Progress Notes (Signed)
CARDIOLOGY OFFICE NOTE  Date:  05/30/2018    [name] Date of Birth: [birth year] Medical Record #[id number]  PCP:  Marton Redwood, MD  Cardiologist:  [facility]   Chief Complaint  Patient presents with  . Chest Pain  . Coronary Artery Disease    Work in visit - seen for Dr. Marlou Porch    History of Present Illness: [name] is a 85 y.o. female who presents today for a work in visit. Seen for Dr. Marlou Porch.   She has known CAD with prior DES to proximal RCA from August 2008.  Follow-up stress test 2009 showed no ischemia. Other issues include HLD.   She was last seen here in December by Dr. Marlou Porch - noted some occasional throat discomfort with heavy exertional activity.   Phone call last week - "Spoke with patient who is reporting off and on for the past 2 weeks she has noticed some chest discomfort.  Sometimes goes days without it.   Nothing makes it worse or better.  No N/V, diaphoresis.  She states she has some SOB with walking but is able to still talk comfortably.  She feels as though she doesn't notice it when she is not home and acknowledges she lives in a "stressful situation."  She denies any other s/s.  She will continue to monitor and call back if s/s change or worsen.  She knows when to call 911 and report to ED for further evaluation if necessary." Thus added to my schedule for today.   Patient screened for recent travel, fever, URI symptoms and shortness of breath. Patient denies travel over the last 14 days and is currently without symptoms.    Comes in today. Here alone. She notes that for the past few weeks she has had a heavy/burning pain - little bit in the back of her throat - this only occurs after she exerts herself. She walks 20 to 30 minutes every day - has no problem with this. She did not think to use any sl NTG but does have - it is old. She is not too short of breath. Not dizzy at all. BP is pretty high - she does not check at home. She was off her PPI  for some time - now back on. She does eat out most meals - went to the [employer] last night.   Past Medical History:  Diagnosis Date  . CAD (coronary artery disease)   . DUB (dysfunctional uterine bleeding)   . Elevated cholesterol   . GERD (gastroesophageal reflux disease)   . Hypertension   . Plantar fasciitis   . Shingles     Past Surgical History:  Procedure Laterality Date  . APPENDECTOMY  2011  . CARDIAC STINT IMPLANT  2008  . CARPAL TUNNEL RELEASE    . EYE SURGERY    . INGUINAL HERNIA REPAIR    . OOPHORECTOMY  2001   LAVH,BSO  . ULNA NERVE RELEASE  2009  . VAGINAL HYSTERECTOMY  2001   LAVH, BSO     Medications: Current Meds  Medication Sig  . Ascorbic Acid (VITAMIN C) 1000 MG tablet Take 1,000 mg by mouth daily.  Marland Kitchen aspirin 81 MG tablet Take 81 mg by mouth daily.    Marland Kitchen atorvastatin (LIPITOR) 40 MG tablet   . cetirizine (ZYRTEC) 10 MG tablet Take 10 mg by mouth daily.    . Cholecalciferol (VITAMIN D) 2000 units CAPS Take 2,000 Units by mouth daily.  Marland Kitchen  clorazepate (TRANXENE) 7.5 MG tablet Take 7.5 mg by mouth once as needed. For nerves  . Coenzyme Q10 (CO Q 10 PO) Take 1 tablet by mouth every morning.  Marland Kitchen glucosamine-chondroitin 500-400 MG tablet Take 1 tablet by mouth 2 (two) times daily.   Marland Kitchen lisinopril (PRINIVIL,ZESTRIL) 40 MG tablet Take 40 mg by mouth daily.  . nitroGLYCERIN (NITROSTAT) 0.4 MG SL tablet Place 1 tablet (0.4 mg total) under the tongue every 5 (five) minutes as needed for chest pain.  . pantoprazole (PROTONIX) 40 MG tablet   . [DISCONTINUED] nitroGLYCERIN (NITROSTAT) 0.4 MG SL tablet Place 1 tablet (0.4 mg total) under the tongue every 5 (five) minutes as needed for chest pain.     Allergies: Allergies  Allergen Reactions  . Sulfa Antibiotics Nausea And Vomiting    Social History: The patient  reports that she has quit smoking. She has never used smokeless tobacco. She reports that she does not drink alcohol or use drugs.   Family History:  The patient's family history includes Asthma in her brother; CAD in her father; COPD in her brother; Diabetes in her brother; Heart attack in her father and mother; Heart disease in her brother; Hypertension in her brother and mother.   Review of Systems: Please see the history of present illness.   Otherwise, the review of systems is positive for none.   All other systems are reviewed and negative.   Physical Exam: VS:  BP (!) 190/90 (BP Location: Left Arm, Patient Position: Sitting, Cuff Size: Normal)   Pulse (!) 57   Ht 5\' 4"  (1.626 m)   Wt 140 lb (63.5 kg)   LMP  (LMP Unknown)   BMI 24.03 kg/m  .  BMI Body mass index is 24.03 kg/m.  Wt Readings from Last 3 Encounters:  05/30/18 140 lb (63.5 kg)  02/13/18 144 lb (65.3 kg)  09/14/17 142 lb 3.2 oz (64.5 kg)   BP recheck by me is 180/80 in both arms.  General: Elderly. She is just delightful. Alert and in no acute distress.   HEENT: Normal.  Neck: Supple, no JVD, carotid bruits, or masses noted.  Cardiac: Regular rate and rhythm. Harsh outflow murmur. No edema.  Respiratory:  Lungs are clear to auscultation bilaterally with normal work of breathing.  GI: Soft and nontender.  MS: No deformity or atrophy. Gait and ROM intact.  Skin: Warm and dry. Color is normal.  Neuro:  Strength and sensation are intact and no gross focal deficits noted.  Psych: Alert, appropriate and with normal affect.   LABORATORY DATA:  EKG:  EKG is ordered today. This demonstrates sinus brady - T wave changes in V3 - look new.  Lab Results  Component Value Date   WBC 6.2 02/27/2012   HGB 12.2 02/27/2012   HCT 35.9 (L) 02/27/2012   PLT 193 02/27/2012   GLUCOSE 95 02/27/2012   ALT 11 02/27/2012   AST 22 02/27/2012   NA 137 02/27/2012   K 4.1 02/27/2012   CL 101 02/27/2012   CREATININE 0.90 02/27/2012   BUN 11 02/27/2012   CO2 25 02/27/2012   INR 1.0 11/11/2006       BNP (last 3 results) No results for input(s): BNP in the last 8760  hours.  ProBNP (last 3 results) No results for input(s): PROBNP in the last 8760 hours.   Other Studies Reviewed Today:   Assessment/Plan:  1. Chest pain - known CAD with remote DES to the RCA - having chest  pain after exertion - EKG with changes in V3 - has murmur - will add Norvasc 5 mg (she has been on this in the past - did have some swelling in the remote past but willing to retry) - this will help both BP and chest pain. Will get echo. Will try to manage medically. Lab today. Refilled her NTG. Further disposition to follow.   2. HLD - on statin  3. GERD - already back on PPI  4. HTN - see above.   Current medicines are reviewed with the patient today.  The patient does not have concerns regarding medicines other than what has been noted above.  The following changes have been made:  See above.  Labs/ tests ordered today include:    Orders Placed This Encounter  Procedures  . Basic metabolic panel  . CBC  . EKG 12-Lead  . ECHOCARDIOGRAM COMPLETE     Disposition:   Further disposition pending.   Patient is agreeable to this plan and will call if any problems develop in the interim.   SignedTruitt Merle, NP  05/30/2018 2:06 PM  [place] [address] [place], [place]  [address] Phone: [phone] Fax: [phone]

## 2018-05-30 NOTE — Patient Instructions (Addendum)
We will be checking the following labs today - BMET & CBC    Medication Instructions:    Continue with your current medicines. BUT  I am adding Norvasc 5 mg to take one a day - I sent this to your pharmacy  I refilled the NTG    If you need a refill on your cardiac medications before your next appointment, please call your pharmacy.     Testing/Procedures To Be Arranged:  N/A  Follow-Up:   I want you to call us on Monday and let us know how you are doing - we will then determine if you need a follow up visit.     At [facility], you and your health needs are our priority.  As part of our continuing mission to provide you with exceptional heart care, we have created designated Provider Care Teams.  These Care Teams include your primary Cardiologist (physician) and Advanced Practice Providers (APPs -  Physician Assistants and Nurse Practitioners) who all work together to provide you with the care you need, when you need it.  Special Instructions:  . None  Call the [place] office at [phone] if you have any questions, problems or concerns.

## 2018-05-31 LAB — CBC
Hematocrit: 36 % (ref 34.0–46.6)
Hemoglobin: 11.9 g/dL (ref 11.1–15.9)
MCH: 30.4 pg (ref 26.6–33.0)
MCHC: 33.1 g/dL (ref 31.5–35.7)
MCV: 92 fL (ref 79–97)
Platelets: 209 10*3/uL (ref 150–450)
RBC: 3.91 x10E6/uL (ref 3.77–5.28)
RDW: 13.7 % (ref 11.7–15.4)
WBC: 5.8 10*3/uL (ref 3.4–10.8)

## 2018-05-31 LAB — BASIC METABOLIC PANEL
BUN/Creatinine Ratio: 17 (ref 12–28)
BUN: 15 mg/dL (ref 8–27)
CO2: 26 mmol/L (ref 20–29)
Calcium: 9.2 mg/dL (ref 8.7–10.3)
Chloride: 103 mmol/L (ref 96–106)
Creatinine, Ser: 0.89 mg/dL (ref 0.57–1.00)
GFR calc Af Amer: 66 mL/min/{1.73_m2} (ref >59)
GFR calc non Af Amer: 58 mL/min/{1.73_m2} — ABNORMAL LOW (ref >59)
Glucose: 84 mg/dL (ref 65–99)
Potassium: 4.5 mmol/L (ref 3.5–5.2)
Sodium: 143 mmol/L (ref 134–144)

## 2018-06-05 ENCOUNTER — Telehealth: Payer: Self-pay | Admitting: Nurse Practitioner

## 2018-06-05 ENCOUNTER — Other Ambulatory Visit: Payer: Self-pay | Admitting: *Deleted

## 2018-06-05 MED ORDER — AMLODIPINE BESYLATE 5 MG PO TABS: 5.0000 mg | ORAL_TABLET | Freq: Every day | ORAL | 3 refills | Status: DC

## 2018-06-05 NOTE — Telephone Encounter (Signed)
Patient called to review symptoms - she is feeling better. Much less chest pain. BP has improved as well - down to 136/78. Does have a little pedal edema - she has not been elevating her feet.   She is happy with staying on the current plan of care. She will start elevating her feet. She is to restrict salt as best she can.   She is to let us know if problems arise. Appointment made for early May to see back here. Norvasc #90 sent to her pharmacy today.   Burtis Junes, RN, [place] [address] [place], [place]  [address] [phone]

## 2018-07-11 ENCOUNTER — Telehealth: Payer: Self-pay | Admitting: *Deleted

## 2018-07-11 NOTE — Telephone Encounter (Signed)

## 2018-07-13 NOTE — Progress Notes (Signed)
Telehealth Visit     Virtual Visit via Video Note   This visit type was conducted due to national recommendations for restrictions regarding the COVID-19 Pandemic (e.g. social distancing) in an effort to limit this patient's exposure and mitigate transmission in our community.  Due to her co-morbid illnesses, this patient is at least at moderate risk for complications without adequate follow up.  This format is felt to be most appropriate for this patient at this time.  All issues noted in this document were discussed and addressed.  A limited physical exam was performed with this format.  Please refer to the patient's chart for her consent to telehealth for [facility].   Evaluation Performed:  Follow-up visit  This visit type was conducted due to national recommendations for restrictions regarding the COVID-19 Pandemic (e.g. social distancing).  This format is felt to be most appropriate for this patient at this time.  All issues noted in this document were discussed and addressed.  No physical exam was performed (except for noted visual exam findings with Video Visits).  Please refer to the patient's chart (MyChart message for video visits and phone note for telephone visits) for the patient's consent to telehealth for [facility].  Date:  07/14/2018   ID:  [name], DOB [birth year], MRN [id number]  Patient Location:  Home  Provider location:   Home  PCP:  Marton Redwood, MD  Cardiologist:  Servando Snare & Candee Furbish, MD  Electrophysiologist:  None   Chief Complaint:  Follow up.  History of Present Illness:    Alicen Donalson is a 85 y.o. female who presents via audio/video conferencing for a telehealth visit today.  Seen for Dr. Marlou Porch.   She has known CAD with prior DES to proximal RCA from August 2008. Follow-up stress test 2009 showed no ischemia. Other issues include HLD.   She was last seen here in December by Dr. Marlou Porch - noted some occasional throat discomfort with  heavy exertional activity.  I then saw her for a work in in March of 2020 with chest pain and shortness of breath - she had been out of her PPI - eating out most meals - opted to try and manage medically - Norvasc was added. PPI restarted and echo was obtained - this was stable. On a follow up phone call with the patient she noted she was feeling much better and opted to stay on current course of therapy.  The patient does not have symptoms concerning for COVID-19 infection (fever, chills, cough, or new shortness of breath).   Seen today via Doximity video. She has consented for this visit. She is doing ok. Staying home. No more chest pain. Breathing is stable. BP looks much better. Feels ok on her medicines. Little ankle swelling - she has had this when on Norvasc before - better today - she has her legs up. She says it is very tolerable. Overall, she is happy with how she is doing.   Past Medical History:  Diagnosis Date  . CAD (coronary artery disease)   . DUB (dysfunctional uterine bleeding)   . Elevated cholesterol   . GERD (gastroesophageal reflux disease)   . Hypertension   . Plantar fasciitis   . Shingles    Past Surgical History:  Procedure Laterality Date  . APPENDECTOMY  2011  . CARDIAC STINT IMPLANT  2008  . CARPAL TUNNEL RELEASE    . EYE SURGERY    . INGUINAL HERNIA REPAIR    .  OOPHORECTOMY  2001   LAVH,BSO  . ULNA NERVE RELEASE  2009  . VAGINAL HYSTERECTOMY  2001   LAVH, BSO     Current Meds  Medication Sig  . amLODipine (NORVASC) 5 MG tablet Take 1 tablet (5 mg total) by mouth daily.  . Ascorbic Acid (VITAMIN C) 1000 MG tablet Take 1,000 mg by mouth daily.  Marland Kitchen aspirin 81 MG tablet Take 81 mg by mouth daily.    Marland Kitchen atorvastatin (LIPITOR) 40 MG tablet   . cetirizine (ZYRTEC) 10 MG tablet Take 10 mg by mouth daily.    . Cholecalciferol (VITAMIN D) 2000 units CAPS Take 2,000 Units by mouth daily.  . clorazepate (TRANXENE) 7.5 MG tablet Take 7.5 mg by mouth once as  needed. For nerves  . Coenzyme Q10 (CO Q 10 PO) Take 1 tablet by mouth every morning.  Marland Kitchen glucosamine-chondroitin 500-400 MG tablet Take 1 tablet by mouth 2 (two) times daily.   Marland Kitchen lisinopril (PRINIVIL,ZESTRIL) 40 MG tablet Take 40 mg by mouth daily.  . nitroGLYCERIN (NITROSTAT) 0.4 MG SL tablet Place 1 tablet (0.4 mg total) under the tongue every 5 (five) minutes as needed for chest pain.  . pantoprazole (PROTONIX) 40 MG tablet Take 40 mg by mouth daily.   . [DISCONTINUED] amLODipine (NORVASC) 5 MG tablet Take 1 tablet (5 mg total) by mouth daily.     Allergies:   Sulfa antibiotics   Social History   Tobacco Use  . Smoking status: Former Research scientist (life sciences)  . Smokeless tobacco: Never Used  Substance Use Topics  . Alcohol use: No    Alcohol/week: 0.0 standard drinks  . Drug use: No     Family Hx: The patient's family history includes Asthma in her brother; CAD in her father; COPD in her brother; Diabetes in her brother; Heart attack in her father and mother; Heart disease in her brother; Hypertension in her brother and mother. There is no history of Breast cancer.  ROS:   Please see the history of present illness.   All other systems reviewed are negative.    Objective:    Vital Signs:  BP 114/68   Pulse 79   Temp (!) 95.7 F (35.4 C)   Ht 5\' 4"  (1.626 m)   Wt 134 lb (60.8 kg)   LMP  (LMP Unknown)   BMI 23.00 kg/m    Wt Readings from Last 3 Encounters:  07/14/18 134 lb (60.8 kg)  05/30/18 140 lb (63.5 kg)  02/13/18 144 lb (65.3 kg)    Alert female in no acute distress. She is not short of breath with conversation. Noted weight loss.     Labs/Other Tests and Data Reviewed:    Lab Results  Component Value Date   WBC 5.8 05/30/2018   HGB 11.9 05/30/2018   HCT 36.0 05/30/2018   PLT 209 05/30/2018   GLUCOSE 84 05/30/2018   ALT 11 02/27/2012   AST 22 02/27/2012   NA 143 05/30/2018   K 4.5 05/30/2018   CL 103 05/30/2018   CREATININE 0.89 05/30/2018   BUN 15 05/30/2018    CO2 26 05/30/2018   INR 1.0 11/11/2006       BNP (last 3 results) No results for input(s): BNP in the last 8760 hours.  ProBNP (last 3 results) No results for input(s): PROBNP in the last 8760 hours.    Prior CV studies:    The following studies were reviewed today:  ECHO IMPRESSIONS 05/2018   1. The left ventricle has  normal systolic function with an ejection fraction of 60-65%. The cavity size was normal. Left ventricular diastolic Doppler parameters are consistent with pseudonormalization. Elevated left ventricular end-diastolic pressure.  2. Normal GLS -18.4.  3. The right ventricle has normal systolic function. The cavity was normal. There is no increase in right ventricular wall thickness.  4. Moderate thickening of the mitral valve leaflet. Moderate calcification of the mitral valve leaflet. There is moderate mitral annular calcification present.  5. The aortic valve is tricuspid Moderate thickening of the aortic valve Moderate calcification of the aortic valve. Aortic valve regurgitation is trivial by color flow Doppler.   ASSESSMENT & PLAN:    1.  Chest pain - known CAD with remote DES to the RCA - recent episodes of chest pain after exertion - EKG with changes in V3 - had murmur as well - we added Norvasc 5 mg (she has been on this in the past - did have some swelling in the remote past but was willing to retry). Had favored medical management.  Now doing well. Chest pain resolved. Will stay on current course.   2. HLD - on statin  3. GERD - already back on PPI  4. HTN - BP is great. No changes recommended today.   5. Murmur - recent echo noted - EF is fine. Moderate calcification of aortic and mitral valves. Reviewed with her today.   6. COVID-19 Education: The signs and symptoms of COVID-19 were discussed with the patient and how to seek care for testing (follow up with PCP or arrange E-visit).  The importance of social distancing, staying at home, hand hygiene  and wearing a mask when out in public were discussed today.  Patient Risk:   After full review of this patient's clinical status, I feel that they are at least moderate risk at this time.  Time:   Today, I have spent 8 minutes with the patient with telehealth technology discussing the above issues.     Medication Adjustments/Labs and Tests Ordered: Current medicines are reviewed at length with the patient today.  Concerns regarding medicines are outlined above.   Tests Ordered: No orders of the defined types were placed in this encounter.   Medication Changes: Meds ordered this encounter  Medications  . amLODipine (NORVASC) 5 MG tablet    Sig: Take 1 tablet (5 mg total) by mouth daily.    Dispense:  90 tablet    Refill:  3    Order Specific Question:   Supervising Provider    Answer:   [place], PETER M [[id number]]    Disposition:  FU with Dr. Marlou Porch in 6 months. I am happy to see back as needed.   Patient is agreeable to this plan and will call if any problems develop in the interim.   Amie Critchley, NP  07/14/2018 2:25 PM    [facility]

## 2018-07-14 ENCOUNTER — Telehealth (INDEPENDENT_AMBULATORY_CARE_PROVIDER_SITE_OTHER): Payer: Medicare Other | Admitting: Nurse Practitioner

## 2018-07-14 ENCOUNTER — Encounter: Payer: Self-pay | Admitting: Nurse Practitioner

## 2018-07-14 ENCOUNTER — Other Ambulatory Visit: Payer: Self-pay

## 2018-07-14 VITALS — BP 114/68 | HR 79 | Temp 95.7°F | Ht 64.0 in | Wt 134.0 lb

## 2018-07-14 DIAGNOSIS — I10 Essential (primary) hypertension: Secondary | ICD-10-CM

## 2018-07-14 DIAGNOSIS — K219 Gastro-esophageal reflux disease without esophagitis: Secondary | ICD-10-CM

## 2018-07-14 DIAGNOSIS — I2510 Atherosclerotic heart disease of native coronary artery without angina pectoris: Secondary | ICD-10-CM | POA: Diagnosis not present

## 2018-07-14 DIAGNOSIS — Z7189 Other specified counseling: Secondary | ICD-10-CM

## 2018-07-14 MED ORDER — AMLODIPINE BESYLATE 5 MG PO TABS: 5.0000 mg | ORAL_TABLET | Freq: Every day | ORAL | 3 refills | Status: DC

## 2018-07-14 NOTE — Progress Notes (Signed)
This note was done actually by Face Time conversation. She was not able to use Doximity.   Burtis Junes, RN, [place] [address] [place], [place]  [address] [phone]

## 2018-07-14 NOTE — Patient Instructions (Addendum)
After Visit Summary:  We will be checking the following labs today - NONE   Medication Instructions:    Continue with your current medicines.   I did send in a refill for the Amlodipine   If you need a refill on your cardiac medications before your next appointment, please call your pharmacy.     Testing/Procedures To Be Arranged:  N/A  Follow-Up:   See Dr. Marlou Porch in 6 months. I am happy to see you back as needed.    At [facility], you and your health needs are our priority.  As part of our continuing mission to provide you with exceptional heart care, we have created designated Provider Care Teams.  These Care Teams include your primary Cardiologist (physician) and Advanced Practice Providers (APPs -  Physician Assistants and Nurse Practitioners) who all work together to provide you with the care you need, when you need it.  Special Instructions:  . Stay safe, stay home, wash your hands for at least 20 seconds and wear a mask when out in public.  . It was good to talk with you today.  Marland Kitchen Keep propping your feet up!   Call the [place] office at [phone] if you have any questions, problems or concerns.

## 2018-10-05 ENCOUNTER — Other Ambulatory Visit: Payer: Self-pay | Admitting: Internal Medicine

## 2018-10-05 DIAGNOSIS — Z1231 Encounter for screening mammogram for malignant neoplasm of breast: Secondary | ICD-10-CM

## 2018-10-23 ENCOUNTER — Encounter: Payer: Self-pay | Admitting: Podiatry

## 2018-10-23 ENCOUNTER — Ambulatory Visit (INDEPENDENT_AMBULATORY_CARE_PROVIDER_SITE_OTHER): Payer: Medicare Other | Admitting: Podiatry

## 2018-10-23 ENCOUNTER — Other Ambulatory Visit: Payer: Self-pay

## 2018-10-23 VITALS — BP 124/66 | HR 59 | Temp 96.5°F | Resp 16

## 2018-10-23 DIAGNOSIS — I2510 Atherosclerotic heart disease of native coronary artery without angina pectoris: Secondary | ICD-10-CM | POA: Diagnosis not present

## 2018-10-23 DIAGNOSIS — B351 Tinea unguium: Secondary | ICD-10-CM

## 2018-10-23 DIAGNOSIS — L603 Nail dystrophy: Secondary | ICD-10-CM

## 2018-10-25 NOTE — Progress Notes (Signed)
Subjective:   Patient ID: [name], female   DOB: 85 y.o.   MRN: [id number]   HPI 85 year old female presents with concerns of her right big toenail becoming thickened discolored.  Is been getting worse the last several months.  She tries to clip without uppercase been using Vicks vapor rub on it which is been helpful.  No pain to the nail currently denies any redness or drainage or any swelling.  Review of Systems  All other systems reviewed and are negative.  Past Medical History:  Diagnosis Date  . CAD (coronary artery disease)   . DUB (dysfunctional uterine bleeding)   . Elevated cholesterol   . GERD (gastroesophageal reflux disease)   . Hypertension   . Plantar fasciitis   . Shingles     Past Surgical History:  Procedure Laterality Date  . APPENDECTOMY  2011  . CARDIAC STINT IMPLANT  2008  . CARPAL TUNNEL RELEASE    . EYE SURGERY    . INGUINAL HERNIA REPAIR    . OOPHORECTOMY  2001   LAVH,BSO  . ULNA NERVE RELEASE  2009  . VAGINAL HYSTERECTOMY  2001   LAVH, BSO     Current Outpatient Medications:  .  NON FORMULARY, [place] [facility]  ANTI-FUNGAL (NAIL)-#1, Disp: , Rfl:  .  amLODipine (NORVASC) 5 MG tablet, Take 1 tablet (5 mg total) by mouth daily., Disp: 90 tablet, Rfl: 3 .  Ascorbic Acid (VITAMIN C) 1000 MG tablet, Take 1,000 mg by mouth daily., Disp: , Rfl:  .  aspirin 81 MG tablet, Take 81 mg by mouth daily.  , Disp: , Rfl:  .  atorvastatin (LIPITOR) 40 MG tablet, , Disp: , Rfl:  .  cetirizine (ZYRTEC) 10 MG tablet, Take 10 mg by mouth daily.  , Disp: , Rfl:  .  Cholecalciferol (VITAMIN D) 2000 units CAPS, Take 2,000 Units by mouth daily., Disp: , Rfl:  .  clorazepate (TRANXENE) 7.5 MG tablet, Take 7.5 mg by mouth once as needed. For nerves, Disp: , Rfl:  .  Coenzyme Q10 (CO Q 10 PO), Take 1 tablet by mouth every morning., Disp: , Rfl:  .  glucosamine-chondroitin 500-400 MG tablet, Take 1 tablet by mouth 2 (two) times daily. , Disp: , Rfl:  .  lisinopril  (PRINIVIL,ZESTRIL) 40 MG tablet, Take 40 mg by mouth daily., Disp: , Rfl:  .  nitroGLYCERIN (NITROSTAT) 0.4 MG SL tablet, Place 1 tablet (0.4 mg total) under the tongue every 5 (five) minutes as needed for chest pain., Disp: 25 tablet, Rfl: 3 .  pantoprazole (PROTONIX) 40 MG tablet, Take 40 mg by mouth daily. , Disp: , Rfl:   Allergies  Allergen Reactions  . Sulfa Antibiotics Nausea And Vomiting         Objective:  Physical Exam  General: AAO x3, NAD  Dermatological: The right hallux toenails hypertrophic, dystrophic with yellow-brown discoloration but there is no pain there is no redness or drainage or signs of infection.  No open lesions.  Vascular: Dorsalis Pedis artery and Posterior Tibial artery pedal pulses are 2/4 bilateral with immedate capillary fill time.  There is no pain with calf compression, swelling, warmth, erythema.   Neruologic: Grossly intact via light touch bilateral.  Protective threshold with Semmes Wienstein monofilament intact to all pedal sites bilateral.   Musculoskeletal: No gross boney pedal deformities bilateral. No pain, crepitus, or limitation noted with foot and ankle range of motion bilateral. Muscular strength 5/5 in all groups tested bilateral.  Assessment:   Right hallux onychodystrophy, hammertoes     Plan:  -Treatment options discussed including all alternatives, risks, and complications -Etiology of symptoms were discussed -Discussed nail removal as well as other mom invasive options.  She wants to hold off on removing the nail at this time.  I did identify any complications.  I ordered a compound cream today to [employer] [facility].  Directed on use and side effects.  No follow-ups on file.  Trula Slade DPM

## 2018-11-10 DIAGNOSIS — Z961 Presence of intraocular lens: Secondary | ICD-10-CM | POA: Diagnosis not present

## 2018-11-10 DIAGNOSIS — H31002 Unspecified chorioretinal scars, left eye: Secondary | ICD-10-CM | POA: Diagnosis not present

## 2018-11-10 DIAGNOSIS — H5213 Myopia, bilateral: Secondary | ICD-10-CM | POA: Diagnosis not present

## 2018-11-10 DIAGNOSIS — H35373 Puckering of macula, bilateral: Secondary | ICD-10-CM | POA: Diagnosis not present

## 2018-11-22 ENCOUNTER — Other Ambulatory Visit: Payer: Self-pay

## 2018-11-22 ENCOUNTER — Ambulatory Visit
Admission: RE | Admit: 2018-11-22 | Discharge: 2018-11-22 | Disposition: A | Discharge status: Home / Self Care | Payer: Medicare Other | Source: Ambulatory Visit | Attending: Internal Medicine | Admitting: Internal Medicine

## 2018-11-22 DIAGNOSIS — Z1231 Encounter for screening mammogram for malignant neoplasm of breast: Secondary | ICD-10-CM | POA: Diagnosis not present

## 2018-11-24 ENCOUNTER — Other Ambulatory Visit: Payer: Self-pay

## 2018-11-24 DIAGNOSIS — Z20822 Contact with and (suspected) exposure to covid-19: Secondary | ICD-10-CM

## 2018-11-26 LAB — NOVEL CORONAVIRUS, NAA: SARS-CoV-2, NAA: NOT DETECTED

## 2018-11-27 ENCOUNTER — Telehealth: Payer: Self-pay | Admitting: General Practice

## 2018-11-27 NOTE — Telephone Encounter (Signed)
Patient informed of negative covid-19 result. Patient verbalized understanding.  °

## 2019-01-11 DIAGNOSIS — Z23 Encounter for immunization: Secondary | ICD-10-CM | POA: Diagnosis not present

## 2019-01-23 ENCOUNTER — Other Ambulatory Visit: Payer: Self-pay

## 2019-01-23 ENCOUNTER — Ambulatory Visit (INDEPENDENT_AMBULATORY_CARE_PROVIDER_SITE_OTHER): Payer: Medicare Other | Admitting: Cardiology

## 2019-01-23 ENCOUNTER — Encounter: Payer: Self-pay | Admitting: Cardiology

## 2019-01-23 VITALS — BP 140/60 | HR 82 | Ht 64.0 in | Wt 146.0 lb

## 2019-01-23 DIAGNOSIS — K219 Gastro-esophageal reflux disease without esophagitis: Secondary | ICD-10-CM | POA: Diagnosis not present

## 2019-01-23 DIAGNOSIS — I2510 Atherosclerotic heart disease of native coronary artery without angina pectoris: Secondary | ICD-10-CM

## 2019-01-23 DIAGNOSIS — I10 Essential (primary) hypertension: Secondary | ICD-10-CM | POA: Diagnosis not present

## 2019-01-23 NOTE — Patient Instructions (Signed)
Medication Instructions:  The current medical regimen is effective;  continue present plan and medications.  *If you need a refill on your cardiac medications before your next appointment, please call your pharmacy*  Follow-Up: At [facility], you and your health needs are our priority.  As part of our continuing mission to provide you with exceptional heart care, we have created designated Provider Care Teams.  These Care Teams include your primary Cardiologist (physician) and Advanced Practice Providers (APPs -  Physician Assistants and Nurse Practitioners) who all work together to provide you with the care you need, when you need it.  Your next appointment:   6 months  The format for your next appointment:   In Person  Provider:   Truitt Merle, NP and 1 year with Dr Marlou Porch.  Thank you for choosing [facility]!!

## 2019-01-23 NOTE — Progress Notes (Signed)
Cardiology Office Note:    Date:  01/23/2019   ID:  [name], DOB 1926-04-03, MRN [id number]  PCP:  Marton Redwood, MD  Cardiologist:  Candee Furbish, MD  Electrophysiologist:  None   Referring MD: Marton Redwood, MD     History of Present Illness:    [name] is a 85 y.o. female here for follow-up of coronary artery disease with prior DES to proximal RCA in August 2008.  Follow-up stress test 2009 showed no ischemia.  Normal EF.  On atorvastatin  Occasionally will have throat discomfort with heavy exertional activity, rare.  01/23/2019-here for follow-up of CAD.  Unfortunately [name] her husband and patient of ours has died, on hospice.  This was about a month ago.  She is overall hanging in there.  Denies any chest pain fever chills nausea vomiting syncope.  We talked about this today.    Past Medical History:  Diagnosis Date   CAD (coronary artery disease)    DUB (dysfunctional uterine bleeding)    Elevated cholesterol    GERD (gastroesophageal reflux disease)    Hypertension    Plantar fasciitis    Shingles     Past Surgical History:  Procedure Laterality Date   APPENDECTOMY  2011   CARDIAC STINT IMPLANT  2008   CARPAL TUNNEL RELEASE     EYE SURGERY     INGUINAL HERNIA REPAIR     OOPHORECTOMY  2001   LAVH,BSO   ULNA NERVE RELEASE  2009   VAGINAL HYSTERECTOMY  2001   LAVH, BSO    Current Medications: Current Meds  Medication Sig   amLODipine (NORVASC) 5 MG tablet Take 1 tablet (5 mg total) by mouth daily.   Ascorbic Acid (VITAMIN C) 1000 MG tablet Take 1,000 mg by mouth daily.   aspirin 81 MG tablet Take 81 mg by mouth daily.     atorvastatin (LIPITOR) 40 MG tablet Take 40 mg by mouth daily.    cetirizine (ZYRTEC) 10 MG tablet Take 10 mg by mouth daily.     Cholecalciferol (VITAMIN D) 2000 units CAPS Take 2,000 Units by mouth daily.   clorazepate (TRANXENE) 7.5 MG tablet Take 7.5 mg by mouth once as needed. For nerves    Coenzyme Q10 (CO Q 10 PO) Take 1 tablet by mouth every morning.   glucosamine-chondroitin 500-400 MG tablet Take 1 tablet by mouth 2 (two) times daily.    lisinopril (PRINIVIL,ZESTRIL) 40 MG tablet Take 40 mg by mouth daily.   nitroGLYCERIN (NITROSTAT) 0.4 MG SL tablet Place 1 tablet (0.4 mg total) under the tongue every 5 (five) minutes as needed for chest pain.   NON FORMULARY [facility]  ANTI-FUNGAL (NAIL)-#1   pantoprazole (PROTONIX) 40 MG tablet Take 40 mg by mouth daily.      Allergies:   Sulfa antibiotics   Social History   Socioeconomic History   Marital status: Married    Spouse name: Not on file   Number of children: Not on file   Years of education: Not on file   Highest education level: Not on file  Occupational History   Not on file  Social Needs   Financial resource strain: Not on file   Food insecurity    Worry: Not on file    Inability: Not on file   Transportation needs    Medical: Not on file    Non-medical: Not on file  Tobacco Use   Smoking status: Former Smoker   Smokeless tobacco: Never Used  Substance and Sexual Activity   Alcohol use: No    Alcohol/week: 0.0 standard drinks   Drug use: No   Sexual activity: Never    Birth control/protection: Post-menopausal, Surgical    Comment: INTERCOURSE AGE 70, SEXUAL PARTNERS LEWSS THAN 5  Lifestyle   Physical activity    Days per week: Not on file    Minutes per session: Not on file   Stress: Not on file  Relationships   Social connections    Talks on phone: Not on file    Gets together: Not on file    Attends religious service: Not on file    Active member of club or organization: Not on file    Attends meetings of clubs or organizations: Not on file    Relationship status: Not on file  Other Topics Concern   Not on file  Social History Narrative   Not on file     Family History: The patient's family history includes Asthma in her brother; CAD in her father;  COPD in her brother; Diabetes in her brother; Heart attack in her father and mother; Heart disease in her brother; Hypertension in her brother and mother. There is no history of Breast cancer.  ROS:   Please see the history of present illness.    Negative for fevers chills nausea vomiting syncope all other systems reviewed and are negative.  EKGs/Labs/Other Studies Reviewed:    The following studies were reviewed today: Prior office notes, EKG, lab work  EKG: 02/13/2018-sinus bradycardia 58 poor R wave progression personally reviewed and interpreted  Recent Labs: 05/30/2018: BUN 15; Creatinine, Ser 0.89; Hemoglobin 11.9; Platelets 209; Potassium 4.5; Sodium 143  Recent Lipid Panel No results found for: CHOL, TRIG, HDL, CHOLHDL, VLDL, LDLCALC, LDLDIRECT  Physical Exam:    VS:  BP 140/60    Pulse 82    Ht 5\' 4"  (1.626 m)    Wt 146 lb (66.2 kg)    LMP  (LMP Unknown)    SpO2 99%    BMI 25.06 kg/m     Wt Readings from Last 3 Encounters:  01/23/19 146 lb (66.2 kg)  07/14/18 134 lb (60.8 kg)  05/30/18 140 lb (63.5 kg)     GEN: Well nourished, well developed, in no acute distress  HEENT: normal  Neck: no JVD, carotid bruits, or masses Cardiac: RRR; no murmurs, rubs, or gallops,no edema  Respiratory:  clear to auscultation bilaterally, normal work of breathing GI: soft, nontender, nondistended, + BS MS: no deformity or atrophy  Skin: warm and dry, no rash Neuro:  Alert and Oriented x 3, Strength and sensation are intact Psych: euthymic mood, full affect   ASSESSMENT:    1. Atherosclerosis of native coronary artery of native heart without angina pectoris   2. Gastroesophageal reflux disease without esophagitis   3. Essential hypertension    PLAN:    In order of problems listed above:  Coronary artery disease -DES to RCA.  Subsequent stress test low risk.  Doing well.  Minor throat tightness.  Continue with secondary prevention.  Aspirin, atorvastatin  Hyperlipidemia - Doing  well.  No myalgias.  Feels well.  Continue with goal LDL less than 70.  Dr. Brigitte Pulse has been monitoring.  GERD  - stopped TEA. Helped.  No discussion today  Essential hypertension   -Usually well controlled.  Mildly elevated systolic  Leg swelling -Recently did go for a long car trip from [place].  She is not having any pain.  Has  noticed some bilateral leg swelling.  She is also taking amlodipine 5 mg.  No pain in the calves.  If this does not get better, we will have low threshold for scanning her lower extremity venous Dopplers.  For now, continue to look for progress.  Elevate legs.  Stockings if necessary.    Medication Adjustments/Labs and Tests Ordered: Current medicines are reviewed at length with the patient today.  Concerns regarding medicines are outlined above.  No orders of the defined types were placed in this encounter.  No orders of the defined types were placed in this encounter.   Patient Instructions  Medication Instructions:  The current medical regimen is effective;  continue present plan and medications.  *If you need a refill on your cardiac medications before your next appointment, please call your pharmacy*  Follow-Up: At [facility], you and your health needs are our priority.  As part of our continuing mission to provide you with exceptional heart care, we have created designated Provider Care Teams.  These Care Teams include your primary Cardiologist (physician) and Advanced Practice Providers (APPs -  Physician Assistants and Nurse Practitioners) who all work together to provide you with the care you need, when you need it.  Your next appointment:   6 months  The format for your next appointment:   In Person  Provider:   Truitt Merle, NP and 1 year with Dr Marlou Porch.  Thank you for choosing [facility], [place]!!         Signed, Candee Furbish, MD  01/23/2019 5:33 PM    [place]

## 2019-03-26 DIAGNOSIS — J309 Allergic rhinitis, unspecified: Secondary | ICD-10-CM | POA: Diagnosis not present

## 2019-03-26 DIAGNOSIS — Z9861 Coronary angioplasty status: Secondary | ICD-10-CM | POA: Diagnosis not present

## 2019-03-26 DIAGNOSIS — Z1331 Encounter for screening for depression: Secondary | ICD-10-CM | POA: Diagnosis not present

## 2019-03-26 DIAGNOSIS — I129 Hypertensive chronic kidney disease with stage 1 through stage 4 chronic kidney disease, or unspecified chronic kidney disease: Secondary | ICD-10-CM | POA: Diagnosis not present

## 2019-03-26 DIAGNOSIS — E785 Hyperlipidemia, unspecified: Secondary | ICD-10-CM | POA: Diagnosis not present

## 2019-03-26 DIAGNOSIS — Z0000 Encounter for general adult medical examination without abnormal findings: Secondary | ICD-10-CM | POA: Diagnosis not present

## 2019-03-26 DIAGNOSIS — Z1339 Encounter for screening examination for other mental health and behavioral disorders: Secondary | ICD-10-CM | POA: Diagnosis not present

## 2019-03-26 DIAGNOSIS — M8580 Other specified disorders of bone density and structure, unspecified site: Secondary | ICD-10-CM | POA: Diagnosis not present

## 2019-03-26 DIAGNOSIS — K219 Gastro-esophageal reflux disease without esophagitis: Secondary | ICD-10-CM | POA: Diagnosis not present

## 2019-03-26 DIAGNOSIS — K5909 Other constipation: Secondary | ICD-10-CM | POA: Diagnosis not present

## 2019-03-26 DIAGNOSIS — N1831 Chronic kidney disease, stage 3a: Secondary | ICD-10-CM | POA: Diagnosis not present

## 2019-04-04 ENCOUNTER — Ambulatory Visit: Payer: Medicare Other | Attending: Internal Medicine

## 2019-04-04 DIAGNOSIS — Z23 Encounter for immunization: Secondary | ICD-10-CM | POA: Diagnosis not present

## 2019-04-24 ENCOUNTER — Ambulatory Visit: Payer: Medicare Other | Attending: Internal Medicine

## 2019-04-24 DIAGNOSIS — Z23 Encounter for immunization: Secondary | ICD-10-CM

## 2019-04-24 NOTE — Progress Notes (Signed)
   Covid-19 Vaccination Clinic  Name:  [name]    MRN: [id number] DOB: [birth year]  04/24/2019  [name] was observed post Covid-19 immunization for 15 minutes without incidence. She was provided with Vaccine Information Sheet and instruction to access the V-Safe system.   [name] was instructed to call 911 with any severe reactions post vaccine: Marland Kitchen Difficulty breathing  . Swelling of your face and throat  . A fast heartbeat  . A bad rash all over your body  . Dizziness and weakness    Immunizations Administered    Name Date Dose VIS Date Route   Pfizer COVID-19 Vaccine 04/24/2019  8:43 AM 0.3 mL 02/23/2019 Intramuscular   Manufacturer: [place]   Lot: [id number]   [place]: [id number]

## 2019-08-02 ENCOUNTER — Ambulatory Visit (INDEPENDENT_AMBULATORY_CARE_PROVIDER_SITE_OTHER): Payer: Medicare Other | Admitting: Surgery

## 2019-08-02 ENCOUNTER — Encounter: Payer: Self-pay | Admitting: Surgery

## 2019-08-02 ENCOUNTER — Other Ambulatory Visit: Payer: Self-pay

## 2019-08-02 ENCOUNTER — Ambulatory Visit: Payer: Self-pay

## 2019-08-02 VITALS — BP 129/75 | HR 80 | Ht 64.0 in | Wt 146.0 lb

## 2019-08-02 DIAGNOSIS — M79671 Pain in right foot: Secondary | ICD-10-CM

## 2019-08-02 DIAGNOSIS — S92301A Fracture of unspecified metatarsal bone(s), right foot, initial encounter for closed fracture: Secondary | ICD-10-CM | POA: Diagnosis not present

## 2019-08-02 NOTE — Progress Notes (Signed)
Office Visit Note   Patient: [name]           Date of Birth: 1926/06/27           MRN: [id number] Visit Date: 08/02/2019              Requested by: Marton Redwood, MD [address] [place],  [place] [address] PCP: Marton Redwood, MD   Assessment & Plan: Visit Diagnoses:  1. Right foot pain   2. Closed nondisplaced fracture of metatarsal bone of right foot, unspecified metatarsal, initial encounter     Plan: I reviewed x-rays today with Dr. Meridee Score.  He agreed that patient may have acute on chronic fractures of the second third and fourth metatarsal heads.  I was going to put patient in a short cam boot but she was having some difficulty walking with this.  I decided to give her a postop shoe and she did better with this.  Did not have any pain with weightbearing.  Advised her to use this at all times when she is up and ambulating.  Follow-up with Dr. Sharol Given in 2 weeks for recheck.  Follow-Up Instructions: Return in about 2 weeks (around 08/16/2019) for with dr duda recheck right foot metatarsal fractures.   Orders:  Orders Placed This Encounter  Procedures  . XR Foot 2 Views Right   No orders of the defined types were placed in this encounter.     Procedures: No procedures performed   Clinical Data: No additional findings.   Subjective: Chief Complaint  Patient presents with  . Right Foot - Pain, Injury    HPI 86 year old white female comes in today with complaints of right foot pain.  Patient states a few days ago she was walking her dog when she stepped off the curb twisting her foot.  Localizes pain to the second third and fourth metatarsals.  Has had some swelling and slight bruising.  Pain when she is weightbearing.  She cannot recall any previous injuries of her foot before a few days ago. Review of Systems No current cardiac pulmonary GI GU issues  Objective: Vital Signs: BP 129/75   Pulse 80   Ht 5\' 4"  (1.626 m)   Wt 146 lb (66.2 kg)   LMP  (LMP  Unknown)   BMI 25.06 kg/m   Physical Exam HENT:     Head: Normocephalic.  Pulmonary:     Effort: No respiratory distress.  Musculoskeletal:     Comments: Exam right foot patient does have some swelling and trace bruising around the second third and fourth metatarsal heads.  Patient is moderate to marked tender over this area.  Rest of her foot is unremarkable.  Ankle good range of motion.  Nontender.  Neurological:     General: No focal deficit present.     Mental Status: She is alert and oriented to person, place, and time.     Ortho Exam  Specialty Comments:  No specialty comments available.  Imaging: No results found.   PMFS History: Patient Active Problem List   Diagnosis Date Noted  . Left knee pain 04/28/2015  . Atherosclerosis of native coronary artery of native heart without angina pectoris 11/22/2013  . Essential hypertension 11/22/2013  . Pure hypercholesterolemia 11/22/2013  . Inversion, nipple 09/05/2013  . Left ankle injury 02/14/2013  . Right foot pain 06/08/2011  . Shingles   . Elevated cholesterol   . Cancer ([place])   . Right knee pain 05/10/2011   Past  Medical History:  Diagnosis Date  . CAD (coronary artery disease)   . DUB (dysfunctional uterine bleeding)   . Elevated cholesterol   . GERD (gastroesophageal reflux disease)   . Hypertension   . Plantar fasciitis   . Shingles     Family History  Problem Relation Age of Onset  . Heart attack Mother   . Hypertension Mother   . Heart attack Father   . CAD Father   . Heart disease Brother   . Hypertension Brother   . Diabetes Brother   . COPD Brother   . Asthma Brother   . Breast cancer Neg Hx     Past Surgical History:  Procedure Laterality Date  . APPENDECTOMY  2011  . CARDIAC STINT IMPLANT  2008  . CARPAL TUNNEL RELEASE    . EYE SURGERY    . INGUINAL HERNIA REPAIR    . OOPHORECTOMY  2001   LAVH,BSO  . ULNA NERVE RELEASE  2009  . VAGINAL HYSTERECTOMY  2001   LAVH, BSO   Social  History   Occupational History  . Not on file  Tobacco Use  . Smoking status: Former Research scientist (life sciences)  . Smokeless tobacco: Never Used  Substance and Sexual Activity  . Alcohol use: No    Alcohol/week: 0.0 standard drinks  . Drug use: No  . Sexual activity: Never    Birth control/protection: Post-menopausal, Surgical    Comment: INTERCOURSE AGE 88, SEXUAL PARTNERS LEWSS THAN 5

## 2019-08-16 ENCOUNTER — Other Ambulatory Visit: Payer: Self-pay

## 2019-08-16 ENCOUNTER — Ambulatory Visit (INDEPENDENT_AMBULATORY_CARE_PROVIDER_SITE_OTHER): Payer: Medicare Other | Admitting: Orthopedic Surgery

## 2019-08-16 ENCOUNTER — Encounter: Payer: Self-pay | Admitting: Orthopedic Surgery

## 2019-08-16 VITALS — Ht 64.0 in | Wt 146.0 lb

## 2019-08-16 DIAGNOSIS — S92344A Nondisplaced fracture of fourth metatarsal bone, right foot, initial encounter for closed fracture: Secondary | ICD-10-CM

## 2019-08-20 ENCOUNTER — Encounter: Payer: Self-pay | Admitting: Orthopedic Surgery

## 2019-08-20 NOTE — Progress Notes (Signed)
Office Visit Note   Patient: [name]           Date of Birth: 04/14/1926           MRN: [id number] Visit Date: 08/16/2019              Requested by: Marton Redwood, MD [address] [place],  [place] [address] PCP: Marton Redwood, MD  Chief Complaint  Patient presents with  . Right Foot - Follow-up      HPI: Patient is a 86 year old woman who is seen in referral from Benjiman Core.  Patient is status post acute fracture of the second third and fourth metatarsals she is currently weightbearing with a regular shoe.  She is 3 weeks out from initial injury.  Patient states she is feeling better.  Assessment & Plan: Visit Diagnoses:  1. Closed nondisplaced fracture of fourth metatarsal bone of right foot, initial encounter     Plan: Patient will increase her activities as tolerated  Follow-Up Instructions: Return if symptoms worsen or fail to improve.   Ortho Exam  Patient is alert, oriented, no adenopathy, well-dressed, normal affect, normal respiratory effort. Examination patient has good dorsalis pedis pulses radiograph shows stable alignment of the metatarsal fractures 2 3 and 4.  She does have venous stasis swelling without ulceration.  Imaging: No results found. No images are attached to the encounter.  Labs: No results found for: HGBA1C, ESRSEDRATE, CRP, LABURIC, REPTSTATUS, GRAMSTAIN, CULT, LABORGA   Lab Results  Component Value Date   ALBUMIN 4.0 02/27/2012   ALBUMIN 4.2 02/08/2010    No results found for: MG No results found for: VD25OH  No results found for: PREALBUMIN CBC EXTENDED Latest Ref Rng & Units 05/30/2018 02/27/2012 11/07/2010  WBC 3.4 - 10.8 x10E3/uL 5.8 6.2 6.6  RBC 3.77 - 5.28 x10E6/uL 3.91 4.02 3.78(L)  HGB 11.1 - 15.9 g/dL 11.9 12.2 11.4(L)  HCT 34.0 - 46.6 % 36.0 35.9(L) 33.2(L)  PLT 150 - 450 x10E3/uL 209 193 205  NEUTROABS 1.7 - 7.7 K/uL - 4.1 4.6  LYMPHSABS 0.7 - 4.0 K/uL - 1.4 1.0     Body mass index is 25.06  kg/m.  Orders:  No orders of the defined types were placed in this encounter.  No orders of the defined types were placed in this encounter.    Procedures: No procedures performed  Clinical Data: No additional findings.  ROS:  All other systems negative, except as noted in the HPI. Review of Systems  Objective: Vital Signs: Ht 5\' 4"  (1.626 m)   Wt 146 lb (66.2 kg)   LMP  (LMP Unknown)   BMI 25.06 kg/m   Specialty Comments:  No specialty comments available.  PMFS History: Patient Active Problem List   Diagnosis Date Noted  . Left knee pain 04/28/2015  . Atherosclerosis of native coronary artery of native heart without angina pectoris 11/22/2013  . Essential hypertension 11/22/2013  . Pure hypercholesterolemia 11/22/2013  . Inversion, nipple 09/05/2013  . Left ankle injury 02/14/2013  . Right foot pain 06/08/2011  . Shingles   . Elevated cholesterol   . Cancer ([place])   . Right knee pain 05/10/2011   Past Medical History:  Diagnosis Date  . CAD (coronary artery disease)   . DUB (dysfunctional uterine bleeding)   . Elevated cholesterol   . GERD (gastroesophageal reflux disease)   . Hypertension   . Plantar fasciitis   . Shingles     Family History  Problem Relation Age of  Onset  . Heart attack Mother   . Hypertension Mother   . Heart attack Father   . CAD Father   . Heart disease Brother   . Hypertension Brother   . Diabetes Brother   . COPD Brother   . Asthma Brother   . Breast cancer Neg Hx     Past Surgical History:  Procedure Laterality Date  . APPENDECTOMY  2011  . CARDIAC STINT IMPLANT  2008  . CARPAL TUNNEL RELEASE    . EYE SURGERY    . INGUINAL HERNIA REPAIR    . OOPHORECTOMY  2001   LAVH,BSO  . ULNA NERVE RELEASE  2009  . VAGINAL HYSTERECTOMY  2001   LAVH, BSO   Social History   Occupational History  . Not on file  Tobacco Use  . Smoking status: Former Research scientist (life sciences)  . Smokeless tobacco: Never Used  Substance and Sexual Activity  .  Alcohol use: No    Alcohol/week: 0.0 standard drinks  . Drug use: No  . Sexual activity: Never    Birth control/protection: Post-menopausal, Surgical    Comment: INTERCOURSE AGE 32, SEXUAL PARTNERS LEWSS THAN 5

## 2019-11-13 ENCOUNTER — Other Ambulatory Visit (HOSPITAL_BASED_OUTPATIENT_CLINIC_OR_DEPARTMENT_OTHER): Payer: Self-pay | Admitting: Internal Medicine

## 2019-11-13 DIAGNOSIS — Z1231 Encounter for screening mammogram for malignant neoplasm of breast: Secondary | ICD-10-CM

## 2019-11-13 DIAGNOSIS — H04123 Dry eye syndrome of bilateral lacrimal glands: Secondary | ICD-10-CM | POA: Diagnosis not present

## 2019-11-13 DIAGNOSIS — H524 Presbyopia: Secondary | ICD-10-CM | POA: Diagnosis not present

## 2019-11-13 DIAGNOSIS — H31002 Unspecified chorioretinal scars, left eye: Secondary | ICD-10-CM | POA: Diagnosis not present

## 2019-11-13 DIAGNOSIS — H35373 Puckering of macula, bilateral: Secondary | ICD-10-CM | POA: Diagnosis not present

## 2019-11-26 ENCOUNTER — Encounter (HOSPITAL_BASED_OUTPATIENT_CLINIC_OR_DEPARTMENT_OTHER): Payer: Self-pay

## 2019-11-26 ENCOUNTER — Other Ambulatory Visit: Payer: Self-pay

## 2019-11-26 ENCOUNTER — Ambulatory Visit (HOSPITAL_BASED_OUTPATIENT_CLINIC_OR_DEPARTMENT_OTHER)
Admission: RE | Admit: 2019-11-26 | Discharge: 2019-11-26 | Disposition: A | Discharge status: Home / Self Care | Payer: Medicare Other | Source: Ambulatory Visit | Attending: Internal Medicine | Admitting: Internal Medicine

## 2019-11-26 DIAGNOSIS — Z1231 Encounter for screening mammogram for malignant neoplasm of breast: Secondary | ICD-10-CM | POA: Diagnosis not present

## 2019-12-12 DIAGNOSIS — Z23 Encounter for immunization: Secondary | ICD-10-CM | POA: Diagnosis not present

## 2020-01-23 ENCOUNTER — Encounter: Payer: Self-pay | Admitting: Cardiology

## 2020-01-23 ENCOUNTER — Ambulatory Visit (INDEPENDENT_AMBULATORY_CARE_PROVIDER_SITE_OTHER): Payer: Medicare Other | Admitting: Cardiology

## 2020-01-23 ENCOUNTER — Other Ambulatory Visit: Payer: Self-pay

## 2020-01-23 VITALS — BP 124/68 | HR 63 | Ht 64.0 in | Wt 151.0 lb

## 2020-01-23 DIAGNOSIS — I2510 Atherosclerotic heart disease of native coronary artery without angina pectoris: Secondary | ICD-10-CM

## 2020-01-23 DIAGNOSIS — I10 Essential (primary) hypertension: Secondary | ICD-10-CM

## 2020-01-23 NOTE — Progress Notes (Signed)
Cardiology Office Note:    Date:  01/23/2020   ID:  [name], DOB 1926/07/24, MRN [id number]  PCP:  Andrea Redwood, MD  [facility] Cardiologist:  Andrea Furbish, MD  [facility] Electrophysiologist:  None   Referring MD: Andrea Redwood, MD     History of Present Illness:    [name] is a 86 y.o. female here for the follow-up of coronary artery disease with prior DES to proximal RCA in August 2008. In 2009 she had a stress test that showed no ischemia. Normal ejection fraction.  Occasionally she may have some throat discomfort with heavy exertional activity but this seems to be a rare occurrence.  Back in 2020, her husband, [name], also a patient of ours had died on hospice.   Overall she been doing quite well.  In fact she went with her daughter on her 72th birthday to [facility] and stated [employer].  They enjoyed magician's act there.  Denies any recent chest discomfort.  She did ask about discoloration in her lower extremities.  Varicose veins.  She denies any fevers chills nausea vomiting syncope.  Past Medical History:  Diagnosis Date   CAD (coronary artery disease)    DUB (dysfunctional uterine bleeding)    Elevated cholesterol    GERD (gastroesophageal reflux disease)    Hypertension    Plantar fasciitis    Shingles     Past Surgical History:  Procedure Laterality Date   APPENDECTOMY  2011   CARDIAC STINT IMPLANT  2008   CARPAL TUNNEL RELEASE     EYE SURGERY     INGUINAL HERNIA REPAIR     OOPHORECTOMY  2001   LAVH,BSO   ULNA NERVE RELEASE  2009   VAGINAL HYSTERECTOMY  2001   LAVH, BSO    Current Medications: Current Meds  Medication Sig   amLODipine (NORVASC) 5 MG tablet Take 1 tablet (5 mg total) by mouth daily.   Ascorbic Acid (VITAMIN C) 1000 MG tablet Take 1,000 mg by mouth daily.   aspirin 81 MG tablet Take 81 mg by mouth daily.     atorvastatin (LIPITOR) 40 MG tablet Take 40 mg by mouth daily.    cetirizine (ZYRTEC)  10 MG tablet Take 10 mg by mouth daily.     Cholecalciferol (VITAMIN D) 2000 units CAPS Take 2,000 Units by mouth daily.   clorazepate (TRANXENE) 7.5 MG tablet Take 7.5 mg by mouth once as needed. For nerves   Coenzyme Q10 (CO Q 10 PO) Take 1 tablet by mouth every morning.   glucosamine-chondroitin 500-400 MG tablet Take 1 tablet by mouth 2 (two) times daily.    lisinopril (PRINIVIL,ZESTRIL) 40 MG tablet Take 40 mg by mouth daily.   nitroGLYCERIN (NITROSTAT) 0.4 MG SL tablet Place 1 tablet (0.4 mg total) under the tongue every 5 (five) minutes as needed for chest pain.   NON FORMULARY [facility]  ANTI-FUNGAL (NAIL)-#1   pantoprazole (PROTONIX) 40 MG tablet Take 40 mg by mouth daily.    Probiotic Product (ALIGN PO) Take by mouth.     Allergies:   Sulfa antibiotics   Social History   Socioeconomic History   Marital status: Widowed    Spouse name: Not on file   Number of children: Not on file   Years of education: Not on file   Highest education level: Not on file  Occupational History   Not on file  Tobacco Use   Smoking status: Former Smoker   Smokeless tobacco: Never  Used  Vaping Use   Vaping Use: Never used  Substance and Sexual Activity   Alcohol use: No    Alcohol/week: 0.0 standard drinks   Drug use: No   Sexual activity: Never    Birth control/protection: Post-menopausal, Surgical    Comment: INTERCOURSE AGE 60, SEXUAL PARTNERS LEWSS THAN 5  Other Topics Concern   Not on file  Social History Narrative   Not on file   Social Determinants of Health   Financial Resource Strain:    Difficulty of Paying Living Expenses: Not on file  Food Insecurity:    Worried About Charity fundraiser in the Last Year: Not on file   [employer] of Food in the Last Year: Not on file  Transportation Needs:    Lack of Transportation (Medical): Not on file   Lack of Transportation (Non-Medical): Not on file  Physical Activity:    Days of Exercise per  Week: Not on file   Minutes of Exercise per Session: Not on file  Stress:    Feeling of Stress : Not on file  Social Connections:    Frequency of Communication with Friends and Family: Not on file   Frequency of Social Gatherings with Friends and Family: Not on file   Attends Religious Services: Not on file   Active Member of Clubs or Organizations: Not on file   Attends Archivist Meetings: Not on file   Marital Status: Not on file     Family History: The patient's family history includes Asthma in her brother; CAD in her father; COPD in her brother; Diabetes in her brother; Heart attack in her father and mother; Heart disease in her brother; Hypertension in her brother and mother. There is no history of Breast cancer.  ROS:   Please see the history of present illness.     All other systems reviewed and are negative.  EKGs/Labs/Other Studies Reviewed:    The following studies were reviewed today:  ECHO 2020 1. The left ventricle has normal systolic function with an ejection  fraction of 60-65%. The cavity size was normal. Left ventricular diastolic  Doppler parameters are consistent with pseudonormalization. Elevated left  ventricular end-diastolic pressure.  2. Normal GLS -18.4.  3. The right ventricle has normal systolic function. The cavity was  normal. There is no increase in right ventricular wall thickness.  4. Moderate thickening of the mitral valve leaflet. Moderate  calcification of the mitral valve leaflet. There is moderate mitral  annular calcification present.  5. The aortic valve is tricuspid Moderate thickening of the aortic valve  Moderate calcification of the aortic valve. Aortic valve regurgitation is  trivial by color flow Doppler.   EKG:  EKG is  ordered today.  The ekg ordered today demonstrates sinus rhythm 63 no other abnormalities  Recent Labs: No results found for requested labs within last 8760 hours.  Recent Lipid Panel No  results found for: CHOL, TRIG, HDL, CHOLHDL, VLDL, LDLCALC, LDLDIRECT   Risk Assessment/Calculations:       Physical Exam:    VS:  BP 124/68    Pulse 63    Ht 5\' 4"  (1.626 m)    Wt 151 lb (68.5 kg)    LMP  (LMP Unknown)    SpO2 92%    BMI 25.92 kg/m     Wt Readings from Last 3 Encounters:  01/23/20 151 lb (68.5 kg)  08/16/19 146 lb (66.2 kg)  08/02/19 146 lb (66.2 kg)  GEN:  Well nourished, well developed in no acute distress HEENT: Normal NECK: No JVD; No carotid bruits LYMPHATICS: No lymphadenopathy CARDIAC: RRR, 2/6 SM, rubs, gallops RESPIRATORY:  Clear to auscultation without rales, wheezing or rhonchi  ABDOMEN: Soft, non-tender, non-distended MUSCULOSKELETAL:  No edema; No deformity  SKIN: Warm and dry, varicose NEUROLOGIC:  Alert and oriented x 3 PSYCHIATRIC:  Normal affect   ASSESSMENT:    1. Atherosclerosis of native coronary artery of native heart without angina pectoris   2. Essential hypertension    PLAN:    In order of problems listed above:  Coronary artery disease -DES to RCA doing very well. Minimal throat tightness which may be anginal equivalent however this is rare. Continue with aspirin atorvastatin high intensity at 40 mg.  Hyperlipidemia -Atorvastatin 40 mg once a day, LDL 96 at last check from outside office. Optimally goal would be less than 70.  Essential hypertension -Well-controlled today. Continue with amlodipine 5 mg and lisinopril 40 mg.  Heart murmur -Likely from aortic sclerosis but no significant stenosis.  Varicose veins -She commented on the discoloration of her lower extremities.  Discussed that this was from venous insufficiency/varicosities.  No evidence of arterial disease.  Lower extremity edema improved.   Shared Decision Making/Informed Consent      Medication Adjustments/Labs and Tests Ordered: Current medicines are reviewed at length with the patient today.  Concerns regarding medicines are outlined above.   Orders Placed This Encounter  Procedures   EKG 12-Lead   No orders of the defined types were placed in this encounter.   Patient Instructions  Medication Instructions:  Your physician recommends that you continue on your current medications as directed. Please refer to the Current Medication list given to you today.  *If you need a refill on your cardiac medications before your next appointment, please call your pharmacy*   Lab Work: None If you have labs (blood work) drawn today and your tests are completely normal, you will receive your results only by:  [place] (if you have MyChart) OR  A paper copy in the mail If you have any lab test that is abnormal or we need to change your treatment, we will call you to review the results.   Testing/Procedures: None   Follow-Up: At [facility], you and your health needs are our priority.  As part of our continuing mission to provide you with exceptional heart care, we have created designated Provider Care Teams.  These Care Teams include your primary Cardiologist (physician) and Advanced Practice Providers (APPs -  Physician Assistants and Nurse Practitioners) who all work together to provide you with the care you need, when you need it.  We recommend signing up for the patient portal called "MyChart".  Sign up information is provided on this After Visit Summary.  MyChart is used to connect with patients for Virtual Visits (Telemedicine).  Patients are able to view lab/test results, encounter notes, upcoming appointments, etc.  Non-urgent messages can be sent to your provider as well.   To learn more about what you can do with MyChart, go to [email or web address].    Your next appointment:   1 year(s)  The format for your next appointment:   In Person  Provider:   You may see Andrea Furbish, MD or one of the following Advanced Practice Providers on your designated Care Team:    Truitt Merle, NP  Cecilie Kicks,  NP  Kathyrn Drown, NP    Other Instructions  Signed, Andrea Furbish, MD  01/23/2020 9:50 AM    [facility]

## 2020-01-23 NOTE — Patient Instructions (Signed)
Medication Instructions:  Your physician recommends that you continue on your current medications as directed. Please refer to the Current Medication list given to you today.  *If you need a refill on your cardiac medications before your next appointment, please call your pharmacy*   Lab Work: None If you have labs (blood work) drawn today and your tests are completely normal, you will receive your results only by: [place] MyChart Message (if you have MyChart) OR . A paper copy in the mail If you have any lab test that is abnormal or we need to change your treatment, we will call you to review the results.   Testing/Procedures: None   Follow-Up: At [facility], you and your health needs are our priority.  As part of our continuing mission to provide you with exceptional heart care, we have created designated Provider Care Teams.  These Care Teams include your primary Cardiologist (physician) and Advanced Practice Providers (APPs -  Physician Assistants and Nurse Practitioners) who all work together to provide you with the care you need, when you need it.  We recommend signing up for the patient portal called "MyChart".  Sign up information is provided on this After Visit Summary.  MyChart is used to connect with patients for Virtual Visits (Telemedicine).  Patients are able to view lab/test results, encounter notes, upcoming appointments, etc.  Non-urgent messages can be sent to your provider as well.   To learn more about what you can do with MyChart, go to [email or web address].    Your next appointment:   1 year(s)  The format for your next appointment:   In Person  Provider:   You may see Candee Furbish, MD or one of the following Advanced Practice Providers on your designated Care Team:    Truitt Merle, NP  Cecilie Kicks, NP  Kathyrn Drown, NP    Other Instructions

## 2020-02-13 DIAGNOSIS — M170 Bilateral primary osteoarthritis of knee: Secondary | ICD-10-CM | POA: Diagnosis not present

## 2020-04-10 DIAGNOSIS — M859 Disorder of bone density and structure, unspecified: Secondary | ICD-10-CM | POA: Diagnosis not present

## 2020-04-10 DIAGNOSIS — E785 Hyperlipidemia, unspecified: Secondary | ICD-10-CM | POA: Diagnosis not present

## 2020-04-17 DIAGNOSIS — Z9861 Coronary angioplasty status: Secondary | ICD-10-CM | POA: Diagnosis not present

## 2020-04-17 DIAGNOSIS — Z0000 Encounter for general adult medical examination without abnormal findings: Secondary | ICD-10-CM | POA: Diagnosis not present

## 2020-04-17 DIAGNOSIS — E785 Hyperlipidemia, unspecified: Secondary | ICD-10-CM | POA: Diagnosis not present

## 2020-04-17 DIAGNOSIS — K219 Gastro-esophageal reflux disease without esophagitis: Secondary | ICD-10-CM | POA: Diagnosis not present

## 2020-04-17 DIAGNOSIS — M8580 Other specified disorders of bone density and structure, unspecified site: Secondary | ICD-10-CM | POA: Diagnosis not present

## 2020-04-17 DIAGNOSIS — I2510 Atherosclerotic heart disease of native coronary artery without angina pectoris: Secondary | ICD-10-CM | POA: Diagnosis not present

## 2020-04-17 DIAGNOSIS — N1831 Chronic kidney disease, stage 3a: Secondary | ICD-10-CM | POA: Diagnosis not present

## 2020-04-17 DIAGNOSIS — K5909 Other constipation: Secondary | ICD-10-CM | POA: Diagnosis not present

## 2020-04-17 DIAGNOSIS — E039 Hypothyroidism, unspecified: Secondary | ICD-10-CM | POA: Diagnosis not present

## 2020-04-17 DIAGNOSIS — I10 Essential (primary) hypertension: Secondary | ICD-10-CM | POA: Diagnosis not present

## 2020-05-21 DIAGNOSIS — M170 Bilateral primary osteoarthritis of knee: Secondary | ICD-10-CM | POA: Diagnosis not present

## 2020-05-28 DIAGNOSIS — M170 Bilateral primary osteoarthritis of knee: Secondary | ICD-10-CM | POA: Diagnosis not present

## 2020-06-04 DIAGNOSIS — M170 Bilateral primary osteoarthritis of knee: Secondary | ICD-10-CM | POA: Diagnosis not present

## 2020-06-11 DIAGNOSIS — M170 Bilateral primary osteoarthritis of knee: Secondary | ICD-10-CM | POA: Diagnosis not present

## 2020-06-18 DIAGNOSIS — M170 Bilateral primary osteoarthritis of knee: Secondary | ICD-10-CM | POA: Diagnosis not present

## 2020-07-09 DIAGNOSIS — E039 Hypothyroidism, unspecified: Secondary | ICD-10-CM | POA: Diagnosis not present

## 2020-07-12 ENCOUNTER — Emergency Department (HOSPITAL_BASED_OUTPATIENT_CLINIC_OR_DEPARTMENT_OTHER)
Admission: EM | Admit: 2020-07-12 | Discharge: 2020-07-12 | Disposition: A | Discharge status: Home / Self Care | Payer: Medicare Other | Attending: Emergency Medicine | Admitting: Emergency Medicine

## 2020-07-12 ENCOUNTER — Encounter (HOSPITAL_BASED_OUTPATIENT_CLINIC_OR_DEPARTMENT_OTHER): Payer: Self-pay

## 2020-07-12 ENCOUNTER — Emergency Department (HOSPITAL_BASED_OUTPATIENT_CLINIC_OR_DEPARTMENT_OTHER): Payer: Medicare Other

## 2020-07-12 ENCOUNTER — Other Ambulatory Visit: Payer: Self-pay

## 2020-07-12 DIAGNOSIS — Z7982 Long term (current) use of aspirin: Secondary | ICD-10-CM | POA: Insufficient documentation

## 2020-07-12 DIAGNOSIS — I2510 Atherosclerotic heart disease of native coronary artery without angina pectoris: Secondary | ICD-10-CM | POA: Diagnosis not present

## 2020-07-12 DIAGNOSIS — S0101XA Laceration without foreign body of scalp, initial encounter: Secondary | ICD-10-CM

## 2020-07-12 DIAGNOSIS — Z859 Personal history of malignant neoplasm, unspecified: Secondary | ICD-10-CM | POA: Insufficient documentation

## 2020-07-12 DIAGNOSIS — Z79899 Other long term (current) drug therapy: Secondary | ICD-10-CM | POA: Diagnosis not present

## 2020-07-12 DIAGNOSIS — S0990XA Unspecified injury of head, initial encounter: Secondary | ICD-10-CM | POA: Diagnosis present

## 2020-07-12 DIAGNOSIS — I10 Essential (primary) hypertension: Secondary | ICD-10-CM | POA: Insufficient documentation

## 2020-07-12 DIAGNOSIS — W19XXXA Unspecified fall, initial encounter: Secondary | ICD-10-CM

## 2020-07-12 DIAGNOSIS — Y92009 Unspecified place in unspecified non-institutional (private) residence as the place of occurrence of the external cause: Secondary | ICD-10-CM | POA: Insufficient documentation

## 2020-07-12 DIAGNOSIS — W01198A Fall on same level from slipping, tripping and stumbling with subsequent striking against other object, initial encounter: Secondary | ICD-10-CM | POA: Diagnosis not present

## 2020-07-12 DIAGNOSIS — Z87891 Personal history of nicotine dependence: Secondary | ICD-10-CM | POA: Diagnosis not present

## 2020-07-12 DIAGNOSIS — Y9301 Activity, walking, marching and hiking: Secondary | ICD-10-CM | POA: Diagnosis not present

## 2020-07-12 NOTE — ED Provider Notes (Signed)
[facility] EMERGENCY DEPARTMENT Provider Note   CSN: [id number] Arrival date & time: 07/12/20  1137     History Chief Complaint  Patient presents with  . Fall  . Laceration    [name] is a 87 y.o. female with past medical history of HTN, HLD, and CAD on aspirin daily who presents the ED after sustaining mechanical fall.  Patient reportedly was carrying groceries into the house when she lost her balance and fell, striking the back of her head on a table.  She does not take any anticoagulation aside from her aspirin.  She denied any loss of consciousness.  She states that the reason she fell is because she simply was hurrying too fast and she adamantly denies any presyncopal prodrome.  She is alert and oriented on my exam.  She is answering questions appropriately.  She denies any confusion, nausea, vomiting, numbness or weakness, blurred vision, diplopia, or any other symptoms.  She also adamantly denies any neck pain is able to demonstrate good range of motion.  She lives alone, but plans to spend the night with her daughter for observation.  HPI     Past Medical History:  Diagnosis Date  . CAD (coronary artery disease)   . DUB (dysfunctional uterine bleeding)   . Elevated cholesterol   . GERD (gastroesophageal reflux disease)   . Hypertension   . Plantar fasciitis   . Shingles     Patient Active Problem List   Diagnosis Date Noted  . Left knee pain 04/28/2015  . Atherosclerosis of native coronary artery of native heart without angina pectoris 11/22/2013  . Essential hypertension 11/22/2013  . Pure hypercholesterolemia 11/22/2013  . Inversion, nipple 09/05/2013  . Left ankle injury 02/14/2013  . Right foot pain 06/08/2011  . Shingles   . Elevated cholesterol   . Cancer ([place])   . Right knee pain 05/10/2011    Past Surgical History:  Procedure Laterality Date  . APPENDECTOMY  2011  . CARDIAC STINT IMPLANT  2008  . CARPAL TUNNEL RELEASE    . EYE  SURGERY    . INGUINAL HERNIA REPAIR    . OOPHORECTOMY  2001   LAVH,BSO  . ULNA NERVE RELEASE  2009  . VAGINAL HYSTERECTOMY  2001   LAVH, BSO     OB History    Gravida  2   Para  2   Term  2   Preterm      AB  0   Living  2     SAB      IAB      Ectopic      Multiple      Live Births              Family History  Problem Relation Age of Onset  . Heart attack Mother   . Hypertension Mother   . Heart attack Father   . CAD Father   . Heart disease Brother   . Hypertension Brother   . Diabetes Brother   . COPD Brother   . Asthma Brother   . Breast cancer Neg Hx     Social History   Tobacco Use  . Smoking status: Former Research scientist (life sciences)  . Smokeless tobacco: Never Used  Vaping Use  . Vaping Use: Never used  Substance Use Topics  . Alcohol use: No    Alcohol/week: 0.0 standard drinks  . Drug use: No    Home Medications Prior to Admission medications   Medication  Sig Start Date End Date Taking? Authorizing Provider  amLODipine (NORVASC) 5 MG tablet Take 1 tablet (5 mg total) by mouth daily. 07/14/18 01/23/20  Burtis Junes, NP  Ascorbic Acid (VITAMIN C) 1000 MG tablet Take 1,000 mg by mouth daily.    [provider]  aspirin 81 MG tablet Take 81 mg by mouth daily.      [provider]  atorvastatin (LIPITOR) 40 MG tablet Take 40 mg by mouth daily.  12/04/14   [provider]  cetirizine (ZYRTEC) 10 MG tablet Take 10 mg by mouth daily.      [provider]  Cholecalciferol (VITAMIN D) 2000 units CAPS Take 2,000 Units by mouth daily.    [provider]  clorazepate (TRANXENE) 7.5 MG tablet Take 7.5 mg by mouth once as needed. For nerves    [provider]  Coenzyme Q10 (CO Q 10 PO) Take 1 tablet by mouth every morning.    [provider]  glucosamine-chondroitin 500-400 MG tablet Take 1 tablet by mouth 2 (two) times daily.     [provider]  lisinopril (PRINIVIL,ZESTRIL) 40 MG tablet Take  40 mg by mouth daily.    [provider]  nitroGLYCERIN (NITROSTAT) 0.4 MG SL tablet Place 1 tablet (0.4 mg total) under the tongue every 5 (five) minutes as needed for chest pain. 05/30/18 01/23/20  Burtis Junes, NP  NON FORMULARY [facility]  ANTI-FUNGAL (NAIL)-#1    [provider]  pantoprazole (PROTONIX) 40 MG tablet Take 40 mg by mouth daily.  09/03/13   [provider]  Probiotic Product (ALIGN PO) Take by mouth.    [provider]    Allergies    Sulfa antibiotics  Review of Systems   Review of Systems  All other systems reviewed and are negative.   Physical Exam Updated Vital Signs BP (!) 160/75 (BP Location: Left Arm)   Pulse 82   Temp 97.9 F (36.6 C) (Oral)   Resp 20   Ht 5\' 4"  (1.626 m)   Wt 68 kg   LMP  (LMP Unknown)   SpO2 98%   BMI 25.75 kg/m   Physical Exam Vitals and nursing note reviewed. Exam conducted with a chaperone present.  Constitutional:      Appearance: Normal appearance.  HENT:     Head: Normocephalic.     Comments: No palpable skull defects.  3 cm linear, horizontal laceration in occipital region of scalp.  Relatively deep, poorly approximated.  Will need repair.  Bleeding well controlled. Eyes:     General: No scleral icterus.    Conjunctiva/sclera: Conjunctivae normal.  Neck:     Comments: No midline spinal cervical tenderness to palpation.  ROM fully intact. Cardiovascular:     Rate and Rhythm: Normal rate.  Pulmonary:     Effort: Pulmonary effort is normal.  Musculoskeletal:        General: Normal range of motion.     Cervical back: Normal range of motion and neck supple. No rigidity or tenderness.  Skin:    General: Skin is dry.  Neurological:     General: No focal deficit present.     Mental Status: She is alert and oriented to person, place, and time.     GCS: GCS eye subscore is 4. GCS verbal subscore is 5. GCS motor subscore is 6.     Cranial Nerves: No cranial nerve deficit.      Sensory: No sensory deficit.  Motor: No weakness.     Coordination: Coordination normal.  Psychiatric:        Mood and Affect: Mood normal.        Behavior: Behavior normal.        Thought Content: Thought content normal.      ED Results / Procedures / Treatments   Labs (all labs ordered are listed, but only abnormal results are displayed) Labs Reviewed - No data to display  EKG None  Radiology CT Head Wo Contrast  Result Date: 07/12/2020 CLINICAL DATA:  [place], hit back of head, laceration EXAM: CT HEAD WITHOUT CONTRAST TECHNIQUE: Contiguous axial images were obtained from the base of the skull through the vertex without intravenous contrast. COMPARISON:  None. FINDINGS: Brain: No acute infarct or hemorrhage. Lateral ventricles and midline structures are unremarkable. No acute extra-axial fluid collections. No mass effect. Vascular: No hyperdense vessel or unexpected calcification. Skull: Laceration and skin staples within the posterior scalp. No underlying fracture. The remainder of the calvarium is unremarkable. Sinuses/Orbits: No acute finding. Other: None. IMPRESSION: 1. No acute intracranial process. Electronically Signed   By: Randa Ngo M.D.   On: 07/12/2020 15:52    Procedures Procedures   Medications Ordered in ED Medications - No data to display  ED Course  I have reviewed the triage vital signs and the nursing notes.  Pertinent labs & imaging results that were available during my care of the patient were reviewed by me and considered in my medical decision making (see chart for details).    MDM Rules/Calculators/A&P                          Lashaun Poch was evaluated in Emergency Department on 07/12/2020 for the symptoms described in the history of present illness. She was evaluated in the context of the global COVID-19 pandemic, which necessitated consideration that the patient might be at risk for infection with the SARS-CoV-2 virus that causes  COVID-19. Institutional protocols and algorithms that pertain to the evaluation of patients at risk for COVID-19 are in a state of rapid change based on information released by regulatory bodies including the CDC and federal and state organizations. These policies and algorithms were followed during the patient's care in the ED.  I personally reviewed patient's medical chart and all notes from triage and staff during today's encounter. I have also ordered and reviewed all labs and imaging that I felt to be medically necessary in the evaluation of this patient's complaints and with consideration of their physical exam. If needed, translation services were available and utilized.   Patient with occipital scalp laceration subsequent to mechanical fall.  While she is neurovascularly intact, given age and posterior head injury, will proceed with CT head without contrast to assess for intracranial injury.  I discussed adding a CT cervical spine, but patient declined.  She has no midline tenderness and her ROM is fully intact.  I feel as though this is not unreasonable.    Closed scalp laceration with 3 staples.  No anesthesia required.  We will proceed with CT head without contrast for evaluation of possible intracranial hemorrhage or calvarial injury.  CT is negative.  Will discharge home.  Discussed concussion and return precautions.  Patient voices understanding is agreeable to the plan.  Final Clinical Impression(s) / ED Diagnoses Final diagnoses:  Fall, initial encounter  Scalp laceration, initial encounter    Rx / DC Orders ED Discharge Orders  None       Corena Herter, PA-C 07/12/20 [place], [place], DO 07/13/20 [phone]

## 2020-07-12 NOTE — ED Triage Notes (Signed)
Pt was carrying groceries in the house and was walking fast and lost her balance and fell and hit her head on a table. Laceration to posterior head noted. No blood thinners, denies LOC.

## 2020-07-12 NOTE — ED Notes (Signed)
States she tripped going into her home today, no dizziness, chest pain prior to fall. States she hit the back of her head on a piece of furniture, a laceration, approx 1cm in length noted at occipital area, bleeding is controlled. Area being cleaned by EMT. Pt denies any LOC during episode, states she takes 81mg  baby ASA q day.

## 2020-07-12 NOTE — ED Notes (Signed)
MAE x 4, alert and oriented, has strong equal grips. Voices no complaints at this time.

## 2020-07-12 NOTE — Discharge Instructions (Signed)
Your scalp was repaired using 3 staples.  They will need to be removed in 7 to 10 days by your primary care provider.  CT obtained of your head was without any acute injury.  We deferred CT imaging of your cervical spine given lack of any neck pain or injury.

## 2020-07-21 DIAGNOSIS — S0990XA Unspecified injury of head, initial encounter: Secondary | ICD-10-CM | POA: Diagnosis not present

## 2020-07-21 DIAGNOSIS — S0101XA Laceration without foreign body of scalp, initial encounter: Secondary | ICD-10-CM | POA: Diagnosis not present

## 2020-07-29 ENCOUNTER — Encounter: Payer: Self-pay | Admitting: Cardiology

## 2020-07-29 NOTE — Telephone Encounter (Signed)
error 

## 2020-08-28 ENCOUNTER — Ambulatory Visit: Payer: Medicare Other | Admitting: Family

## 2020-10-30 ENCOUNTER — Other Ambulatory Visit (HOSPITAL_BASED_OUTPATIENT_CLINIC_OR_DEPARTMENT_OTHER): Payer: Self-pay | Admitting: Internal Medicine

## 2020-10-30 DIAGNOSIS — Z1231 Encounter for screening mammogram for malignant neoplasm of breast: Secondary | ICD-10-CM

## 2020-11-18 DIAGNOSIS — H04123 Dry eye syndrome of bilateral lacrimal glands: Secondary | ICD-10-CM | POA: Diagnosis not present

## 2020-11-18 DIAGNOSIS — Z961 Presence of intraocular lens: Secondary | ICD-10-CM | POA: Diagnosis not present

## 2020-11-18 DIAGNOSIS — H35373 Puckering of macula, bilateral: Secondary | ICD-10-CM | POA: Diagnosis not present

## 2020-11-18 DIAGNOSIS — H5213 Myopia, bilateral: Secondary | ICD-10-CM | POA: Diagnosis not present

## 2020-12-01 ENCOUNTER — Ambulatory Visit (HOSPITAL_BASED_OUTPATIENT_CLINIC_OR_DEPARTMENT_OTHER)
Admission: RE | Admit: 2020-12-01 | Discharge: 2020-12-01 | Disposition: A | Discharge status: Home / Self Care | Payer: Medicare Other | Source: Ambulatory Visit | Attending: Internal Medicine | Admitting: Internal Medicine

## 2020-12-01 ENCOUNTER — Encounter (HOSPITAL_BASED_OUTPATIENT_CLINIC_OR_DEPARTMENT_OTHER): Payer: Self-pay

## 2020-12-01 ENCOUNTER — Other Ambulatory Visit: Payer: Self-pay

## 2020-12-01 DIAGNOSIS — Z1231 Encounter for screening mammogram for malignant neoplasm of breast: Secondary | ICD-10-CM | POA: Insufficient documentation

## 2020-12-02 ENCOUNTER — Ambulatory Visit (INDEPENDENT_AMBULATORY_CARE_PROVIDER_SITE_OTHER): Payer: Medicare Other | Admitting: Physician Assistant

## 2020-12-02 ENCOUNTER — Encounter: Payer: Self-pay | Admitting: Physician Assistant

## 2020-12-02 VITALS — BP 130/58 | HR 61 | Ht 64.0 in | Wt 150.0 lb

## 2020-12-02 DIAGNOSIS — E7800 Pure hypercholesterolemia, unspecified: Secondary | ICD-10-CM | POA: Diagnosis not present

## 2020-12-02 DIAGNOSIS — I25119 Atherosclerotic heart disease of native coronary artery with unspecified angina pectoris: Secondary | ICD-10-CM | POA: Diagnosis not present

## 2020-12-02 DIAGNOSIS — I10 Essential (primary) hypertension: Secondary | ICD-10-CM | POA: Diagnosis not present

## 2020-12-02 DIAGNOSIS — R0602 Shortness of breath: Secondary | ICD-10-CM | POA: Diagnosis not present

## 2020-12-02 DIAGNOSIS — R609 Edema, unspecified: Secondary | ICD-10-CM | POA: Diagnosis not present

## 2020-12-02 MED ORDER — ISOSORBIDE MONONITRATE ER 30 MG PO TB24: 15.0000 mg | ORAL_TABLET | Freq: Every day | ORAL | 3 refills | Status: DC

## 2020-12-02 NOTE — Progress Notes (Signed)
Cardiology Office Note:    Date:  12/02/2020   ID:  [name], DOB 1926/11/26, MRN [id number]  PCP:  Ginger Organ., MD  [facility] Providers Cardiologist:  Candee Furbish, MD    Referring MD: Ginger Organ., MD   Chief Complaint:  Chest Pain and Shortness of Breath    Patient Profile:   [name] is a 87 y.o. female with:  Coronary artery disease  S/p DES to pRCA and POBA (cutting balloon) to dRCA in 10/2006 Myoview in 2009 low risk Rare angina (throat tightness) Hypertension  Hyperlipidemia  GERD   Prior CV studies: Echocardiogram 05/30/2018 EF 60-65, GRII DD, GLS -18.4, normal RVSF, moderate MAC, moderate AV calcification, trivial AI  Cardiac catheterization 11/11/2006 RCA proximal 50, 99, distal 70-80 PCI: DES to the proximal RCA, POBA to the distal RCA  History of Present Illness: [name] was last seen by Dr. Marlou Porch in 11/21.  She returns for the evaluation of shortness of breath and chest pain.  She has been having symptoms for the past few months.  She has not had any worsening symptoms over that time.  She notes shortness of breath with certain activities outdoors.  She usually does not have symptoms if she does activities inside.  Symptoms usually come on with going up a hill or walking fast.  She notes shortness of breath with associated chest tightness.  She does not have throat discomfort.  Her symptoms or not completely like what she had prior to her PCI.  She has not had any orthopnea, syncope, leg edema.    Past Medical History:  Diagnosis Date   Aortic valve sclerosis    CAD (coronary artery disease)    S/p DES to the proximal RCA, POBA to the distal RCA 8/08 //Myoview 2009 low risk   DUB (dysfunctional uterine bleeding)    Elevated cholesterol    Atorvastatin   GERD (gastroesophageal reflux disease)    History of echocardiogram    EF 60-65, GRII DD, GLS -18.4, normal RVSF, moderate MAC, moderate AV calcification, trivial AI    Hypertension    Plantar fasciitis    Shingles    Current Medications: Current Meds  Medication Sig   amLODipine (NORVASC) 5 MG tablet Take 1 tablet (5 mg total) by mouth daily.   Ascorbic Acid (VITAMIN C) 1000 MG tablet Take 1,000 mg by mouth daily.   aspirin 81 MG tablet Take 81 mg by mouth daily.     atorvastatin (LIPITOR) 40 MG tablet Take 40 mg by mouth daily.    cetirizine (ZYRTEC) 10 MG tablet Take 10 mg by mouth daily.     Cholecalciferol (VITAMIN D) 2000 units CAPS Take 2,000 Units by mouth daily.   clorazepate (TRANXENE) 7.5 MG tablet Take 7.5 mg by mouth once as needed. For nerves   Coenzyme Q10 (CO Q 10 PO) Take 1 tablet by mouth every morning.   glucosamine-chondroitin 500-400 MG tablet Take 1 tablet by mouth 2 (two) times daily.    isosorbide mononitrate (IMDUR) 30 MG 24 hr tablet Take 0.5 tablets (15 mg total) by mouth daily.   lisinopril (PRINIVIL,ZESTRIL) 40 MG tablet Take 40 mg by mouth daily.   pantoprazole (PROTONIX) 40 MG tablet Take 40 mg by mouth daily.    SYNTHROID 50 MCG tablet Take 50 mcg by mouth every morning.    Allergies:   Sulfa antibiotics   Social History   Tobacco Use   Smoking status: Former  Smokeless tobacco: Never  Vaping Use   Vaping Use: Never used  Substance Use Topics   Alcohol use: No    Alcohol/week: 0.0 standard drinks   Drug use: No    Family Hx: The patient's family history includes Asthma in her brother; CAD in her father; COPD in her brother; Diabetes in her brother; Heart attack in her father and mother; Heart disease in her brother; Hypertension in her brother and mother. There is no history of Breast cancer.  Review of Systems  Constitutional: Negative for fever.  Respiratory:  Negative for cough and wheezing.   Gastrointestinal:  Negative for diarrhea, hematochezia, melena and vomiting.  Genitourinary:  Negative for hematuria.    EKGs/Labs/Other Test Reviewed:    EKG:  EKG is  ordered today.  The ekg ordered today  demonstrates NSR, HR 61, normal axis, increased artifact, no ST-T wave changes, QTC 426, similar to prior tracing  Recent Labs: No results found for requested labs within last 8760 hours.   Recent Lipid Panel No results found for: CHOL, TRIG, HDL, LDLCALC, LDLDIRECT  Labs obtained through [facility] - personally reviewed and interpreted: 04/10/2020: Total cholesterol 154, HDL 79, LDL 66, triglycerides 38, creatinine 0.94, K+ on 4.9, ALT 14   Risk Assessment/Calculations:          Physical Exam:    VS:  BP (!) 130/58   Pulse 61   Ht _0  (1.626 m)   Wt 150 lb (68 kg)   LMP  (LMP Unknown)   SpO2 97%   BMI 25.75 kg/m     Wt Readings from Last 3 Encounters:  12/02/20 150 lb (68 kg)  07/12/20 150 lb (68 kg)  01/23/20 151 lb (68.5 kg)    Constitutional:      Appearance: Healthy appearance. Not in distress.  Neck:     Vascular: JVD normal.  Pulmonary:     Effort: Pulmonary effort is normal.     Breath sounds: No wheezing. No rales.  Cardiovascular:     Normal rate. Regular rhythm. Normal S1. Normal S2.      Murmurs: There is a grade 2/6 systolic murmur at the URSB.  Edema:    Peripheral edema absent.  Abdominal:     Palpations: Abdomen is soft.  Skin:    General: Skin is warm and dry.  Neurological:     Mental Status: Alert and oriented to person, place and time.     Cranial Nerves: Cranial nerves are intact.       ASSESSMENT & PLAN:   1. Coronary artery disease involving native coronary artery of native heart with angina pectoris ([place]) 2. Shortness of breath She notes symptoms of exertional chest pain and shortness of breath of the past couple of months.  This is overall fairly stable.  Her symptoms sound fairly consistent with CCS class II angina.  She does not have symptoms completely reminiscent of her angina in 2008.  In any event, it has been a long time since she has been evaluated for ischemia.  Given her advanced age, hopefully we can avoid proceeding with  cardiac catheterization.  I have recommended proceeding with a Lexiscan Myoview to rule out ischemia.  I have also recommended adding isosorbide 15 mg daily.  Continue amlodipine 5 mg daily, aspirin 81 mg daily, atorvastatin 40 mg daily.  Follow-up with Dr. Marlou Porch in December as planned.  She will be brought back for sooner follow-up if her Myoview demonstrates high risk features or she has  worsening symptoms.  3. Essential hypertension Blood pressure is well controlled.  Continue amlodipine 5 mg daily, lisinopril 40 mg daily.  4. Pure hypercholesterolemia LDL at goal.  Continue atorvastatin 40 mg daily.    Shared Decision Making/Informed Consent The risks [chest pain, shortness of breath, cardiac arrhythmias, dizziness, blood pressure fluctuations, myocardial infarction, stroke/transient ischemic attack, nausea, vomiting, allergic reaction, radiation exposure, metallic taste sensation and life-threatening complications (estimated to be 1 in 10,000)], benefits (risk stratification, diagnosing coronary artery disease, treatment guidance) and alternatives of a nuclear stress test were discussed in detail with [name] and she agrees to proceed.   Dispo:  Return in 12 weeks (on 02/24/2021) for Routine Follow Up with Dr. Marlou Porch.   Medication Adjustments/Labs and Tests Ordered: Current medicines are reviewed at length with the patient today.  Concerns regarding medicines are outlined above.  Tests Ordered: Orders Placed This Encounter  Procedures   Basic metabolic panel   Pro b natriuretic peptide (BNP)   CBC   Cardiac Stress Test: Informed Consent Details: Physician/Practitioner Attestation; Transcribe to consent form and obtain patient signature   MYOCARDIAL PERFUSION IMAGING   EKG 12-Lead   Medication Changes: Meds ordered this encounter  Medications   isosorbide mononitrate (IMDUR) 30 MG 24 hr tablet    Sig: Take 0.5 tablets (15 mg total) by mouth daily.    Dispense:  45 tablet     Refill:  3   Signed, Richardson Dopp, PA-C  12/02/2020 3:46 PM    [facility] [address], [place], [place]  [address] Phone: [phone]; Fax: [phone]

## 2020-12-02 NOTE — Patient Instructions (Addendum)
Medication Instructions:  Your physician has recommended you make the following change in your medication:    START Imdur 30 mg taking only 1/2 tablet daily    *If you need a refill on your cardiac medications before your next appointment, please call your pharmacy*   Lab Work: TODAY:  BMET, CBC, & PRO BNP  If you have labs (blood work) drawn today and your tests are completely normal, you will receive your results only by: [place] (if you have MyChart) OR A paper copy in the mail If you have any lab test that is abnormal or we need to change your treatment, we will call you to review the results.   Testing/Procedures: Your physician has requested that you have a lexiscan myoview. For further information please visit [email or web address]. Please follow instruction sheet,  BELOW:    You are scheduled for a Myocardial Perfusion Imaging Study  Please arrive 15 minutes prior to your appointment time for registration and insurance purposes.  The test will take approximately 3 to 4 hours to complete; you may bring reading material.  If someone comes with you to your appointment, they will need to remain in the main lobby due to limited space in the testing area. **If you are pregnant or breastfeeding, please notify the nuclear lab prior to your appointment**  How to prepare for your Myocardial Perfusion Test: Do not eat or drink 3 hours prior to your test, except you may have water. Do not consume products containing caffeine (regular or decaffeinated) 12 hours prior to your test. (ex: coffee, chocolate, sodas, tea). Do bring a list of your current medications with you.  If not listed below, you may take your medications as normal. Do wear comfortable clothes (no dresses or overalls) and walking shoes, tennis shoes preferred (No heels or open toe shoes are allowed). Do NOT wear cologne, perfume, aftershave, or lotions (deodorant is allowed). If these instructions are not followed,  your test will have to be rescheduled.     Follow-Up: At [facility], you and your health needs are our priority.  As part of our continuing mission to provide you with exceptional heart care, we have created designated Provider Care Teams.  These Care Teams include your primary Cardiologist (physician) and Advanced Practice Providers (APPs -  Physician Assistants and Nurse Practitioners) who all work together to provide you with the care you need, when you need it.  We recommend signing up for the patient portal called "MyChart".  Sign up information is provided on this After Visit Summary.  MyChart is used to connect with patients for Virtual Visits (Telemedicine).  Patients are able to view lab/test results, encounter notes, upcoming appointments, etc.  Non-urgent messages can be sent to your provider as well.   To learn more about what you can do with MyChart, go to [email or web address].    Your next appointment:   3 month(s)  AS SCHEDULED UNLESS YOUR PAIN GETS WORSE OR DOESN'T GET BETTER, COME BACK SOONER    The format for your next appointment:   In Person  Provider:   Candee Furbish, MD   Other Instructions

## 2020-12-03 ENCOUNTER — Telehealth: Payer: Self-pay | Admitting: Cardiology

## 2020-12-03 DIAGNOSIS — E875 Hyperkalemia: Secondary | ICD-10-CM

## 2020-12-03 LAB — BASIC METABOLIC PANEL
BUN/Creatinine Ratio: 20 (ref 12–28)
BUN: 17 mg/dL (ref 10–36)
CO2: 22 mmol/L (ref 20–29)
Calcium: 9.3 mg/dL (ref 8.7–10.3)
Chloride: 99 mmol/L (ref 96–106)
Creatinine, Ser: 0.85 mg/dL (ref 0.57–1.00)
Glucose: 87 mg/dL (ref 65–99)
Potassium: 5.3 mmol/L — ABNORMAL HIGH (ref 3.5–5.2)
Sodium: 135 mmol/L (ref 134–144)
eGFR: 64 mL/min/{1.73_m2} (ref >59)

## 2020-12-03 LAB — CBC
Hematocrit: 36.9 % (ref 34.0–46.6)
Hemoglobin: 12.6 g/dL (ref 11.1–15.9)
MCH: 29.9 pg (ref 26.6–33.0)
MCHC: 34.1 g/dL (ref 31.5–35.7)
MCV: 88 fL (ref 79–97)
Platelets: 264 10*3/uL (ref 150–450)
RBC: 4.21 x10E6/uL (ref 3.77–5.28)
RDW: 14.2 % (ref 11.7–15.4)
WBC: 8.2 10*3/uL (ref 3.4–10.8)

## 2020-12-03 LAB — PRO B NATRIURETIC PEPTIDE: NT-Pro BNP: 112 pg/mL (ref 0–738)

## 2020-12-03 NOTE — Telephone Encounter (Signed)
Pt aware of lab results and will have repeat labs done next Wednesday ./cy

## 2020-12-03 NOTE — Telephone Encounter (Signed)
Patient is returning call to discuss her lab results. She states she is going to another appointment shortly and would prefer a call back after 3:00 PM if possible.

## 2020-12-03 NOTE — Telephone Encounter (Signed)
Creatinine, Hgb normal.  K+ mildly elevated.  BNP normal. PLAN:  -Reduce dietary potassium -If she is taking any OTC potassium supplement, discontinue -Repeat BMET 1-2 weeks -Continue current medications and follow-up as planned Richardson Dopp, PA-C    12/03/2020 10:08 AM

## 2020-12-04 ENCOUNTER — Telehealth (HOSPITAL_COMMUNITY): Payer: Self-pay | Admitting: *Deleted

## 2020-12-04 NOTE — Telephone Encounter (Signed)
Patient given detailed instructions per Myocardial Perfusion Study Information Sheet for the test on 12/10/20 Patient notified to arrive 15 minutes early and that it is imperative to arrive on time for appointment to keep from having the test rescheduled.  If you need to cancel or reschedule your appointment, please call the office within 24 hours of your appointment. . Patient verbalized understanding.  Kirstie Peri

## 2020-12-10 ENCOUNTER — Other Ambulatory Visit: Payer: Self-pay

## 2020-12-10 ENCOUNTER — Other Ambulatory Visit: Payer: Medicare Other | Admitting: *Deleted

## 2020-12-10 ENCOUNTER — Encounter: Payer: Self-pay | Admitting: Physician Assistant

## 2020-12-10 ENCOUNTER — Ambulatory Visit (HOSPITAL_COMMUNITY): Payer: Medicare Other | Attending: Internal Medicine

## 2020-12-10 DIAGNOSIS — I25119 Atherosclerotic heart disease of native coronary artery with unspecified angina pectoris: Secondary | ICD-10-CM | POA: Diagnosis not present

## 2020-12-10 DIAGNOSIS — E875 Hyperkalemia: Secondary | ICD-10-CM

## 2020-12-10 LAB — MYOCARDIAL PERFUSION IMAGING
LV dias vol: 22 mL (ref 46–106)
LV sys vol: 50 mL
Nuc Stress EF: 57 %
Peak HR: 93 {beats}/min
Rest HR: 64 {beats}/min
Rest Nuclear Isotope Dose: 10.3 mCi
SDS: 2
SRS: 10
SSS: 12
ST Depression (mm): 0 mm
Stress Nuclear Isotope Dose: 30.6 mCi
TID: 0.91

## 2020-12-10 LAB — BASIC METABOLIC PANEL
BUN/Creatinine Ratio: 31 — ABNORMAL HIGH (ref 12–28)
BUN: 27 mg/dL (ref 10–36)
CO2: 22 mmol/L (ref 20–29)
Calcium: 9.4 mg/dL (ref 8.7–10.3)
Chloride: 98 mmol/L (ref 96–106)
Creatinine, Ser: 0.87 mg/dL (ref 0.57–1.00)
Glucose: 83 mg/dL (ref 70–99)
Potassium: 5.1 mmol/L (ref 3.5–5.2)
Sodium: 134 mmol/L (ref 134–144)
eGFR: 62 mL/min/{1.73_m2} (ref >59)

## 2020-12-10 MED ORDER — TECHNETIUM TC 99M TETROFOSMIN IV KIT
10.3000 | PACK | Freq: Once | INTRAVENOUS | Status: AC | PRN
  Administered 2020-12-10: 10.3 via INTRAVENOUS
  Filled 2020-12-10: qty 11

## 2020-12-10 MED ORDER — REGADENOSON 0.4 MG/5ML IV SOLN
0.4000 mg | Freq: Once | INTRAVENOUS | Status: AC
  Administered 2020-12-10: 0.4 mg via INTRAVENOUS

## 2020-12-10 MED ORDER — TECHNETIUM TC 99M TETROFOSMIN IV KIT
30.6000 | PACK | Freq: Once | INTRAVENOUS | Status: AC | PRN
  Administered 2020-12-10: 30.6 via INTRAVENOUS
  Filled 2020-12-10: qty 31

## 2021-01-14 ENCOUNTER — Other Ambulatory Visit (HOSPITAL_BASED_OUTPATIENT_CLINIC_OR_DEPARTMENT_OTHER): Payer: Self-pay

## 2021-01-14 MED ORDER — INFLUENZA VAC A&B SA ADJ QUAD 0.5 ML IM PRSY
PREFILLED_SYRINGE | INTRAMUSCULAR | 0 refills | Status: DC
  Filled 2021-01-14: qty 0.5, 1d supply, fill #0

## 2021-02-23 NOTE — Progress Notes (Signed)
Cardiology Office Note:    Date:  02/24/2021   ID:  [name], DOB [birth year], MRN [id number]  PCP:  Andrea Organ., MD  [facility] Cardiologist:  Andrea Furbish, MD  [facility] Electrophysiologist:  None   Referring MD: Andrea Organ., MD     History of Present Illness:    [name] is a 87 y.o. female here for the follow-up of coronary artery disease with prior DES to proximal RCA in August 2008. In 2009 she had a stress test that showed no ischemia. Normal ejection fraction.  Occasionally she may have some throat discomfort with heavy exertional activity but this seems to be a rare occurrence.  Back in 2020, her husband, [name], also a patient of ours had died on hospice.   At her last visit, she was doing well and mentioned travelling to [facility] with her daughter for her 70th birthday. In particular, she enjoyed the magician acts.   She was seen in the ED 07/12/20 for a fall. She was carrying groceries into the house when she tripped and hit the back of her head on a table. She did not loss consciousness and was alert in the ED. The laceration on her scalp was closed with 3 staples. CT was negative for intracranial hemorrhage or calvarial injury and she was discharged.  Today, she has been doing well with no major concerns. Occasionally, she becomes short of breath. However, she can catch her breath after some rest.   She has gained some weight recently because she enjoys a pint of ice cream every night.  She denies any palpitations, chest pain, lightheadedness, headaches, syncope, orthopnea, PND, or lower extremity edema.  Past Medical History:  Diagnosis Date   Aortic valve sclerosis    CAD (coronary artery disease)    S/p DES to the proximal RCA, POBA to the distal RCA 8/08 //Myoview 2009 low risk // Myoview 9/22: EF 57, no ischemia or infarction, apical thinning artifact, low risk study   DUB (dysfunctional uterine bleeding)    Elevated  cholesterol    Atorvastatin   GERD (gastroesophageal reflux disease)    History of echocardiogram    EF 60-65, GRII DD, GLS -18.4, normal RVSF, moderate MAC, moderate AV calcification, trivial AI   Hypertension    Plantar fasciitis    Shingles     Past Surgical History:  Procedure Laterality Date   APPENDECTOMY  2011   CARDIAC STINT IMPLANT  2008   CARPAL TUNNEL RELEASE     EYE SURGERY     INGUINAL HERNIA REPAIR     OOPHORECTOMY  2001   LAVH,BSO   ULNA NERVE RELEASE  2009   VAGINAL HYSTERECTOMY  2001   LAVH, BSO    Current Medications: Current Meds  Medication Sig   amLODipine (NORVASC) 5 MG tablet Take 1 tablet (5 mg total) by mouth daily.   Ascorbic Acid (VITAMIN C) 1000 MG tablet Take 1,000 mg by mouth daily.   aspirin 81 MG tablet Take 81 mg by mouth daily.     atorvastatin (LIPITOR) 40 MG tablet Take 40 mg by mouth daily.    cetirizine (ZYRTEC) 10 MG tablet Take 10 mg by mouth daily.     Cholecalciferol (VITAMIN D) 2000 units CAPS Take 2,000 Units by mouth daily.   clorazepate (TRANXENE) 7.5 MG tablet Take 7.5 mg by mouth once as needed. For nerves   Coenzyme Q10 (CO Q 10 PO) Take 1 tablet by mouth  every morning.   glucosamine-chondroitin 500-400 MG tablet Take 1 tablet by mouth 2 (two) times daily.    influenza vaccine adjuvanted (FLUAD) 0.5 ML injection Inject into the muscle.   isosorbide mononitrate (IMDUR) 30 MG 24 hr tablet Take 0.5 tablets (15 mg total) by mouth daily.   lisinopril (PRINIVIL,ZESTRIL) 40 MG tablet Take 40 mg by mouth daily.   nitroGLYCERIN (NITROSTAT) 0.4 MG SL tablet Place 1 tablet (0.4 mg total) under the tongue every 5 (five) minutes as needed for chest pain.   pantoprazole (PROTONIX) 40 MG tablet Take 40 mg by mouth daily.    SYNTHROID 50 MCG tablet Take 50 mcg by mouth every morning.     Allergies:   Sulfa antibiotics   Social History   Socioeconomic History   Marital status: Widowed    Spouse name: Not on file   Number of children:  Not on file   Years of education: Not on file   Highest education level: Not on file  Occupational History   Not on file  Tobacco Use   Smoking status: Former   Smokeless tobacco: Never  Vaping Use   Vaping Use: Never used  Substance and Sexual Activity   Alcohol use: No    Alcohol/week: 0.0 standard drinks   Drug use: No   Sexual activity: Never    Birth control/protection: Post-menopausal, Surgical    Comment: INTERCOURSE AGE 25, SEXUAL PARTNERS LEWSS THAN 7  Other Topics Concern   Not on file  Social History Narrative   Not on file   Social Determinants of Health   Financial Resource Strain: Not on file  Food Insecurity: Not on file  Transportation Needs: Not on file  Physical Activity: Not on file  Stress: Not on file  Social Connections: Not on file     Family History: The patient's family history includes Asthma in her brother; CAD in her father; COPD in her brother; Diabetes in her brother; Heart attack in her father and mother; Heart disease in her brother; Hypertension in her brother and mother. There is no history of Breast cancer.  ROS:   Please see the history of present illness.   (+) Short of breath (+) Weight gain All other systems reviewed and are negative.  EKGs/Labs/Other Studies Reviewed:    The following studies were reviewed today: Lexiscan 12/10/20   The study is normal. The study is low risk.   No ST deviation was noted.   LV perfusion is normal. There is no evidence of ischemia. There is no evidence of infarction.   Left ventricular function is normal. Nuclear stress EF: 57 %. The left ventricular ejection fraction is normal (55-65%). End diastolic cavity size is normal.   Prior study available for comparison. Reduced counts in the apex with normal wall motion consistent with apical thinning artifact.  No evidence of ischemia or infarction.  Normal LVEF, 57%.  This is a low risk study.   CT Head 07/12/20 IMPRESSION: 1. No acute  intracranial process.  ECHO 05/30/2018  1. The left ventricle has normal systolic function with an ejection  fraction of 60-65%. The cavity size was normal. Left ventricular diastolic  Doppler parameters are consistent with pseudonormalization. Elevated left ventricular end-diastolic pressure.   2. Normal GLS -18.4.   3. The right ventricle has normal systolic function. The cavity was  normal. There is no increase in right ventricular wall thickness.   4. Moderate thickening of the mitral valve leaflet. Moderate  calcification of the mitral  valve leaflet. There is moderate mitral  annular calcification present.   5. The aortic valve is tricuspid Moderate thickening of the aortic valve  Moderate calcification of the aortic valve. Aortic valve regurgitation is  trivial by color flow Doppler.   EKG:  EKG was not ordered today 01/23/20: sinus rhythm 63 no other abnormalities  Recent Labs: 12/02/2020: Hemoglobin 12.6; NT-Pro BNP 112; Platelets 264 12/10/2020: BUN 27; Creatinine, Ser 0.87; Potassium 5.1; Sodium 134  Recent Lipid Panel No results found for: CHOL, TRIG, HDL, CHOLHDL, VLDL, LDLCALC, LDLDIRECT   Risk Assessment/Calculations:       Physical Exam:    VS:  BP (!) 112/50 (BP Location: Left Arm, Patient Position: Sitting, Cuff Size: Normal)   Pulse 64   Ht _0  (1.626 m)   Wt 161 lb (73 kg)   LMP  (LMP Unknown)   SpO2 94%   BMI 27.64 kg/m     Wt Readings from Last 3 Encounters:  02/24/21 161 lb (73 kg)  12/10/20 150 lb (68 kg)  12/02/20 150 lb (68 kg)     GEN:  Well nourished, well developed in no acute distress HEENT: Normal NECK: No JVD; No carotid bruits LYMPHATICS: No lymphadenopathy CARDIAC: RRR, 2/6 SM, no rubs, gallops RESPIRATORY:  Clear to auscultation without rales, wheezing or rhonchi  ABDOMEN: Soft, non-tender, non-distended MUSCULOSKELETAL:  No edema; Kyphosis  SKIN: Warm and dry, varicose NEUROLOGIC:  Alert and oriented x 3 PSYCHIATRIC:  Normal  affect   ASSESSMENT:    1. Atherosclerosis of native coronary artery of native heart without angina pectoris   2. Essential hypertension   3. Pure hypercholesterolemia   4. Aortic valve sclerosis   5. Asymptomatic varicose veins of both lower extremities     PLAN:    Atherosclerosis of native coronary artery of native heart without angina pectoris Prior DES to RCA doing well.  Stress test reassuring on 12/10/2020.  Isosorbide low-dose was discontinued.  She is feeling well.  Discussed that some of her shortness of breath could be deconditioning.  Continue with exercise efforts, walking.  Aspirin 81 mg a day.  Statin.  She did admit that she is eating quite a bit of ice cream on a nightly basis.  Her weight is up about 10 pounds since our last visit.  Asked her to watch this.  Essential hypertension Excellent control blood pressure.  Continue with lisinopril 40 mg a day.  No other changes.  Pure hypercholesterolemia Continue with atorvastatin 40 mg.  LDL 66.  Wonderful.  Aortic valve sclerosis Calcification noted on valve previously.  No stenosis.  Varicose veins of both lower extremities Previously commented on discoloration of lower extremities.  Venous insufficiency/varicosities.  No evidence of arterial disease.  Lower extremity edema had previously been improved.   Shared Decision Making/Informed Consent      Medication Adjustments/Labs and Tests Ordered: Current medicines are reviewed at length with the patient today.  Concerns regarding medicines are outlined above.  No orders of the defined types were placed in this encounter.  No orders of the defined types were placed in this encounter.   Patient Instructions  Medication Instructions:  The current medical regimen is effective;  continue present plan and medications.  *If you need a refill on your cardiac medications before your next appointment, please call your pharmacy*  Follow-Up: At [facility], you and  your health needs are our priority.  As part of our continuing mission to provide you with exceptional heart care, we  have created designated Provider Care Teams.  These Care Teams include your primary Cardiologist (physician) and Advanced Practice Providers (APPs -  Physician Assistants and Nurse Practitioners) who all work together to provide you with the care you need, when you need it.  We recommend signing up for the patient portal called "MyChart".  Sign up information is provided on this After Visit Summary.  MyChart is used to connect with patients for Virtual Visits (Telemedicine).  Patients are able to view lab/test results, encounter notes, upcoming appointments, etc.  Non-urgent messages can be sent to your provider as well.   To learn more about what you can do with MyChart, go to [email or web address].    Your next appointment:   6 month(s)  The format for your next appointment:   In Person  Provider:   With any PA/NP and in 1 year with Andrea Furbish, MD    Thank you for choosing [facility]!!     Wilhemina Bonito as a scribe for Andrea Furbish, MD.,have documented all relevant documentation on the behalf of Andrea Furbish, MD,as directed by  Andrea Furbish, MD while in the presence of Andrea Furbish, MD.  I, Andrea Furbish, MD, have reviewed all documentation for this visit. The documentation on 02/24/21 for the exam, diagnosis, procedures, and orders are all accurate and complete.   Signed, Andrea Furbish, MD  02/24/2021 10:08 AM    [facility]

## 2021-02-24 ENCOUNTER — Encounter: Payer: Self-pay | Admitting: Cardiology

## 2021-02-24 ENCOUNTER — Other Ambulatory Visit: Payer: Self-pay

## 2021-02-24 ENCOUNTER — Ambulatory Visit (INDEPENDENT_AMBULATORY_CARE_PROVIDER_SITE_OTHER): Payer: Medicare Other | Admitting: Cardiology

## 2021-02-24 DIAGNOSIS — I10 Essential (primary) hypertension: Secondary | ICD-10-CM

## 2021-02-24 DIAGNOSIS — E7800 Pure hypercholesterolemia, unspecified: Secondary | ICD-10-CM

## 2021-02-24 DIAGNOSIS — I2510 Atherosclerotic heart disease of native coronary artery without angina pectoris: Secondary | ICD-10-CM

## 2021-02-24 DIAGNOSIS — I358 Other nonrheumatic aortic valve disorders: Secondary | ICD-10-CM | POA: Insufficient documentation

## 2021-02-24 DIAGNOSIS — I8393 Asymptomatic varicose veins of bilateral lower extremities: Secondary | ICD-10-CM | POA: Diagnosis not present

## 2021-02-24 NOTE — Assessment & Plan Note (Signed)
Continue with atorvastatin 40 mg.  LDL 66.  Wonderful.

## 2021-02-24 NOTE — Assessment & Plan Note (Signed)
Excellent control blood pressure.  Continue with lisinopril 40 mg a day.  No other changes.

## 2021-02-24 NOTE — Assessment & Plan Note (Signed)
Previously commented on discoloration of lower extremities.  Venous insufficiency/varicosities.  No evidence of arterial disease.  Lower extremity edema had previously been improved.

## 2021-02-24 NOTE — Patient Instructions (Signed)
Medication Instructions:  The current medical regimen is effective;  continue present plan and medications.  *If you need a refill on your cardiac medications before your next appointment, please call your pharmacy*  Follow-Up: At [facility], you and your health needs are our priority.  As part of our continuing mission to provide you with exceptional heart care, we have created designated Provider Care Teams.  These Care Teams include your primary Cardiologist (physician) and Advanced Practice Providers (APPs -  Physician Assistants and Nurse Practitioners) who all work together to provide you with the care you need, when you need it.  We recommend signing up for the patient portal called "MyChart".  Sign up information is provided on this After Visit Summary.  MyChart is used to connect with patients for Virtual Visits (Telemedicine).  Patients are able to view lab/test results, encounter notes, upcoming appointments, etc.  Non-urgent messages can be sent to your provider as well.   To learn more about what you can do with MyChart, go to [email or web address].    Your next appointment:   6 month(s)  The format for your next appointment:   In Person  Provider:   With any PA/NP and in 1 year with Candee Furbish, MD    Thank you for choosing [facility]!!

## 2021-02-24 NOTE — Assessment & Plan Note (Signed)
Calcification noted on valve previously.  No stenosis.

## 2021-02-24 NOTE — Assessment & Plan Note (Addendum)
Prior DES to RCA doing well.  Stress test reassuring on 12/10/2020.  Isosorbide low-dose was discontinued.  She is feeling well.  Discussed that some of her shortness of breath could be deconditioning.  Continue with exercise efforts, walking.  Aspirin 81 mg a day.  Statin.  She did admit that she is eating quite a bit of ice cream on a nightly basis.  Her weight is up about 10 pounds since our last visit.  Asked her to watch this.

## 2021-03-25 ENCOUNTER — Ambulatory Visit: Payer: Self-pay

## 2021-03-25 ENCOUNTER — Ambulatory Visit (INDEPENDENT_AMBULATORY_CARE_PROVIDER_SITE_OTHER): Payer: Medicare HMO | Admitting: Orthopaedic Surgery

## 2021-03-25 ENCOUNTER — Encounter: Payer: Self-pay | Admitting: Orthopaedic Surgery

## 2021-03-25 ENCOUNTER — Other Ambulatory Visit: Payer: Self-pay

## 2021-03-25 ENCOUNTER — Ambulatory Visit (INDEPENDENT_AMBULATORY_CARE_PROVIDER_SITE_OTHER): Payer: Medicare HMO

## 2021-03-25 VITALS — Ht 64.0 in | Wt 161.0 lb

## 2021-03-25 DIAGNOSIS — M25562 Pain in left knee: Secondary | ICD-10-CM | POA: Diagnosis not present

## 2021-03-25 DIAGNOSIS — M25561 Pain in right knee: Secondary | ICD-10-CM | POA: Diagnosis not present

## 2021-03-25 DIAGNOSIS — G8929 Other chronic pain: Secondary | ICD-10-CM

## 2021-03-25 MED ORDER — MELOXICAM 15 MG PO TABS: 15.0000 mg | ORAL_TABLET | Freq: Every day | ORAL | 1 refills | Status: DC | PRN

## 2021-03-25 MED ORDER — METHYLPREDNISOLONE ACETATE 40 MG/ML IJ SUSP
40.0000 mg | INTRAMUSCULAR | Status: AC | PRN
  Administered 2021-03-25: 40 mg via INTRA_ARTICULAR

## 2021-03-25 MED ORDER — LIDOCAINE HCL 1 % IJ SOLN
3.0000 mL | INTRAMUSCULAR | Status: AC | PRN
Start: 2021-03-25 — End: 2021-03-25
  Administered 2021-03-25: 3 mL

## 2021-03-25 MED ORDER — LIDOCAINE HCL 1 % IJ SOLN
3.0000 mL | INTRAMUSCULAR | Status: AC | PRN
  Administered 2021-03-25: 3 mL

## 2021-03-25 NOTE — Progress Notes (Signed)
Office Visit Note   Patient: [name]           Date of Birth: [birth year]           MRN: [id number] Visit Date: 03/25/2021              Requested by: Ginger Organ., MD [address] [place],  [place] [address] PCP: Ginger Organ., MD   Assessment & Plan: Visit Diagnoses:  1. Chronic pain of right knee   2. Chronic pain of left knee     Plan: Fortunately her joint space is well-maintained.  I think she would best benefit from outpatient physical therapy to strengthen her knees and to work on her balance and coordination.  She agrees with this as well.  She did require steroid injections in both knees and I agree with this since she is not had these in about 9 months or more.  She did tolerate sterile injection of both knees today without difficulty.  I will see her back in about 6 weeks to see how she is doing overall.  I will send in some meloxicam to take occasionally for inflammation.  Follow-Up Instructions: Return in about 6 weeks (around 05/06/2021).   Orders:  Orders Placed This Encounter  Procedures   Large Joint Inj   Large Joint Inj   XR Knee 1-2 Views Left   XR Knee 1-2 Views Right   No orders of the defined types were placed in this encounter.     Procedures: Large Joint Inj: R knee on 03/25/2021 2:56 PM Indications: diagnostic evaluation and pain Details: 22 G 1.5 in needle, superolateral approach  Arthrogram: No  Medications: 3 mL lidocaine 1 %; 40 mg methylPREDNISolone acetate 40 MG/ML Outcome: tolerated well, no immediate complications Procedure, treatment alternatives, risks and benefits explained, specific risks discussed. Consent was given by the patient. Immediately prior to procedure a time out was called to verify the correct patient, procedure, equipment, support staff and site/side marked as required. Patient was prepped and draped in the usual sterile fashion.    Large Joint Inj: L knee on 03/25/2021 2:56 PM Indications:  diagnostic evaluation and pain Details: 22 G 1.5 in needle, superolateral approach  Arthrogram: No  Medications: 3 mL lidocaine 1 %; 40 mg methylPREDNISolone acetate 40 MG/ML Outcome: tolerated well, no immediate complications Procedure, treatment alternatives, risks and benefits explained, specific risks discussed. Consent was given by the patient. Immediately prior to procedure a time out was called to verify the correct patient, procedure, equipment, support staff and site/side marked as required. Patient was prepped and draped in the usual sterile fashion.      Clinical Data: No additional findings.   Subjective: Chief Complaint  Patient presents with   Right Knee - Follow-up   Left Knee - Follow-up  The patient is a very pleasant 88 year old female who comes in for evaluation treatment of bilateral knee pain.  She is a patient of Dr. Brigitte Pulse with [facility] and actually had seen Dr. Layne Benton before as well.  She has had steroid injections in both knees and hyaluronic acid injections.  She said the gel shots really made things worse and really did not help.  She does take occasional Tylenol arthritis and Aleve.  She is very active 88 year old female and does feel like her legs are weak and she has problems getting up from a seated position.  She says her right knee hurts worse than left and he  did crack and pop on occasion.  HPI  Review of Systems She currently denies any headache, chest pain, shortness of breath, fever, chills, nausea, vomiting  Objective: Vital Signs: Ht _0  (1.626 m)    Wt 161 lb (73 kg)    LMP  (LMP Unknown)    BMI 27.64 kg/m   Physical Exam She is alert and orient x3 and in no acute distress Ortho Exam Examination of both knees shows no effusion.  She has excellent range of motion of both knees with just a little bit of patellofemoral pain and crepitation with both knees. Specialty Comments:  No specialty comments  available.  Imaging: XR Knee 1-2 Views Left  Result Date: 03/25/2021 2 views of the left knee show patellofemoral arthritic changes.  The medial lateral compartments appear well-maintained and the alignment is neutral.  XR Knee 1-2 Views Right  Result Date: 03/25/2021 2 views of the right knee show no acute findings.  The medial lateral compartments are well-maintained but there is patellofemoral arthritic changes.    PMFS History: Patient Active Problem List   Diagnosis Date Noted   Aortic valve sclerosis 02/24/2021   Varicose veins of both lower extremities 02/24/2021   Left knee pain 04/28/2015   Atherosclerosis of native coronary artery of native heart without angina pectoris 11/22/2013   Essential hypertension 11/22/2013   Pure hypercholesterolemia 11/22/2013   Inversion, nipple 09/05/2013   Left ankle injury 02/14/2013   Right foot pain 06/08/2011   Shingles    Elevated cholesterol    Cancer ([place])    Right knee pain 05/10/2011   Past Medical History:  Diagnosis Date   Aortic valve sclerosis    CAD (coronary artery disease)    S/p DES to the proximal RCA, POBA to the distal RCA 8/08 //Myoview 2009 low risk // Myoview 9/22: EF 57, no ischemia or infarction, apical thinning artifact, low risk study   DUB (dysfunctional uterine bleeding)    Elevated cholesterol    Atorvastatin   GERD (gastroesophageal reflux disease)    History of echocardiogram    EF 60-65, GRII DD, GLS -18.4, normal RVSF, moderate MAC, moderate AV calcification, trivial AI   Hypertension    Plantar fasciitis    Shingles     Family History  Problem Relation Age of Onset   Heart attack Mother    Hypertension Mother    Heart attack Father    CAD Father    Heart disease Brother    Hypertension Brother    Diabetes Brother    COPD Brother    Asthma Brother    Breast cancer Neg Hx     Past Surgical History:  Procedure Laterality Date   APPENDECTOMY  2011   CARDIAC STINT IMPLANT  2008   CARPAL  TUNNEL RELEASE     EYE SURGERY     INGUINAL HERNIA REPAIR     OOPHORECTOMY  2001   [place] RELEASE  2009   VAGINAL HYSTERECTOMY  2001   LAVH, BSO   Social History   Occupational History   Not on file  Tobacco Use   Smoking status: Former   Smokeless tobacco: Never  Vaping Use   Vaping Use: Never used  Substance and Sexual Activity   Alcohol use: No    Alcohol/week: 0.0 standard drinks   Drug use: No   Sexual activity: Never    Birth control/protection: Post-menopausal, Surgical    Comment: INTERCOURSE AGE 65, SEXUAL PARTNERS LEWSS THAN 5

## 2021-04-06 ENCOUNTER — Ambulatory Visit: Payer: Medicare HMO | Attending: Orthopaedic Surgery | Admitting: Physical Therapy

## 2021-04-06 ENCOUNTER — Other Ambulatory Visit: Payer: Self-pay

## 2021-04-06 ENCOUNTER — Encounter: Payer: Self-pay | Admitting: Physical Therapy

## 2021-04-06 DIAGNOSIS — G8929 Other chronic pain: Secondary | ICD-10-CM

## 2021-04-06 DIAGNOSIS — R262 Difficulty in walking, not elsewhere classified: Secondary | ICD-10-CM

## 2021-04-06 DIAGNOSIS — M25562 Pain in left knee: Secondary | ICD-10-CM | POA: Insufficient documentation

## 2021-04-06 DIAGNOSIS — M25561 Pain in right knee: Secondary | ICD-10-CM | POA: Insufficient documentation

## 2021-04-06 DIAGNOSIS — R296 Repeated falls: Secondary | ICD-10-CM

## 2021-04-06 NOTE — Therapy (Signed)
[facility]. [place], [place], [address] Phone: [phone]   Fax:  [phone]  Physical Therapy Evaluation  Patient Details  Name: [name] MRN: [id number] Date of Birth: 1926-05-14 Referring Provider (PT): Ninfa Linden   Encounter Date: 04/06/2021   PT End of Session - 04/06/21 1050     Visit Number 1    Date for PT Re-Evaluation 07/05/21    Authorization Type A[employer]na Medicare    PT Start Time 1010    PT Stop Time 6962    PT Time Calculation (min) 41 min    Activity Tolerance Patient tolerated treatment well    Behavior During Therapy [facility] for tasks assessed/performed             Past Medical History:  Diagnosis Date   Aortic valve sclerosis    CAD (coronary artery disease)    S/p DES to the proximal RCA, POBA to the distal RCA 8/08 //Myoview 2009 low risk // Myoview 9/22: EF 57, no ischemia or infarction, apical thinning artifact, low risk study   DUB (dysfunctional uterine bleeding)    Elevated cholesterol    Atorvastatin   GERD (gastroesophageal reflux disease)    History of echocardiogram    EF 60-65, GRII DD, GLS -18.4, normal RVSF, moderate MAC, moderate AV calcification, trivial AI   Hypertension    Plantar fasciitis    Shingles     Past Surgical History:  Procedure Laterality Date   APPENDECTOMY  2011   CARDIAC STINT IMPLANT  2008   CARPAL TUNNEL [place]  2009   VAGINAL HYSTERECTOMY  2001   LAVH, BSO    There were no vitals filed for this visit.    Subjective Assessment - 04/06/21 1017     Subjective Patient reports that she has had knee pain for a number of years, right worse than the left, reports that she feels like the left knee is weak.  She does report diffiuclty getting up and down, difficulty walking and shopping, Reports that a recent injection really helped her pain, but  she is still very weak and unsteady, reports a fear of falling    Limitations Standing;Walking;House hold activities    Patient Stated Goals feel stronger and feel like I have better balance    Currently in Pain? Yes    Pain Score 0-No pain    Pain Location Knee    Pain Orientation Right;Left    Pain Descriptors / Indicators Aching;Sore;Throbbing    Pain Type Chronic pain    Pain Onset More than a month ago    Pain Frequency Intermittent    Aggravating Factors  sitting, walking pain up to 9/10    Pain Relieving Factors The injection really helped the pain 0/10    Effect of Pain on Daily Activities difficulty walking, shopping, housework                [facility] PT Assessment - 04/06/21 0001       Assessment   Medical Diagnosis knee pain, loss of balance    Referring Provider (PT) Ninfa Linden    Onset Date/Surgical Date 03/06/21    Prior Therapy no      Precautions   Precautions Fall      Balance Screen   Has the patient fallen in the past 6  months Yes    How many times? 1    Has the patient had a decrease in activity level because of a fear of falling?  Yes    Is the patient reluctant to leave their home because of a fear of falling?  Yes      Home Environment   Additional Comments one step, does housework, does some shopping      Prior Function   Level of Independence Independent    Vocation Retired    Leisure reports tries to walk 8000 steps a day, plays cards      Posture/Postural Control   Posture Comments rounded shoulders, decreased lordosis      ROM / Strength   AROM / PROM / Strength AROM;Strength      AROM   Overall AROM Comments hips WFL's, knee extension WFL's, knee flexion to 105 degrees bilaterally      Strength   Overall Strength Comments 4-/5 hips, knees and ankles      Palpation   Palpation comment crepitus in the knees      Transfers   Comments definite need for hands      Ambulation/Gait   Gait Comments no device, valgus at knees, stooped  posture, antalgic on the left, slow      Standardized Balance Assessment   Standardized Balance Assessment Timed Up and Go Test;Berg Balance Test      Berg Balance Test   Sit to Stand Able to stand using hands after several tries    Standing Unsupported Able to stand 2 minutes with supervision    Sitting with Back Unsupported but Feet Supported on Floor or Stool Able to sit safely and securely 2 minutes    Stand to Sit Controls descent by using hands    Transfers Able to transfer safely, definite need of hands    Standing Unsupported with Eyes Closed Able to stand 10 seconds with supervision    Standing Unsupported with Feet Together Able to place feet together independently and stand for 1 minute with supervision    From Standing, Reach Forward with Outstretched Arm Can reach forward >12 cm safely (5")    From Standing Position, Pick up Object from Floor Able to pick up shoe, needs supervision    From Standing Position, Turn to Look Behind Over each Shoulder Turn sideways only but maintains balance    Turn 360 Degrees Able to turn 360 degrees safely but slowly    Standing Unsupported, Alternately Place Feet on Step/Stool Able to complete >2 steps/needs minimal assist    Standing Unsupported, One Foot in Front Needs help to step but can hold 15 seconds    Standing on One Leg Tries to lift leg/unable to hold 3 seconds but remains standing independently    Total Score 34      Timed Up and Go Test   Normal TUG (seconds) 20                        Objective measurements completed on examination: See above findings.                PT Education - 04/06/21 1050     Education Details hip 4 way holding onto counter, side stepping    Person(s) Educated Patient    Methods Explanation;Demonstration;Handout    Comprehension Verbalized understanding              PT Short Term Goals - 04/06/21 1054  PT SHORT TERM GOAL #1   Title independent with initial  HEP    Time 2    Period Weeks    Status New               PT Long Term Goals - 04/06/21 1054       PT LONG TERM GOAL #1   Title understand proper posture and body mechanics    Time 12    Period Weeks    Status New      PT LONG TERM GOAL #2   Title decrease TUG time to 14 seconds    Time 12    Period Weeks    Status New      PT LONG TERM GOAL #3   Title increase berg blance score to 46/56    Time 12    Period Weeks      PT LONG TERM GOAL #4   Title report no fear of shopping    Time 12    Period Weeks    Status New                    Plan - 04/06/21 1051     Clinical Impression Statement Patient reports bilateral knee pain, she had injection that helped the pain but reports that her biggest issue is a fall a few month ago and now fear and she feels like she is not walking well.  She has weakness of the LE's, forward flexed trunk, appears to have some scoliosis.  Reports that the left knee feels like it will give way.  TUG time was 20 seconds, Berg balance was 34/56 putting her at a high risk for falls, she does report feeling better in her home and getting about 8000 steps a day, reports that outside and shopping she really feels off balance and scared.  She feels like she has limited her activities due to this    Stability/Clinical Decision Making Stable/Uncomplicated    Clinical Decision Making Low    Rehab Potential Good    PT Frequency 2x / week    PT Duration 12 weeks    PT Treatment/Interventions ADLs/Self Care Home Management;Gait training;Stair training;Functional mobility training;Therapeutic activities;Therapeutic exercise;Balance training;Neuromuscular re-education;Manual techniques;Patient/family education    PT Next Visit Plan start exercises and balance    Consulted and Agree with Plan of Care Patient             Patient will benefit from skilled therapeutic intervention in order to improve the following deficits and impairments:   Abnormal gait, Decreased range of motion, Difficulty walking, Pain, Decreased balance, Improper body mechanics, Postural dysfunction, Decreased strength, Decreased mobility  Visit Diagnosis: Difficulty in walking, not elsewhere classified - Plan: PT plan of care cert/re-cert  Repeated falls - Plan: PT plan of care cert/re-cert  Chronic pain of left knee - Plan: PT plan of care cert/re-cert  Chronic pain of right knee - Plan: PT plan of care cert/re-cert     Problem List Patient Active Problem List   Diagnosis Date Noted   Aortic valve sclerosis 02/24/2021   Varicose veins of both lower extremities 02/24/2021   Left knee pain 04/28/2015   Atherosclerosis of native coronary artery of native heart without angina pectoris 11/22/2013   Essential hypertension 11/22/2013   Pure hypercholesterolemia 11/22/2013   Inversion, nipple 09/05/2013   Left ankle injury 02/14/2013   Right foot pain 06/08/2011   Shingles    Elevated cholesterol    Cancer ([place])  Right knee pain 05/10/2011    Sumner Boast, PT 04/06/2021, 10:57 AM  [facility]. [place], [place], [address] Phone: [phone]   Fax:  [phone]  Name: [name] MRN: [id number] Date of Birth: 15-May-1926

## 2021-04-08 ENCOUNTER — Encounter: Payer: Self-pay | Admitting: Physical Therapy

## 2021-04-08 ENCOUNTER — Ambulatory Visit: Payer: Medicare HMO | Admitting: Physical Therapy

## 2021-04-08 ENCOUNTER — Other Ambulatory Visit: Payer: Self-pay

## 2021-04-08 DIAGNOSIS — G8929 Other chronic pain: Secondary | ICD-10-CM

## 2021-04-08 DIAGNOSIS — R262 Difficulty in walking, not elsewhere classified: Secondary | ICD-10-CM

## 2021-04-08 DIAGNOSIS — R296 Repeated falls: Secondary | ICD-10-CM

## 2021-04-08 NOTE — Therapy (Signed)
[facility]. [place], [place], [address] Phone: [phone]   Fax:  [phone]  Physical Therapy Treatment  Patient Details  Name: [name] MRN: [id number] Date of Birth: 1926/12/27 Referring Provider (PT): Ninfa Linden   Encounter Date: 04/08/2021   PT End of Session - 04/08/21 1146     Visit Number 2    Date for PT Re-Evaluation 07/05/21    Authorization Type Aetna Medicare    PT Start Time 1100    PT Stop Time 0258    PT Time Calculation (min) 45 min    Activity Tolerance Patient tolerated treatment well    Behavior During Therapy [facility] for tasks assessed/performed             Past Medical History:  Diagnosis Date   Aortic valve sclerosis    CAD (coronary artery disease)    S/p DES to the proximal RCA, POBA to the distal RCA 8/08 //Myoview 2009 low risk // Myoview 9/22: EF 57, no ischemia or infarction, apical thinning artifact, low risk study   DUB (dysfunctional uterine bleeding)    Elevated cholesterol    Atorvastatin   GERD (gastroesophageal reflux disease)    History of echocardiogram    EF 60-65, GRII DD, GLS -18.4, normal RVSF, moderate MAC, moderate AV calcification, trivial AI   Hypertension    Plantar fasciitis    Shingles     Past Surgical History:  Procedure Laterality Date   APPENDECTOMY  2011   CARDIAC STINT IMPLANT  2008   CARPAL TUNNEL [place]  2009   VAGINAL HYSTERECTOMY  2001   LAVH, BSO    There were no vitals filed for this visit.   Subjective Assessment - 04/08/21 1112     Subjective Patoient reports that she was very proud of herself and did all of the exercises but reports that her legs are very tired and she does not feel steady today, reports pain in the knee with sitting    Currently in Pain? No/denies                               [facility]  Adult PT Treatment/Exercise - 04/08/21 0001       Ambulation/Gait   Gait Comments fast HHA walk 2x 100'      High Level Balance   High Level Balance Activities Side stepping;Backward walking    High Level Balance Comments alternating toe touches, side stepping over objexts, ball toss      Exercises   Exercises Knee/Hip      Knee/Hip Exercises: Aerobic   Recumbent Bike level 1 x 5 minutes    Nustep level 4 x 5 minutes      Knee/Hip Exercises: Machines for Strengthening   Cybex Knee Extension 5# 2x5    Cybex Knee Flexion 15# 2x10      Knee/Hip Exercises: Seated   Other Seated Knee/Hip Exercises ankle PF/DF 2x10 red tband                       PT Short Term Goals - 04/08/21 1148       PT SHORT TERM GOAL #1   Title independent with initial HEP    Status Partially Met  PT Long Term Goals - 04/06/21 1054       PT LONG TERM GOAL #1   Title understand proper posture and body mechanics    Time 12    Period Weeks    Status New      PT LONG TERM GOAL #2   Title decrease TUG time to 14 seconds    Time 12    Period Weeks    Status New      PT LONG TERM GOAL #3   Title increase berg blance score to 46/56    Time 12    Period Weeks      PT LONG TERM GOAL #4   Title report no fear of shopping    Time 12    Period Weeks    Status New                   Plan - 04/08/21 1147     Clinical Impression Statement Patient reports that she over did it with the exercises at home and her legs are tired and she is not walking very good today.  I asked her to not do exercises today.  She did not have pain with the initiation of strength and balance activities.  She tolerated well with some fatigue, does need CGA for most of the walking and the balance activities due to decreased balance    PT Next Visit Plan slowly progress    Consulted and Agree with Plan of Care Patient             Patient will benefit from skilled therapeutic  intervention in order to improve the following deficits and impairments:  Abnormal gait, Decreased range of motion, Difficulty walking, Pain, Decreased balance, Improper body mechanics, Postural dysfunction, Decreased strength, Decreased mobility  Visit Diagnosis: Difficulty in walking, not elsewhere classified  Repeated falls  Chronic pain of left knee  Chronic pain of right knee     Problem List Patient Active Problem List   Diagnosis Date Noted   Aortic valve sclerosis 02/24/2021   Varicose veins of both lower extremities 02/24/2021   Left knee pain 04/28/2015   Atherosclerosis of native coronary artery of native heart without angina pectoris 11/22/2013   Essential hypertension 11/22/2013   Pure hypercholesterolemia 11/22/2013   Inversion, nipple 09/05/2013   Left ankle injury 02/14/2013   Right foot pain 06/08/2011   Shingles    Elevated cholesterol    Cancer ([place])    Right knee pain 05/10/2011    Sumner Boast, PT 04/08/2021, 11:49 AM  [place]. [place], [place], [address] Phone: [phone]   Fax:  [phone]  Name: [name] MRN: [id number] Date of Birth: Sep 04, 1926

## 2021-04-13 ENCOUNTER — Encounter: Payer: Self-pay | Admitting: Physical Therapy

## 2021-04-13 ENCOUNTER — Ambulatory Visit: Payer: Medicare HMO | Admitting: Physical Therapy

## 2021-04-13 ENCOUNTER — Other Ambulatory Visit: Payer: Self-pay

## 2021-04-13 DIAGNOSIS — R262 Difficulty in walking, not elsewhere classified: Secondary | ICD-10-CM

## 2021-04-13 DIAGNOSIS — R296 Repeated falls: Secondary | ICD-10-CM

## 2021-04-13 DIAGNOSIS — G8929 Other chronic pain: Secondary | ICD-10-CM

## 2021-04-13 NOTE — Therapy (Signed)
[place] [place], [place], [address] Phone: [phone]   Fax:  [phone]  Physical Therapy Treatment  Patient Details  Name: [name] MRN: [id number] Date of Birth: 21-Nov-1926 Referring Provider (PT): Ninfa Linden   Encounter Date: 04/13/2021   PT End of Session - 04/13/21 1343     Visit Number 3    Date for PT Re-Evaluation 07/05/21    Authorization Type [employer] Medicare    PT Start Time 1100    PT Stop Time 6734    PT Time Calculation (min) 165 min    Activity Tolerance Patient tolerated treatment well    Behavior During Therapy [facility] for tasks assessed/performed             Past Medical History:  Diagnosis Date   Aortic valve sclerosis    CAD (coronary artery disease)    S/p DES to the proximal RCA, POBA to the distal RCA 8/08 //Myoview 2009 low risk // Myoview 9/22: EF 57, no ischemia or infarction, apical thinning artifact, low risk study   DUB (dysfunctional uterine bleeding)    Elevated cholesterol    Atorvastatin   GERD (gastroesophageal reflux disease)    History of echocardiogram    EF 60-65, GRII DD, GLS -18.4, normal RVSF, moderate MAC, moderate AV calcification, trivial AI   Hypertension    Plantar fasciitis    Shingles     Past Surgical History:  Procedure Laterality Date   APPENDECTOMY  2011   CARDIAC STINT IMPLANT  2008   CARPAL TUNNEL [place]  2009   VAGINAL HYSTERECTOMY  2001   LAVH, BSO    There were no vitals filed for this visit.   Subjective Assessment - 04/13/21 1300     Subjective Legs are weak today, having some knee pain today    Currently in Pain? Yes    Pain Score 3     Pain Location Knee    Pain Orientation Right                               [facility] Adult PT Treatment/Exercise - 04/13/21 0001       High Level Balance   High  Level Balance Activities Side stepping;Backward walking    High Level Balance Comments alternating toe touches, ball toss   HHA x1 with toe touches     Knee/Hip Exercises: Aerobic   Recumbent Bike level 1 x 4 minutes    Nustep level 3 x 5 minutes      Knee/Hip Exercises: Machines for Strengthening   Cybex Knee Extension 5# 2x10    Cybex Knee Flexion 15# 2x10      Knee/Hip Exercises: Seated   Ball Squeeze 2x10    Other Seated Knee/Hip Exercises ankle PF/DF 2x10 red tband    Sit to Sand 1 set;5 reps;with UE support      Manual Therapy   Manual Therapy Passive ROM    Passive ROM Knee flex and Ext bilat                       PT Short Term Goals - 04/08/21 1148       PT SHORT TERM GOAL #1   Title independent with initial HEP  Status Partially Met               PT Long Term Goals - 04/06/21 1054       PT LONG TERM GOAL #1   Title understand proper posture and body mechanics    Time 12    Period Weeks    Status New      PT LONG TERM GOAL #2   Title decrease TUG time to 14 seconds    Time 12    Period Weeks    Status New      PT LONG TERM GOAL #3   Title increase berg blance score to 46/56    Time 12    Period Weeks      PT LONG TERM GOAL #4   Title report no fear of shopping    Time 12    Period Weeks    Status New                   Plan - 04/13/21 1343     Clinical Impression Statement Pt enters clinic reporting some bilateral Le weakness. She does require a few seconds to get started with gait after standing.All interventions completed well. Some L knee pain reported with sit to stands. Cue to increase ROM needed with curls and extensions. SBA needed with backwards walking and side stepping.    Stability/Clinical Decision Making Stable/Uncomplicated    Rehab Potential Good    PT Frequency 2x / week    PT Duration 12 weeks    PT Treatment/Interventions ALS/Self Care Home Management;Gait training;Stair training;Functional  mobility training;Therapeutic activities;Therapeutic exercise;Balance training;Neuromuscular re-education;Manual techniques;Patient/family education    PT Next Visit Plan slowly progress             Patient will benefit from skilled therapeutic intervention in order to improve the following deficits and impairments:  Abnormal gait, Decreased range of motion, Difficulty walking, Pain, Decreased balance, Improper body mechanics, Postural dysfunction, Decreased strength, Decreased mobility  Visit Diagnosis: Difficulty in walking, not elsewhere classified  Chronic pain of left knee  Repeated falls  Chronic pain of right knee     Problem List Patient Active Problem List   Diagnosis Date Noted   Aortic valve sclerosis 02/24/2021   Varicose veins of both lower extremities 02/24/2021   Left knee pain 04/28/2015   Atherosclerosis of native coronary artery of native heart without angina pectoris 11/22/2013   Essential hypertension 11/22/2013   Pure hypercholesterolemia 11/22/2013   Inversion, nipple 09/05/2013   Left ankle injury 02/14/2013   Right foot pain 06/08/2011   Shingles    Elevated cholesterol    Cancer ([place])    Right knee pain 05/10/2011    Scot Jun, PTA 04/13/2021, 1:45 PM  [place]. [place], [place], [address] Phone: [phone]   Fax:  [phone]  Name: [name] MRN: [id number] Date of Birth: 17-Mar-1926

## 2021-04-14 LAB — TSH: TSH: 3.33 (ref 0.41–5.90)

## 2021-04-15 ENCOUNTER — Ambulatory Visit: Payer: Medicare HMO | Attending: Orthopaedic Surgery | Admitting: Physical Therapy

## 2021-04-15 ENCOUNTER — Other Ambulatory Visit: Payer: Self-pay

## 2021-04-15 ENCOUNTER — Encounter: Payer: Self-pay | Admitting: Physical Therapy

## 2021-04-15 DIAGNOSIS — R296 Repeated falls: Secondary | ICD-10-CM | POA: Insufficient documentation

## 2021-04-15 DIAGNOSIS — R262 Difficulty in walking, not elsewhere classified: Secondary | ICD-10-CM | POA: Diagnosis not present

## 2021-04-15 DIAGNOSIS — M25562 Pain in left knee: Secondary | ICD-10-CM | POA: Insufficient documentation

## 2021-04-15 DIAGNOSIS — M25561 Pain in right knee: Secondary | ICD-10-CM | POA: Insufficient documentation

## 2021-04-15 DIAGNOSIS — G8929 Other chronic pain: Secondary | ICD-10-CM | POA: Diagnosis present

## 2021-04-15 NOTE — Therapy (Signed)
[place]. [place], [place], [address] Phone: [phone]   Fax:  [phone]  Physical Therapy Treatment  Patient Details  Name: [name] MRN: [id number] Date of Birth: Feb 01, 1927 Referring Provider (PT): Ninfa Linden   Encounter Date: 04/15/2021   PT End of Session - 04/15/21 1345     Visit Number 4    Date for PT Re-Evaluation 07/05/21    PT Start Time 1300    PT Stop Time 1345    PT Time Calculation (min) 45 min    Activity Tolerance Patient tolerated treatment well    Behavior During Therapy [facility] for tasks assessed/performed             Past Medical History:  Diagnosis Date   Aortic valve sclerosis    CAD (coronary artery disease)    S/p DES to the proximal RCA, POBA to the distal RCA 8/08 //Myoview 2009 low risk // Myoview 9/22: EF 57, no ischemia or infarction, apical thinning artifact, low risk study   DUB (dysfunctional uterine bleeding)    Elevated cholesterol    Atorvastatin   GERD (gastroesophageal reflux disease)    History of echocardiogram    EF 60-65, GRII DD, GLS -18.4, normal RVSF, moderate MAC, moderate AV calcification, trivial AI   Hypertension    Plantar fasciitis    Shingles     Past Surgical History:  Procedure Laterality Date   APPENDECTOMY  2011   CARDIAC STINT IMPLANT  2008   CARPAL TUNNEL [place]  2009   VAGINAL HYSTERECTOMY  2001   LAVH, BSO    There were no vitals filed for this visit.   Subjective Assessment - 04/15/21 1302     Subjective L knee is not doing too good. Can feel her L knee.  All test at MD was normal yesterday    Currently in Pain? Yes    Pain Score 3     Pain Location Knee    Pain Orientation Left                               [facility] Adult PT Treatment/Exercise - 04/15/21 0001       Knee/Hip Exercises: Aerobic    Recumbent Bike level 1 x 4 minutes    Nustep level 4 x 5 minutes      Knee/Hip Exercises: Standing   Walking with Sports Cord 10lb 4 way x 3 each    Other Standing Knee Exercises Alt 4 in box taps 2x10      Knee/Hip Exercises: Seated   Long Arc Quad Strengthening;2 sets;10 reps    Long Arc Quad Weight 3 lbs.    Marching Both;2 sets;Strengthening;10 reps    Marching Weights 3 lbs.    Hamstring Curl Strengthening;Both;2 sets;10 reps    Hamstring Limitations red Tband                       PT Short Term Goals - 04/08/21 1148       PT SHORT TERM GOAL #1   Title independent with initial HEP    Status Partially Met               PT Long Term Goals - 04/06/21 1054  PT LONG TERM GOAL #1  ° Title understand proper posture and body mechanics   ° Time 12   ° Period Weeks   ° Status New   °  ° PT LONG TERM GOAL #2  ° Title decrease TUG time to 14 seconds   ° Time 12   ° Period Weeks   ° Status New   °  ° PT LONG TERM GOAL #3  ° Title increase berg blance score to 46/56   ° Time 12   ° Period Weeks   °  ° PT LONG TERM GOAL #4  ° Title report no fear of shopping   ° Time 12   ° Period Weeks   ° Status New   ° °  °  ° °  ° ° ° ° ° ° ° ° Plan - 04/15/21 1345   ° ° Clinical Impression Statement Pt did well with a progressed session. Cues for eccentric control needed with resisted gait. Some instability with resisted side step with weight on pts R side. Som hesitation with alt box taps but pt able to complete with SBA. Increase in L knee discomfort reported with sit to stands.   ° Stability/Clinical Decision Making Stable/Uncomplicated   ° Rehab Potential Good   ° PT Frequency 2x / week   ° PT Duration 12 weeks   ° PT Treatment/Interventions ADLs/Self Care Home Management;Gait training;Stair training;Functional mobility training;Therapeutic activities;Therapeutic exercise;Balance training;Neuromuscular re-education;Manual techniques;Patient/family education   ° PT Next Visit Plan  slowly progress   ° °  °  ° °  ° ° °Patient will benefit from skilled therapeutic intervention in order to improve the following deficits and impairments:  Abnormal gait, Decreased range of motion, Difficulty walking, Pain, Decreased balance, Improper body mechanics, Postural dysfunction, Decreased strength, Decreased mobility ° °Visit Diagnosis: °Difficulty in walking, not elsewhere classified ° °Repeated falls ° °Chronic pain of right knee ° °Chronic pain of left knee ° ° ° ° °Problem List °Patient Active Problem List  ° Diagnosis Date Noted  ° Aortic valve sclerosis 02/24/2021  ° Varicose veins of both lower extremities 02/24/2021  ° Left knee pain 04/28/2015  ° Atherosclerosis of native coronary artery of native heart without angina pectoris 11/22/2013  ° Essential hypertension 11/22/2013  ° Pure hypercholesterolemia 11/22/2013  ° Inversion, nipple 09/05/2013  ° Left ankle injury 02/14/2013  ° Right foot pain 06/08/2011  ° Shingles   ° Elevated cholesterol   ° Cancer ([facility])   ° Right knee pain 05/10/2011  ° ° °Ronald G Pemberton, PTA °04/15/2021, 1:47 PM ° °[place] °[facility] °[address] °[place], [place], [address] °Phone: [phone]   Fax:  [phone] ° °Name: [name] °MRN: [id number] °Date of Birth: 12/15/1926 ° ° ° °

## 2021-04-20 ENCOUNTER — Other Ambulatory Visit: Payer: Self-pay

## 2021-04-20 ENCOUNTER — Ambulatory Visit: Payer: Medicare HMO | Admitting: Physical Therapy

## 2021-04-20 ENCOUNTER — Encounter: Payer: Self-pay | Admitting: Physical Therapy

## 2021-04-20 DIAGNOSIS — R262 Difficulty in walking, not elsewhere classified: Secondary | ICD-10-CM

## 2021-04-20 DIAGNOSIS — G8929 Other chronic pain: Secondary | ICD-10-CM

## 2021-04-20 DIAGNOSIS — R296 Repeated falls: Secondary | ICD-10-CM

## 2021-04-20 NOTE — Therapy (Signed)
[place]. [place], [place], [address] Phone: [phone]   Fax:  [phone]  Physical Therapy Treatment  Patient Details  Name: Taleeyah Bora MRN: [id number] Date of Birth: 03-18-1926 Referring Provider (PT): Ninfa Linden   Encounter Date: 04/20/2021   PT End of Session - 04/20/21 1349     Visit Number 5    Date for PT Re-Evaluation 07/05/21    Authorization Type [employer]na Medicare    PT Start Time 1300    PT Stop Time 9518    PT Time Calculation (min) 45 min    Activity Tolerance Patient tolerated treatment well    Behavior During Therapy [facility] for tasks assessed/performed             Past Medical History:  Diagnosis Date   Aortic valve sclerosis    CAD (coronary artery disease)    S/p DES to the proximal RCA, POBA to the distal RCA 8/08 //Myoview 2009 low risk // Myoview 9/22: EF 57, no ischemia or infarction, apical thinning artifact, low risk study   DUB (dysfunctional uterine bleeding)    Elevated cholesterol    Atorvastatin   GERD (gastroesophageal reflux disease)    History of echocardiogram    EF 60-65, GRII DD, GLS -18.4, normal RVSF, moderate MAC, moderate AV calcification, trivial AI   Hypertension    Plantar fasciitis    Shingles     Past Surgical History:  Procedure Laterality Date   APPENDECTOMY  2011   CARDIAC STINT IMPLANT  2008   CARPAL TUNNEL [place]  2009   VAGINAL HYSTERECTOMY  2001   LAVH, BSO    There were no vitals filed for this visit.   Subjective Assessment - 04/20/21 1305     Subjective Good except for weak legs, no Covid like symptoms.    Currently in Pain? No/denies                               [facility] Adult PT Treatment/Exercise - 04/20/21 0001       Knee/Hip Exercises: Aerobic   Nustep level 4 x 6 minutes      Knee/Hip Exercises: Standing    Forward Step Up Both;2 sets;5 reps;Hand Hold: 0;Step Height: 4"    Other Standing Knee Exercises Standing march 3lb 2x10      Knee/Hip Exercises: Seated   Long Arc Quad Strengthening;2 sets;10 reps    Long Arc Quad Weight 3 lbs.    Hamstring Curl Strengthening;Both;2 sets;10 reps    Hamstring Limitations green tband    Sit to Sand 2 sets;10 reps;with UE support      Knee/Hip Exercises: Supine   Bridges Strengthening;Both;2 sets;10 reps    Straight Leg Raises Strengthening;Both;1 set;10 reps                       PT Short Term Goals - 04/08/21 1148       PT SHORT TERM GOAL #1   Title independent with initial HEP    Status Partially Met               PT Long Term Goals - 04/20/21 1349       PT LONG TERM GOAL #1   Title understand proper  posture and body mechanics    Status Partially Met      PT LONG TERM GOAL #2   Title decrease TUG time to 14 seconds    Status On-going      PT LONG TERM GOAL #3   Title increase berg blance score to 46/56    Status On-going      PT LONG TERM GOAL #4   Title report no fear of shopping    Status On-going                   Plan - 04/20/21 1350     Clinical Impression Statement Pt able to progress to more functional interventions. Increase reps tolerated with sit to stands. Pt able to complete standing resisted marches without UE support. CGA needed to complete step ups without UE. Increase resistance tolerated with HS curls. Encouragement needed throughout session.    Stability/Clinical Decision Making Stable/Uncomplicated    Rehab Potential Good    PT Frequency 2x / week    PT Duration 12 weeks    PT Treatment/Interventions ADLs/Self Care Home Management;Gait training;Stair training;Functional mobility training;Therapeutic activities;Therapeutic exercise;Balance training;Neuromuscular re-education;Manual techniques;Patient/family education    PT Next Visit Plan slowly progress             Patient  will benefit from skilled therapeutic intervention in order to improve the following deficits and impairments:  Abnormal gait, Decreased range of motion, Difficulty walking, Pain, Decreased balance, Improper body mechanics, Postural dysfunction, Decreased strength, Decreased mobility  Visit Diagnosis: Difficulty in walking, not elsewhere classified  Chronic pain of right knee  Repeated falls     Problem List Patient Active Problem List   Diagnosis Date Noted   Aortic valve sclerosis 02/24/2021   Varicose veins of both lower extremities 02/24/2021   Left knee pain 04/28/2015   Atherosclerosis of native coronary artery of native heart without angina pectoris 11/22/2013   Essential hypertension 11/22/2013   Pure hypercholesterolemia 11/22/2013   Inversion, nipple 09/05/2013   Left ankle injury 02/14/2013   Right foot pain 06/08/2011   Shingles    Elevated cholesterol    Cancer ([place])    Right knee pain 05/10/2011    Scot Jun, PTA 04/20/2021, 1:53 PM  [place]. [place], [place], [address] Phone: [phone]   Fax:  [phone]  Name: Enid Maultsby MRN: [id number] Date of Birth: 12-Sep-1926

## 2021-04-22 ENCOUNTER — Encounter: Payer: Self-pay | Admitting: Physical Therapy

## 2021-04-22 ENCOUNTER — Other Ambulatory Visit: Payer: Self-pay

## 2021-04-22 ENCOUNTER — Ambulatory Visit: Payer: Medicare HMO | Admitting: Physical Therapy

## 2021-04-22 DIAGNOSIS — R262 Difficulty in walking, not elsewhere classified: Secondary | ICD-10-CM

## 2021-04-22 DIAGNOSIS — M25561 Pain in right knee: Secondary | ICD-10-CM

## 2021-04-22 DIAGNOSIS — G8929 Other chronic pain: Secondary | ICD-10-CM

## 2021-04-22 DIAGNOSIS — R296 Repeated falls: Secondary | ICD-10-CM

## 2021-04-22 NOTE — Therapy (Signed)
[place]. [place], [place], [address] Phone: [phone]   Fax:  [phone]  Physical Therapy Treatment  Patient Details  Name: [name] MRN: [id number] Date of Birth: [birth year] Referring Provider (PT): Ninfa Linden   Encounter Date: 04/22/2021   PT End of Session - 04/22/21 1223     Visit Number 6    Date for PT Re-Evaluation 07/05/21    Authorization Type [employer] Medicare    PT Start Time 6754    PT Stop Time 1229    PT Time Calculation (min) 38 min    Activity Tolerance Patient tolerated treatment well    Behavior During Therapy [facility] for tasks assessed/performed             Past Medical History:  Diagnosis Date   Aortic valve sclerosis    CAD (coronary artery disease)    S/p DES to the proximal RCA, POBA to the distal RCA 8/08 //Myoview 2009 low risk // Myoview 9/22: EF 57, no ischemia or infarction, apical thinning artifact, low risk study   DUB (dysfunctional uterine bleeding)    Elevated cholesterol    Atorvastatin   GERD (gastroesophageal reflux disease)    History of echocardiogram    EF 60-65, GRII DD, GLS -18.4, normal RVSF, moderate MAC, moderate AV calcification, trivial AI   Hypertension    Plantar fasciitis    Shingles     Past Surgical History:  Procedure Laterality Date   APPENDECTOMY  2011   CARDIAC STINT IMPLANT  2008   CARPAL TUNNEL [place]  2009   VAGINAL HYSTERECTOMY  2001   LAVH, BSO    There were no vitals filed for this visit.   Subjective Assessment - 04/22/21 1152     Subjective My legs ave had better days    Currently in Pain? No/denies                               [facility] Adult PT Treatment/Exercise - 04/22/21 0001       Knee/Hip Exercises: Aerobic   Nustep level 4 x 6 minutes      Knee/Hip Exercises: Machines for Strengthening    Cybex Knee Extension 5# 2x10    Cybex Knee Flexion 20# 2x10      Knee/Hip Exercises: Standing   Forward Step Up Both;2 sets;5 reps;Hand Hold: 0;Step Height: 4";Step Height: 6";Hand Hold: 1      Knee/Hip Exercises: Seated   Sit to Sand 2 sets;10 reps;with UE support      Knee/Hip Exercises: Supine   Bridges Strengthening;Both;2 sets;10 reps    Other Supine Knee/Hip Exercises LE on pball bridges, K2C, Oblq                       PT Short Term Goals - 04/08/21 1148       PT SHORT TERM GOAL #1   Title independent with initial HEP    Status Partially Met               PT Long Term Goals - 04/22/21 1229       PT LONG TERM GOAL #1   Title understand proper posture and body mechanics  Plan - 04/22/21 1224     Clinical Impression Statement Pt ~ 6 minutes late reporting leg weakness. Despite claims she did well completing the interventions. increase reps tolerated with seated leg curls. HHA x 1 needed to complete step ups with 6 inch steps. fatigue reported with sit to stands. Tactile cues needed to maintain stability with supine interventions LE on Pball.    Stability/Clinical Decision Making Stable/Uncomplicated    Rehab Potential Good    PT Frequency 2x / week    PT Duration 12 weeks    PT Treatment/Interventions ADLs/Self Care Home Management;Gait training;Stair training;Functional mobility training;Therapeutic activities;Therapeutic exercise;Balance training;Neuromuscular re-education;Manual techniques;Patient/family education    PT Next Visit Plan slowly progress             Patient will benefit from skilled therapeutic intervention in order to improve the following deficits and impairments:  Abnormal gait, Decreased range of motion, Difficulty walking, Pain, Decreased balance, Improper body mechanics, Postural dysfunction, Decreased strength, Decreased mobility  Visit Diagnosis: Difficulty in walking, not elsewhere  classified  Chronic pain of left knee  Repeated falls  Chronic pain of right knee     Problem List Patient Active Problem List   Diagnosis Date Noted   Aortic valve sclerosis 02/24/2021   Varicose veins of both lower extremities 02/24/2021   Left knee pain 04/28/2015   Atherosclerosis of native coronary artery of native heart without angina pectoris 11/22/2013   Essential hypertension 11/22/2013   Pure hypercholesterolemia 11/22/2013   Inversion, nipple 09/05/2013   Left ankle injury 02/14/2013   Right foot pain 06/08/2011   Shingles    Elevated cholesterol    Cancer ([place])    Right knee pain 05/10/2011    Scot Jun, PTA 04/22/2021, 12:29 PM  [place]. [place], [place], [address] Phone: [phone]   Fax:  [phone]  Name: Adamae Ricklefs MRN: [id number] Date of Birth: 1926/05/25

## 2021-04-27 ENCOUNTER — Ambulatory Visit: Payer: Medicare HMO | Admitting: Physical Therapy

## 2021-04-27 ENCOUNTER — Encounter: Payer: Self-pay | Admitting: Physical Therapy

## 2021-04-27 DIAGNOSIS — G8929 Other chronic pain: Secondary | ICD-10-CM

## 2021-04-27 DIAGNOSIS — R296 Repeated falls: Secondary | ICD-10-CM

## 2021-04-27 DIAGNOSIS — R262 Difficulty in walking, not elsewhere classified: Secondary | ICD-10-CM | POA: Diagnosis not present

## 2021-04-27 DIAGNOSIS — M25561 Pain in right knee: Secondary | ICD-10-CM

## 2021-04-27 DIAGNOSIS — M25562 Pain in left knee: Secondary | ICD-10-CM

## 2021-04-27 NOTE — Therapy (Signed)
[place]. [place], [place], [address] Phone: [phone]   Fax:  [phone]  Physical Therapy Treatment  Patient Details  Name: [name] MRN: [id number] Date of Birth: 1926-03-26 Referring Provider (PT): Ninfa Linden   Encounter Date: 04/27/2021   PT End of Session - 04/27/21 1519     Visit Number 7    Date for PT Re-Evaluation 07/05/21    Authorization Type Aetna Medicare    PT Start Time 1435    PT Stop Time 1520    PT Time Calculation (min) 45 min    Activity Tolerance Patient limited by fatigue;Patient tolerated treatment well    Behavior During Therapy [facility] for tasks assessed/performed             Past Medical History:  Diagnosis Date   Aortic valve sclerosis    CAD (coronary artery disease)    S/p DES to the proximal RCA, POBA to the distal RCA 8/08 //Myoview 2009 low risk // Myoview 9/22: EF 57, no ischemia or infarction, apical thinning artifact, low risk study   DUB (dysfunctional uterine bleeding)    Elevated cholesterol    Atorvastatin   GERD (gastroesophageal reflux disease)    History of echocardiogram    EF 60-65, GRII DD, GLS -18.4, normal RVSF, moderate MAC, moderate AV calcification, trivial AI   Hypertension    Plantar fasciitis    Shingles     Past Surgical History:  Procedure Laterality Date   APPENDECTOMY  2011   CARDIAC STINT IMPLANT  2008   CARPAL TUNNEL [place]  2009   VAGINAL HYSTERECTOMY  2001   LAVH, BSO    There were no vitals filed for this visit.   Subjective Assessment - 04/27/21 1442     Subjective Patient reports that her legs continue to feel weaker, I am always clinched up    Currently in Pain? No/denies                               [facility] Adult PT Treatment/Exercise - 04/27/21 0001       Ambulation/Gait   Gait Comments gait  outside one HHA slopes, curbs, at a pretty good speed x 350 feet, then gait out to her car      Knee/Hip Exercises: Stretches   Passive Hamstring Stretch Both;4 reps;20 seconds    Piriformis Stretch Both;4 reps;20 seconds    Gastroc Stretch Both;4 reps;20 seconds      Knee/Hip Exercises: Seated   Long Arc Quad Strengthening;2 sets;10 reps    Long Arc Quad Weight 3 lbs.    Marching Both;2 sets;Strengthening;10 reps    Marching Limitations no weight    Hamstring Curl Strengthening;Both;2 sets;10 reps    Hamstring Limitations red tband      Knee/Hip Exercises: Supine   Short Arc Quad Sets Both;3 sets;10 reps    Short Arc Quad Sets Limitations 2.5    Hip Adduction Isometric 20 reps    Bridges with Clamshell Both;2 sets;10 reps    Other Supine Knee/Hip Exercises LE on pball bridges, K2C, Oblq, isometric abs                       PT Short Term Goals - 04/08/21 1148  PT SHORT TERM GOAL #1   Title independent with initial HEP    Status Partially Met               PT Long Term Goals - 04/27/21 1523       PT LONG TERM GOAL #1   Title understand proper posture and body mechanics    Status Partially Met      PT LONG TERM GOAL #2   Title decrease TUG time to 14 seconds    Status On-going      PT LONG TERM GOAL #3   Title increase berg blance score to 46/56      PT LONG TERM GOAL #4   Title report no fear of shopping    Status On-going                   Plan - 04/27/21 1520     Clinical Impression Statement Patient continues to report leg weakness, reports that she feels like she does not have any energy, when I asked some questions she reported feeling very tense, she reports waking up and jaw is clenched.  We talked about stress and how it could affect the body, she was able to walk very well outside wiht the HHA, great speed and reported feeling confident as long as she had my hand, when we walked to the car I was just supervision and she  was a little slower.    PT Next Visit Plan see if her knees and legs feel better, could check BP    Consulted and Agree with Plan of Care Patient             Patient will benefit from skilled therapeutic intervention in order to improve the following deficits and impairments:  Abnormal gait, Decreased range of motion, Difficulty walking, Pain, Decreased balance, Improper body mechanics, Postural dysfunction, Decreased strength, Decreased mobility  Visit Diagnosis: Difficulty in walking, not elsewhere classified  Chronic pain of left knee  Repeated falls  Chronic pain of right knee     Problem List Patient Active Problem List   Diagnosis Date Noted   Aortic valve sclerosis 02/24/2021   Varicose veins of both lower extremities 02/24/2021   Left knee pain 04/28/2015   Atherosclerosis of native coronary artery of native heart without angina pectoris 11/22/2013   Essential hypertension 11/22/2013   Pure hypercholesterolemia 11/22/2013   Inversion, nipple 09/05/2013   Left ankle injury 02/14/2013   Right foot pain 06/08/2011   Shingles    Elevated cholesterol    Cancer ([place])    Right knee pain 05/10/2011    Sumner Boast, PT 04/27/2021, 3:23 PM  [place]. [place], [place], [address] Phone: [phone]   Fax:  [phone]  Name: [name] MRN: [id number] Date of Birth: 1926-08-07

## 2021-04-29 ENCOUNTER — Encounter: Payer: Self-pay | Admitting: Physical Therapy

## 2021-04-29 ENCOUNTER — Ambulatory Visit: Payer: Medicare HMO | Admitting: Physical Therapy

## 2021-04-29 ENCOUNTER — Other Ambulatory Visit: Payer: Self-pay

## 2021-04-29 DIAGNOSIS — R296 Repeated falls: Secondary | ICD-10-CM

## 2021-04-29 DIAGNOSIS — R262 Difficulty in walking, not elsewhere classified: Secondary | ICD-10-CM

## 2021-04-29 DIAGNOSIS — M25562 Pain in left knee: Secondary | ICD-10-CM

## 2021-04-29 DIAGNOSIS — M25561 Pain in right knee: Secondary | ICD-10-CM

## 2021-04-29 DIAGNOSIS — G8929 Other chronic pain: Secondary | ICD-10-CM

## 2021-04-29 NOTE — Therapy (Signed)
[place]. [name], [place], [address] Phone: [phone]   Fax:  [phone]  Physical Therapy Treatment  Patient Details  Name: Liliyana Thobe MRN: [id number] Date of Birth: [birth year] Referring Provider (PT): Ninfa Linden   Encounter Date: 04/29/2021   PT End of Session - 04/29/21 1342     Visit Number 8    Date for PT Re-Evaluation 07/05/21    PT Start Time 1300    PT Stop Time 1344    PT Time Calculation (min) 44 min    Activity Tolerance Patient tolerated treatment well    Behavior During Therapy [facility] for tasks assessed/performed             Past Medical History:  Diagnosis Date   Aortic valve sclerosis    CAD (coronary artery disease)    S/p DES to the proximal RCA, POBA to the distal RCA 8/08 //Myoview 2009 low risk // Myoview 9/22: EF 57, no ischemia or infarction, apical thinning artifact, low risk study   DUB (dysfunctional uterine bleeding)    Elevated cholesterol    Atorvastatin   GERD (gastroesophageal reflux disease)    History of echocardiogram    EF 60-65, GRII DD, GLS -18.4, normal RVSF, moderate MAC, moderate AV calcification, trivial AI   Hypertension    Plantar fasciitis    Shingles     Past Surgical History:  Procedure Laterality Date   APPENDECTOMY  2011   CARDIAC STINT IMPLANT  2008   CARPAL TUNNEL [place]  2009   VAGINAL HYSTERECTOMY  2001   LAVH, BSO    There were no vitals filed for this visit.   Subjective Assessment - 04/29/21 1346     Subjective Legs feel weak    Currently in Pain? No/denies                               [facility] Adult PT Treatment/Exercise - 04/29/21 0001       Ambulation/Gait   Gait Comments gait outside aroud back building up and down slope. Cue to control gait speed with down hill ambulation      Knee/Hip Exercises:  Stretches   Passive Hamstring Stretch Both;4 reps;20 seconds    Gastroc Stretch Both;20 seconds;2 reps    Other Knee/Hip Stretches single K2C stretch      Knee/Hip Exercises: Aerobic   Nustep level 4 x 4 minutes      Knee/Hip Exercises: Seated   Long Arc Quad Strengthening;2 sets;10 reps    Long Arc Quad Weight 3 lbs.    Marching Both;2 sets;Strengthening;10 reps    Marching Limitations no weight    Hamstring Curl Strengthening;Both;2 sets;15 reps    Hamstring Limitations red tband      Knee/Hip Exercises: Supine   Hip Adduction Isometric 20 reps    Bridges Strengthening;Both;2 sets;10 reps                       PT Short Term Goals - 04/08/21 1148       PT SHORT TERM GOAL #1   Title independent with initial HEP    Status Partially Met               PT Long Term Goals -  04/27/21 1523       PT LONG TERM GOAL #1   Title understand proper posture and body mechanics    Status Partially Met      PT LONG TERM GOAL #2   Title decrease TUG time to 14 seconds    Status On-going      PT LONG TERM GOAL #3   Title increase berg blance score to 46/56      PT LONG TERM GOAL #4   Title report no fear of shopping    Status On-going                   Plan - 04/29/21 1343     Clinical Impression Statement Pt enters clinic reporting that she like the relaxed session from last times so some of those interventions were carryover today. Verbal and tactile cues required to control gait speed ambulating down slope. No issues noted with seated interventions. Cue for core engagement needed with supine interventions.    Stability/Clinical Decision Making Stable/Uncomplicated    Rehab Potential Good    PT Frequency 2x / week    PT Treatment/Interventions ADLs/Self Care Home Management;Gait training;Stair training;Functional mobility training;Therapeutic activities;Therapeutic exercise;Balance training;Neuromuscular re-education;Manual techniques;Patient/family  education             Patient will benefit from skilled therapeutic intervention in order to improve the following deficits and impairments:  Abnormal gait, Decreased range of motion, Difficulty walking, Pain, Decreased balance, Improper body mechanics, Postural dysfunction, Decreased strength, Decreased mobility  Visit Diagnosis: Difficulty in walking, not elsewhere classified  Chronic pain of right knee  Repeated falls  Chronic pain of left knee     Problem List Patient Active Problem List   Diagnosis Date Noted   Aortic valve sclerosis 02/24/2021   Varicose veins of both lower extremities 02/24/2021   Left knee pain 04/28/2015   Atherosclerosis of native coronary artery of native heart without angina pectoris 11/22/2013   Essential hypertension 11/22/2013   Pure hypercholesterolemia 11/22/2013   Inversion, nipple 09/05/2013   Left ankle injury 02/14/2013   Right foot pain 06/08/2011   Shingles    Elevated cholesterol    Cancer ([place])    Right knee pain 05/10/2011    Scot Jun, PTA 04/29/2021, 1:46 PM  [place]. [place], [place], [address] Phone: [phone]   Fax:  [phone]  Name: [name] MRN: [id number] Date of Birth: 1926/06/17

## 2021-05-04 ENCOUNTER — Encounter: Payer: Self-pay | Admitting: Physical Therapy

## 2021-05-04 ENCOUNTER — Other Ambulatory Visit: Payer: Self-pay

## 2021-05-04 ENCOUNTER — Ambulatory Visit: Payer: Medicare HMO | Admitting: Physical Therapy

## 2021-05-04 DIAGNOSIS — R262 Difficulty in walking, not elsewhere classified: Secondary | ICD-10-CM | POA: Diagnosis not present

## 2021-05-04 DIAGNOSIS — R296 Repeated falls: Secondary | ICD-10-CM

## 2021-05-04 DIAGNOSIS — G8929 Other chronic pain: Secondary | ICD-10-CM

## 2021-05-04 DIAGNOSIS — M25562 Pain in left knee: Secondary | ICD-10-CM

## 2021-05-04 NOTE — Therapy (Signed)
[place]. [place], [place], [address] Phone: [phone]   Fax:  [phone]  Physical Therapy Treatment  Patient Details  Name: [name] MRN: [id number] Date of Birth: 1927-02-16 Referring Provider (PT): Ninfa Linden   Encounter Date: 05/04/2021   PT End of Session - 05/04/21 1342     Visit Number 9    Date for PT Re-Evaluation 07/05/21    Authorization Type Aetna Medicare    PT Start Time 1300    PT Stop Time 7564    PT Time Calculation (min) 43 min    Activity Tolerance Patient tolerated treatment well             Past Medical History:  Diagnosis Date   Aortic valve sclerosis    CAD (coronary artery disease)    S/p DES to the proximal RCA, POBA to the distal RCA 8/08 //Myoview 2009 low risk // Myoview 9/22: EF 57, no ischemia or infarction, apical thinning artifact, low risk study   DUB (dysfunctional uterine bleeding)    Elevated cholesterol    Atorvastatin   GERD (gastroesophageal reflux disease)    History of echocardiogram    EF 60-65, GRII DD, GLS -18.4, normal RVSF, moderate MAC, moderate AV calcification, trivial AI   Hypertension    Plantar fasciitis    Shingles     Past Surgical History:  Procedure Laterality Date   APPENDECTOMY  2011   CARDIAC STINT IMPLANT  2008   CARPAL TUNNEL [place]  2009   VAGINAL HYSTERECTOMY  2001   LAVH, BSO    There were no vitals filed for this visit.   Subjective Assessment - 05/04/21 1301     Subjective "Better I guess" "If I can get over my tenseness I would be a lot better"                [facility] PT Assessment - 05/04/21 0001       Timed Up and Go Test   TUG Normal TUG    Normal TUG (seconds) 12.58                           [facility] Adult PT Treatment/Exercise - 05/04/21 0001       Ambulation/Gait   Gait Comments  gait outside aroud back building up and down slope. Cue to control gait speed with down hill ambulation      Knee/Hip Exercises: Aerobic   Nustep level 4 x 6 minutes      Knee/Hip Exercises: Machines for Strengthening   Cybex Knee Extension 5# 2x10    Cybex Knee Flexion 20# 2x12      Knee/Hip Exercises: Standing   Other Standing Knee Exercises Standing march 2x10      Knee/Hip Exercises: Seated   Hamstring Curl Strengthening;Both;2 sets;15 reps    Hamstring Limitations green    Sit to Sand with UE support;5 reps;3 sets   LE on airex                      PT Short Term Goals - 04/08/21 1148       PT SHORT TERM GOAL #1   Title independent with initial HEP    Status Partially Met  PT Long Term Goals - 05/04/21 1319       PT LONG TERM GOAL #2   Title decrease TUG time to 14 seconds    Status Achieved                   Plan - 05/04/21 1343     Clinical Impression Statement Pt has progressed towards LTG's. She has decreased her TUG time meeting goal. She did better controlling down hill gait speed without cues. Some fatigue noted during the up hill section of gait trial. Pt has difficult time controlling decent during sit to stands LE on airex. Good stability with standing marches.    Stability/Clinical Decision Making Stable/Uncomplicated    Rehab Potential Good    PT Frequency 2x / week    PT Duration 12 weeks    PT Treatment/Interventions ADLs/Self Care Home Management;Gait training;Stair training;Functional mobility training;Therapeutic activities;Therapeutic exercise;Balance training;Neuromuscular re-education;Manual techniques;Patient/family education    PT Next Visit Plan see if her knees and legs feel better, could check BP             Patient will benefit from skilled therapeutic intervention in order to improve the following deficits and impairments:  Abnormal gait, Decreased range of motion, Difficulty walking, Pain,  Decreased balance, Improper body mechanics, Postural dysfunction, Decreased strength, Decreased mobility  Visit Diagnosis: Difficulty in walking, not elsewhere classified  Repeated falls  Chronic pain of left knee  Chronic pain of right knee     Problem List Patient Active Problem List   Diagnosis Date Noted   Aortic valve sclerosis 02/24/2021   Varicose veins of both lower extremities 02/24/2021   Left knee pain 04/28/2015   Atherosclerosis of native coronary artery of native heart without angina pectoris 11/22/2013   Essential hypertension 11/22/2013   Pure hypercholesterolemia 11/22/2013   Inversion, nipple 09/05/2013   Left ankle injury 02/14/2013   Right foot pain 06/08/2011   Shingles    Elevated cholesterol    Cancer ([place])    Right knee pain 05/10/2011    Scot Jun, PTA 05/04/2021, 1:46 PM  [place]. [place], [place], [address] Phone: [phone]   Fax:  [phone]  Name: [name] MRN: [id number] Date of Birth: 01/14/1927

## 2021-05-06 ENCOUNTER — Encounter: Payer: Self-pay | Admitting: Physical Therapy

## 2021-05-06 ENCOUNTER — Encounter: Payer: Self-pay | Admitting: Orthopaedic Surgery

## 2021-05-06 ENCOUNTER — Ambulatory Visit: Payer: Medicare HMO | Admitting: Physical Therapy

## 2021-05-06 ENCOUNTER — Ambulatory Visit (INDEPENDENT_AMBULATORY_CARE_PROVIDER_SITE_OTHER): Payer: Medicare HMO | Admitting: Orthopaedic Surgery

## 2021-05-06 ENCOUNTER — Other Ambulatory Visit: Payer: Self-pay

## 2021-05-06 DIAGNOSIS — G8929 Other chronic pain: Secondary | ICD-10-CM

## 2021-05-06 DIAGNOSIS — R262 Difficulty in walking, not elsewhere classified: Secondary | ICD-10-CM

## 2021-05-06 DIAGNOSIS — M25561 Pain in right knee: Secondary | ICD-10-CM

## 2021-05-06 DIAGNOSIS — M25562 Pain in left knee: Secondary | ICD-10-CM

## 2021-05-06 DIAGNOSIS — R296 Repeated falls: Secondary | ICD-10-CM

## 2021-05-06 MED ORDER — DICLOFENAC SODIUM 1 % EX GEL: 4.0000 g | Freq: Four times a day (QID) | CUTANEOUS | 2 refills | Status: DC

## 2021-05-06 NOTE — Therapy (Signed)
[place]. [place], [place], [address] Phone: [phone]   Fax:  [phone] Progress Note Reporting Period 04/06/21 to 05/06/21   See note below for Objective Data and Assessment of Progress/Goals.     Physical Therapy Treatment  Patient Details  Name: [name] MRN: [id number] Date of Birth: 1927-01-25 Referring Provider (PT): Ninfa Linden   Encounter Date: 05/06/2021   PT End of Session - 05/06/21 1428     Visit Number 10    Date for PT Re-Evaluation 07/05/21    Authorization Type Aetna Medicare    PT Start Time 5681    PT Stop Time 1427    PT Time Calculation (min) 50 min    Activity Tolerance Patient tolerated treatment well    Behavior During Therapy [facility] for tasks assessed/performed             Past Medical History:  Diagnosis Date   Aortic valve sclerosis    CAD (coronary artery disease)    S/p DES to the proximal RCA, POBA to the distal RCA 8/08 //Myoview 2009 low risk // Myoview 9/22: EF 57, no ischemia or infarction, apical thinning artifact, low risk study   DUB (dysfunctional uterine bleeding)    Elevated cholesterol    Atorvastatin   GERD (gastroesophageal reflux disease)    History of echocardiogram    EF 60-65, GRII DD, GLS -18.4, normal RVSF, moderate MAC, moderate AV calcification, trivial AI   Hypertension    Plantar fasciitis    Shingles     Past Surgical History:  Procedure Laterality Date   APPENDECTOMY  2011   CARDIAC STINT IMPLANT  2008   CARPAL TUNNEL [place]  2009   VAGINAL HYSTERECTOMY  2001   LAVH, BSO    There were no vitals filed for this visit.   Subjective Assessment - 05/06/21 1341     Subjective I need to remember better, but I think I need to work on balance more, i don't feel confident'    Currently in Pain? No/denies                                [facility] Adult PT Treatment/Exercise - 05/06/21 0001       Ambulation/Gait   Gait Comments gait outside aroud back building up and down slope. Cue to control gait speed with down hill ambulation      High Level Balance   High Level Balance Activities Side stepping;Backward walking;Direction changes    High Level Balance Comments standing ball toss, eyes closed head turns, then on airex same, walking ball toss., in pbars side stepping on the airex balance beam, had her do figure 8's on the large mat, this was very difficult for her      Knee/Hip Exercises: Aerobic   Nustep level 5 x 6 minutes      Knee/Hip Exercises: Standing   Other Standing Knee Exercises red tband row and extension for core      Knee/Hip Exercises: Seated   Other Seated Knee/Hip Exercises ball in lap isometric abs                       PT Short Term Goals - 04/08/21 1148  PT SHORT TERM GOAL #1   Title independent with initial HEP    Status Partially Met               PT Long Term Goals - 05/06/21 1430       PT LONG TERM GOAL #1   Title understand proper posture and body mechanics    Status Partially Met      PT LONG TERM GOAL #2   Title decrease TUG time to 14 seconds    Status Achieved      PT LONG TERM GOAL #3   Title increase berg blance score to 46/56    Status On-going                   Plan - 05/06/21 1428     Clinical Impression Statement Patient reports that she wants to work on balance.  She reports some fear with balance and has a lot of diffiuclty with the dynamic surfaces, requires close SBA/CGA at times, I did some direction changes and she responded well    PT Next Visit Plan work on balance    Consulted and Agree with Plan of Care Patient             Patient will benefit from skilled therapeutic intervention in order to improve the following deficits and impairments:  Abnormal gait, Decreased range of  motion, Difficulty walking, Pain, Decreased balance, Improper body mechanics, Postural dysfunction, Decreased strength, Decreased mobility  Visit Diagnosis: Difficulty in walking, not elsewhere classified  Repeated falls  Chronic pain of left knee  Chronic pain of right knee     Problem List Patient Active Problem List   Diagnosis Date Noted   Aortic valve sclerosis 02/24/2021   Varicose veins of both lower extremities 02/24/2021   Left knee pain 04/28/2015   Atherosclerosis of native coronary artery of native heart without angina pectoris 11/22/2013   Essential hypertension 11/22/2013   Pure hypercholesterolemia 11/22/2013   Inversion, nipple 09/05/2013   Shingles    Elevated cholesterol    Cancer ([place])    Right knee pain 05/10/2011    [name], PT 05/06/2021, 2:30 PM  [place]. [place], [place], [address] Phone: [phone]   Fax:  [phone]  Name: [name] MRN: [id number] Date of Birth: [birth year]

## 2021-05-06 NOTE — Progress Notes (Signed)
Office Visit Note   Patient: [name]           Date of Birth: 01-May-1926           MRN: [id number] Visit Date: 05/06/2021              Requested by: Ginger Organ., MD [address] [place],  [place] [address] PCP: Ginger Organ., MD   Assessment & Plan: Visit Diagnoses:  1. Chronic pain of left knee   2. Chronic pain of right knee     Plan: Per patient's request we will have her continue to work with physical therapy on range of motion and quad strengthening.  She will begin taking the Mobic 15 mg once daily for 2 weeks then take as needed.  In 2 weeks she will begin using Voltaren gel on both knees 4 g up to 4 times daily.  Follow-up with [place] as needed.  Questions were encouraged and answered at length today by Dr. Ninfa Linden and myself.  Quad strengthening exercises and knee friendly exercises discussed with patient.  Follow-Up Instructions: Return if symptoms worsen or fail to improve.   Orders:  No orders of the defined types were placed in this encounter.  Meds ordered this encounter  Medications   diclofenac Sodium (VOLTAREN) 1 % GEL    Sig: Apply 4 g topically 4 (four) times daily.    Dispense:  350 g    Refill:  2      Procedures: No procedures performed   Clinical Data: No additional findings.   Subjective: Chief Complaint  Patient presents with   Right Knee - Follow-up   Left Knee - Follow-up    HPI Ms. Bebout returns today for follow-up of her bilateral knee pain.  She was last seen on 03/25/2021 and was given injections both knees.  She has been to formal therapy.  She finds both of these to be beneficial she states she is 60 to 65% better with therapy and the injections.  States she still has pain in the right knee.  Typically if the knee stays bent for long period of time.  She took 1 dose of Mobic and that was only because she was having severe pain.  Otherwise she is taking no NSAIDs.  She has had no new injury to either knee.  On  radiographs patient has well-preserved medial lateral joint lines but has significant patellofemoral crepitus both knees.  Review of Systems No fevers or chills.  See HPI otherwise negative or noncontributory  Objective: Vital Signs: LMP  (LMP Unknown)   Physical Exam General: Well-developed well-nourished female no acute distress mood affect appropriate Psych: Alert and oriented x3 Ortho Exam Bilateral knees good range of motion both knees.  Significant patellofemoral crepitus right knee with range of motion.  Tenderness along medial lateral joint line the right knee tenderness left knee medial joint line.  No instability valgus varus stressing of either knee. Specialty Comments:  No specialty comments available.  Imaging: No results found.   PMFS History: Patient Active Problem List   Diagnosis Date Noted   Aortic valve sclerosis 02/24/2021   Varicose veins of both lower extremities 02/24/2021   Left knee pain 04/28/2015   Atherosclerosis of native coronary artery of native heart without angina pectoris 11/22/2013   Essential hypertension 11/22/2013   Pure hypercholesterolemia 11/22/2013   Inversion, nipple 09/05/2013   Shingles    Elevated cholesterol    Cancer ([place])  Right knee pain 05/10/2011   Past Medical History:  Diagnosis Date   Aortic valve sclerosis    CAD (coronary artery disease)    S/p DES to the proximal RCA, POBA to the distal RCA 8/08 //Myoview 2009 low risk // Myoview 9/22: EF 57, no ischemia or infarction, apical thinning artifact, low risk study   DUB (dysfunctional uterine bleeding)    Elevated cholesterol    Atorvastatin   GERD (gastroesophageal reflux disease)    History of echocardiogram    EF 60-65, GRII DD, GLS -18.4, normal RVSF, moderate MAC, moderate AV calcification, trivial AI   Hypertension    Plantar fasciitis    Shingles     Family History  Problem Relation Age of Onset   Heart attack Mother    Hypertension Mother    Heart  attack Father    CAD Father    Heart disease Brother    Hypertension Brother    Diabetes Brother    COPD Brother    Asthma Brother    Breast cancer Neg Hx     Past Surgical History:  Procedure Laterality Date   APPENDECTOMY  2011   CARDIAC STINT IMPLANT  2008   CARPAL TUNNEL RELEASE     EYE SURGERY     INGUINAL HERNIA REPAIR     OOPHORECTOMY  2001   [place] RELEASE  2009   VAGINAL HYSTERECTOMY  2001   LAVH, BSO   Social History   Occupational History   Not on file  Tobacco Use   Smoking status: Former   Smokeless tobacco: Never  Vaping Use   Vaping Use: Never used  Substance and Sexual Activity   Alcohol use: No    Alcohol/week: 0.0 standard drinks   Drug use: No   Sexual activity: Never    Birth control/protection: Post-menopausal, Surgical    Comment: INTERCOURSE AGE 14, SEXUAL PARTNERS LEWSS THAN 7

## 2021-05-07 LAB — CBC AND DIFFERENTIAL
HCT: 36 (ref 36–46)
Hemoglobin: 12.3 (ref 12.0–16.0)
Platelets: 208 (ref 150–399)
WBC: 5.8

## 2021-05-07 LAB — HEPATIC FUNCTION PANEL
ALT: 21 (ref 7–35)
AST: 25 (ref 13–35)
Alkaline Phosphatase: 95 (ref 25–125)
Bilirubin, Total: 0.9

## 2021-05-07 LAB — LIPID PANEL
Cholesterol: 158 (ref 0–200)
HDL: 89 — AB (ref 35–70)
LDL Cholesterol: 60
Triglycerides: 38 — AB (ref 40–160)

## 2021-05-07 LAB — BASIC METABOLIC PANEL
BUN: 18 (ref 4–21)
CO2: 24 — AB (ref 13–22)
Chloride: 96 — AB (ref 99–108)
Creatinine: 0.9 (ref <=1.1)
Glucose: 80
Potassium: 5 (ref 3.4–5.3)
Sodium: 131 — AB (ref 137–147)

## 2021-05-07 LAB — COMPREHENSIVE METABOLIC PANEL
Albumin: 4.3 (ref 3.5–5.0)
Calcium: 9.4 (ref 8.7–10.7)
Globulin: 2

## 2021-05-07 LAB — CBC: RBC: 4 (ref 3.87–5.11)

## 2021-05-13 ENCOUNTER — Ambulatory Visit: Payer: Medicare HMO | Admitting: Physical Therapy

## 2021-05-14 ENCOUNTER — Ambulatory Visit (INDEPENDENT_AMBULATORY_CARE_PROVIDER_SITE_OTHER): Payer: Medicare HMO | Admitting: Family Medicine

## 2021-05-14 ENCOUNTER — Encounter: Payer: Self-pay | Admitting: Family Medicine

## 2021-05-14 VITALS — BP 144/56 | HR 62 | Ht 64.0 in | Wt 153.0 lb

## 2021-05-14 DIAGNOSIS — F419 Anxiety disorder, unspecified: Secondary | ICD-10-CM

## 2021-05-14 DIAGNOSIS — E7800 Pure hypercholesterolemia, unspecified: Secondary | ICD-10-CM | POA: Diagnosis not present

## 2021-05-14 DIAGNOSIS — I10 Essential (primary) hypertension: Secondary | ICD-10-CM

## 2021-05-14 DIAGNOSIS — F5105 Insomnia due to other mental disorder: Secondary | ICD-10-CM

## 2021-05-14 DIAGNOSIS — E871 Hypo-osmolality and hyponatremia: Secondary | ICD-10-CM | POA: Diagnosis not present

## 2021-05-14 NOTE — Progress Notes (Signed)
? ?______________________________________________________________________ ? ?HPI ?[name] is a 88 y.o. female presenting to [facility] today to establish care. Changed prioviders to find somewhere closer to home.  ? ?Patient Care Team: ?Terrilyn Saver, NP as PCP - General (Family Medicine) ?Jerline Pain, MD as PCP - Cardiology (Cardiology) ? ?Health Maintenance  ?Topic Date Due  ? DEXA scan (bone density measurement)  Never done  ? Zoster (Shingles) Vaccine (2 of 2) 02/09/2019  ? COVID-19 Vaccine (4 - Booster for Pfizer series) 02/06/2020  ? Tetanus Vaccine  03/19/2027  ? Pneumonia Vaccine  Completed  ? Flu Shot  Completed  ? HPV Vaccine  Aged Out  ? ? ? ?Concerns today: ? ?HYPERTENSION: amlodipine and lisinopril; no complaints or concerns  ?HYPERLIPIDEMIA: atorvastatin; no complaints of concerns  ? ?ANXIETY - takes a half tablet of tranxene 7.5 mg as needed for anxiety. She is aware of risk factors of taking this medication given her age. States she has not had any side effects. She does feel herself get anxious and tense occasionally during the day (will find herself clinching her jaw) and has increased anxiety around bedtime ever since her husband past. She would prefer not to be on a daily medication for depression/anxiety. States she is not depressed at all. Denies any SI/HI. Just wanted to discuss safety of being on this medicine.  ? ?Reports she has been feeling more warm lately, no other symptoms. States she has been drinking about 3 cups of coffee per day and is wondering if cutting back may help her relax and not feel so warm/flushed during the day.  ? ?LOW SODIUM: 131 on 05/07/21; denies any symptoms or recent medication/diet changes. Otherwise, labs done at prior PCP last month were stable,  ? ? ? ? ?Patient Active Problem List  ? Diagnosis Date Noted  ? Aortic valve sclerosis 02/24/2021  ? Varicose veins of both lower extremities 02/24/2021  ? Left knee pain  04/28/2015  ? Atherosclerosis of native coronary artery of native heart without angina pectoris 11/22/2013  ? Essential hypertension 11/22/2013  ? Pure hypercholesterolemia 11/22/2013  ? Inversion, nipple 09/05/2013  ? Shingles   ? Elevated cholesterol   ? Cancer [facility])   ? Right knee pain 05/10/2011  ? ? ?PHQ9 Today: ?Depression screen [facility] 2/9 05/14/2021  ?Decreased Interest 0  ?Down, Depressed, Hopeless 0  ?PHQ - 2 Score 0  ? ?GAD7 Today: ?No flowsheet data found. ?______________________________________________________________________ ?PMH ?Past Medical History:  ?Diagnosis Date  ? Aortic valve sclerosis   ? CAD (coronary artery disease)   ? S/p DES to the proximal RCA, POBA to the distal RCA 8/08 //Myoview 2009 low risk // Myoview 9/22: EF 57, no ischemia or infarction, apical thinning artifact, low risk study  ? DUB (dysfunctional uterine bleeding)   ? Elevated cholesterol   ? Atorvastatin  ? GERD (gastroesophageal reflux disease)   ? History of echocardiogram   ? EF 60-65, GRII DD, GLS -18.4, normal RVSF, moderate MAC, moderate AV calcification, trivial AI  ? Hypertension   ? Plantar fasciitis   ? Shingles   ? ? ?ROS ?All review of systems negative except what is listed in the HPI ? ?PHYSICAL EXAM ?Physical Exam ?Vitals reviewed.  ?Constitutional:   ?   Appearance: Normal appearance.  ?Cardiovascular:  ?   Rate and Rhythm: Normal rate and regular rhythm.  ?   Heart sounds: Murmur heard.  ?Pulmonary:  ?   Effort: Pulmonary effort  is normal.  ?   Breath sounds: Normal breath sounds.  ?Skin: ?   General: Skin is warm and dry.  ?   Capillary Refill: Capillary refill takes less than 2 seconds.  ?Neurological:  ?   General: No focal deficit present.  ?   Mental Status: She is alert and oriented to person, place, and time. Mental status is at baseline.  ?Psychiatric:     ?   Mood and Affect: Mood normal.     ?   Behavior: Behavior normal.     ?   Thought Content: Thought content normal.     ?   Judgment: Judgment normal.   ? ?______________________________________________________________________ ?ASSESSMENT AND PLAN ? ?1. Hyponatremia ?Asymptomatic. Recheck labs in about 2 weeks to ensure return to baseline.  ?- Basic metabolic panel; Future ? ?2. Essential hypertension ?Stable. No changes. Continue healthy diet, daily medications, and routine physical activity.  ? ?3. Elevated cholesterol ?Stable at last check. Continue medications, healthy diet and exercise. Recheck in 6 months.  ? ?4. Insomnia secondary to anxiety ?Discussed risk vs benefit of Tranxene - given her age, may be more risk to withdraw and try new medication, compared to continuing with only occasional use of half tab given she has been doing this for decades. She is aware to only take it when needed and monitor for adverse effects. Not making any changes today. Recommend reducing caffeine intake as well.  ? ?Establish care ?Education provided today during visit and on AVS for patient to review at home.  ?Diet and Exercise recommendations provided.  ?Current diagnoses and recommendations discussed. ?HM recommendations reviewed with recommendations.  ? ? ?Outpatient Encounter Medications as of 05/14/2021  ?Medication Sig  ? amLODipine (NORVASC) 5 MG tablet Take 1 tablet (5 mg total) by mouth daily.  ? Ascorbic Acid (VITAMIN C) 1000 MG tablet Take 1,000 mg by mouth daily.  ? aspirin 81 MG tablet Take 81 mg by mouth daily.    ? atorvastatin (LIPITOR) 40 MG tablet Take 40 mg by mouth daily.   ? cetirizine (ZYRTEC) 10 MG tablet Take 10 mg by mouth daily.    ? Cholecalciferol (VITAMIN D) 2000 units CAPS Take 2,000 Units by mouth daily.  ? clorazepate (TRANXENE) 7.5 MG tablet Take 7.5 mg by mouth once as needed. For nerves  ? Coenzyme Q10 (CO Q 10 PO) Take 1 tablet by mouth every morning.  ? diclofenac Sodium (VOLTAREN) 1 % GEL Apply 4 g topically 4 (four) times daily.  ? lisinopril (PRINIVIL,ZESTRIL) 40 MG tablet Take 40 mg by mouth daily.  ? nitroGLYCERIN (NITROSTAT) 0.4 MG  SL tablet Place 1 tablet (0.4 mg total) under the tongue every 5 (five) minutes as needed for chest pain.  ? pantoprazole (PROTONIX) 40 MG tablet Take 40 mg by mouth daily.   ? [DISCONTINUED] influenza vaccine adjuvanted (FLUAD) 0.5 ML injection Inject into the muscle.  ? [DISCONTINUED] isosorbide mononitrate (IMDUR) 30 MG 24 hr tablet Take 0.5 tablets (15 mg total) by mouth daily.  ? [DISCONTINUED] meloxicam (MOBIC) 15 MG tablet Take 1 tablet (15 mg total) by mouth daily as needed for pain.  ? ?No facility-administered encounter medications on file as of 05/14/2021.  ? ? ?Return in about 6 months (around 11/14/2021) for routine follow-up with labs; lab appointment in 2 weeks . ? ?I spent 30 minutes dedicated to the care of this patient on the date of this encounter to include pre-visit chart review of prior notes and results, face-to-face time  with the patient, and post-visit ordering of testing as indicated.  ? ? ?[facility] N[facility] Olevia Bowens, DNP, FNP-C ? ? ?

## 2021-05-14 NOTE — Patient Instructions (Signed)
Thank you for choosing [facility] at [facility] for your Primary Care needs. I am excited for the opportunity to partner with you to meet your health care goals. It was a pleasure meeting you today!    Information on diet, exercise, and health maintenance recommendations are listed below. This is information to help you be sure you are on track for optimal health and monitoring.   Please look over this and let us know if you have any questions or if you have completed any of the health maintenance outside of [place] so that we can be sure your records are up to date.  ___________________________________________________________  MyChart:  For all urgent or time sensitive needs we ask that you please call the office to avoid delays. Our number is [phone]. MyChart is not constantly monitored and due to the large volume of messages a day, replies may take up to 72 business hours.  MyChart Policy: MyChart allows for you to see your visit notes, after visit summary, provider recommendations, lab and tests results, make an appointment, request refills, and contact your provider or the office for non-urgent questions or concerns. Providers are seeing patients during normal business hours and do not have built in time to review MyChart messages.  We ask that you allow a minimum of 3 business days for responses to [employer]. For this reason, please do not send urgent requests through [place]. Please call the office at [phone]. New and ongoing conditions may require a visit. We have virtual and in-person visits available for your convenience.  Complex MyChart concerns may require a visit. Your provider may request you schedule a virtual or in-person visit to ensure we are providing the best care possible. MyChart messages sent after 11:00 AM on Friday will not be received by the provider until Monday morning.    Lab and Test Results: You will receive your lab and  test results on MyChart as soon as they are completed and results have been sent by the lab or testing facility. Due to this service, you will receive your results BEFORE your provider.  I review lab and test results each morning prior to seeing patients. Some results require collaboration with other providers to ensure you are receiving the most appropriate care. For this reason, we ask that you please allow a minimum of 3-5 business days from the time that ALL results have been received for your provider to receive and review lab and test results and contact you about these.  Most lab and test result comments from the provider will be sent through [place]. Your provider may recommend changes to the plan of care, follow-up visits, repeat testing, ask questions, or request an office visit to discuss these results. You may reply directly to this message or call the office to provide information for the provider or set up an appointment. In some instances, you will be called with test results and recommendations. Please let us know if this is preferred and we will make note of this in your chart to provide this for you.    If you have not heard a response to your lab or test results in 5 business days from all results returning to [place], please call the office to let us know. We ask that you please avoid calling prior to this time unless there is an emergent concern. Due to high call volumes, this can delay the resulting process.  After Hours: For all non-emergency after hours  needs, please call the office at [phone] and select the option to reach the on-call  service. On-call services are shared between multiple [place] offices and therefore it will not be possible to speak directly with your provider. On-call providers may provide medical advice and recommendations, but are unable to provide refills for maintenance medications.  For all emergency or urgent medical needs after normal business  hours, we recommend that you seek care at the closest Urgent Care or Emergency Department to ensure appropriate treatment in a timely manner.  [facility] [place] has a 24 hour emergency room located on the ground floor for your convenience.   Urgent Concerns During the Business Day Providers are seeing patients from 8AM to [place] with a busy schedule and are most often not able to respond to non-urgent calls until the end of the day or the next business day. If you should have URGENT concerns during the day, please call and speak to the nurse or schedule a same day appointment so that we can address your concern without delay.   Thank you, again, for choosing me as your health care partner. I appreciate your trust and look forward to learning more about you.   Pur[facility] Nails Olevia Bowens, DNP, FNP-C  ___________________________________________________________  Health Maintenance Recommendations Screening Testing Mammogram Every 1-2 years based on history and risk factors Starting at age 79 Pap Smear Ages 21-39 every 3 years Ages 26-65 every 5 years with HPV testing More frequent testing may be required based on results and history Colon Cancer Screening Every 1-10 years based on test performed, risk factors, and history Starting at age 65 Bone Density Screening Every 2-10 years based on history Starting at age 62 for women Recommendations for men differ based on medication usage, history, and risk factors AAA Screening One time ultrasound Men 51-28 years old who have ever smoked Lung Cancer Screening Low Dose Lung CT every 12 months Age 26-80 years with a 20 pack-year smoking history who still smoke or who have quit within the last 15 years  Screening Labs Routine  Labs: Complete Blood Count (CBC), Complete Metabolic Panel (CMP), Cholesterol (Lipid Panel) Every 6-12 months based on history and medications May be recommended more frequently based on current conditions or  previous results Hemoglobin A1c Lab Every 3-12 months based on history and previous results Starting at age 6 or earlier with diagnosis of diabetes, high cholesterol, BMI >26, and/or risk factors Frequent monitoring for patients with diabetes to ensure blood sugar control Thyroid Panel (TSH w/ T3 & T4) Every 6 months based on history, symptoms, and risk factors May be repeated more often if on medication HIV One time testing for all patients 11 and older May be repeated more frequently for patients with increased risk factors or exposure Hepatitis C One time testing for all patients 81 and older May be repeated more frequently for patients with increased risk factors or exposure Gonorrhea, Chlamydia Every 12 months for all sexually active persons 13-24 years Additional monitoring may be recommended for those who are considered high risk or who have symptoms PSA Men 72-68 years old with risk factors Additional screening may be recommended from age 81-69 based on risk factors, symptoms, and history  Vaccine Recommendations Tetanus Booster All adults every 10 years Flu Vaccine All patients 6 months and older every year COVID Vaccine All patients 12 years and older Initial dosing with booster May recommend additional booster based on age and health history HPV Vaccine 2 doses all  patients age 77-26 Dosing may be considered for patients over 26 Shingles Vaccine (Shingrix) 2 doses all adults 25 years and older Pneumonia (Pneumovax 52) All adults 60 years and older May recommend earlier dosing based on health history Pneumonia (Prevnar 60) All adults 68 years and older Dosed 1 year after Pneumovax 23 Pneumonia (Prevnar 56) All adults 59 years and older (adults 10-93 with certain conditions or risk factors) 1 dose  For those who have no received Prevnar 13 vaccine previously   Additional Screening, Testing, and Vaccinations may be recommended on an individualized basis based on  family history, health history, risk factors, and/or exposure.  __________________________________________________________  Diet Recommendations for All Patients  I recommend that all patients maintain a diet low in saturated fats, carbohydrates, and cholesterol. While this can be challenging at first, it is not impossible and small changes can make big differences.  Things to try: Decreasing the amount of soda, sweet tea, and/or juice to one or less per day and replace with water While water is always the first choice, if you do not like water you may consider adding a water additive without sugar to improve the taste other sugar free drinks Replace potatoes with a brightly colored vegetable at dinner Use healthy oils, such as canola oil or olive oil, instead of butter or hard margarine Limit your bread intake to two pieces or less a day Replace regular pasta with low carb pasta options Bake, broil, or grill foods instead of frying Monitor portion sizes  Eat smaller, more frequent meals throughout the day instead of large meals  An important thing to remember is, if you love foods that are not great for your health, you don't have to give them up completely. Instead, allow these foods to be a reward when you have done well. Allowing yourself to still have special treats every once in a while is a nice way to tell yourself thank you for working hard to keep yourself healthy.   Also remember that every day is a new day. If you have a bad day and "fall off the wagon", you can still climb right back up and keep moving along on your journey!  We have resources available to help you!  Some websites that may be helpful include: www.http://carter.biz/  Www.VeryWellFit.com _____________________________________________________________  Activity Recommendations for All Patients  I recommend that all adults get at least 20 minutes of moderate physical activity that elevates your heart rate at least 5  days out of the week.  Some examples include: Walking or jogging at a pace that allows you to carry on a conversation Cycling (stationary bike or outdoors) Water aerobics Yoga Weight lifting Dancing If physical limitations prevent you from putting stress on your joints, exercise in a pool or seated in a chair are excellent options.  Do determine your MAXIMUM heart rate for activity: YOUR AGE - 220 = MAX HeartRate   Remember! Do not push yourself too hard.  Start slowly and build up your pace, speed, weight, time in exercise, etc.  Allow your body to rest between exercise and get good sleep. You will need more water than normal when you are exerting yourself. Do not wait until you are thirsty to drink. Drink with a purpose of getting in at least 8, 8 ounce glasses of water a day plus more depending on how much you exercise and sweat.    If you begin to develop dizziness, chest pain, abdominal pain, jaw pain, shortness of breath, headache,  vision changes, lightheadedness, or other concerning symptoms, stop the activity and allow your body to rest. If your symptoms are severe, seek emergency evaluation immediately. If your symptoms are concerning, but not severe, please let us know so that we can recommend further evaluation.

## 2021-05-18 ENCOUNTER — Ambulatory Visit: Payer: Medicare HMO | Attending: Orthopaedic Surgery | Admitting: Physical Therapy

## 2021-05-18 ENCOUNTER — Other Ambulatory Visit: Payer: Self-pay

## 2021-05-18 ENCOUNTER — Encounter: Payer: Self-pay | Admitting: Physical Therapy

## 2021-05-18 DIAGNOSIS — M25561 Pain in right knee: Secondary | ICD-10-CM | POA: Insufficient documentation

## 2021-05-18 DIAGNOSIS — R296 Repeated falls: Secondary | ICD-10-CM | POA: Diagnosis present

## 2021-05-18 DIAGNOSIS — R262 Difficulty in walking, not elsewhere classified: Secondary | ICD-10-CM | POA: Insufficient documentation

## 2021-05-18 DIAGNOSIS — G8929 Other chronic pain: Secondary | ICD-10-CM | POA: Insufficient documentation

## 2021-05-18 DIAGNOSIS — M25562 Pain in left knee: Secondary | ICD-10-CM | POA: Diagnosis present

## 2021-05-18 NOTE — Therapy (Signed)
[facility] ?[address] ?[place], [place], [address] ?Phone: [phone]   Fax:  [phone] ? ?Physical Therapy Treatment ? ?Patient Details  ?Name: [name] ?MRN: [id number] ?Date of Birth: 1927/01/03 ?Referring Provider (PT): Ninfa Linden ? ? ?Encounter Date: 05/18/2021 ? ? PT End of Session - 05/18/21 1507   ? ? Visit Number 11   ? Date for PT Re-Evaluation 07/05/21   ? Authorization Type [employer] Medicare   ? PT Start Time 1430   ? PT Stop Time 6270   ? PT Time Calculation (min) 41 min   ? Activity Tolerance Patient tolerated treatment well   ? Behavior During Therapy [facility] for tasks assessed/performed   ? ?  ?  ? ?  ? ? ?Past Medical History:  ?Diagnosis Date  ? Aortic valve sclerosis   ? CAD (coronary artery disease)   ? S/p DES to the proximal RCA, POBA to the distal RCA 8/08 //Myoview 2009 low risk // Myoview 9/22: EF 57, no ischemia or infarction, apical thinning artifact, low risk study  ? DUB (dysfunctional uterine bleeding)   ? Elevated cholesterol   ? Atorvastatin  ? GERD (gastroesophageal reflux disease)   ? History of echocardiogram   ? EF 60-65, GRII DD, GLS -18.4, normal RVSF, moderate MAC, moderate AV calcification, trivial AI  ? Hypertension   ? Plantar fasciitis   ? Shingles   ? ? ?Past Surgical History:  ?Procedure Laterality Date  ? APPENDECTOMY  2011  ? CARDIAC STINT IMPLANT  2008  ? CARPAL TUNNEL RELEASE    ? EYE SURGERY    ? INGUINAL HERNIA REPAIR    ? OOPHORECTOMY  2001  ? LAVH,BSO  ? ULNA NERVE RELEASE  2009  ? VAGINAL HYSTERECTOMY  2001  ? LAVH, BSO  ? ? ?There were no vitals filed for this visit. ? ? Subjective Assessment - 05/18/21 1433   ? ? Subjective My legs are still weak, I need to work on leg weakness.   ? Currently in Pain? No/denies   ? ?  ?  ? ?  ? ? ? ? ? ? ? ? ? ? ? ? ? ? ? ? ? ? ? ? Gross Adult PT Treatment/Exercise - 05/18/21 0001   ? ?  ? Knee/Hip Exercises: Aerobic  ? Nustep level 5 x 6 minutes   ?  ? Knee/Hip Exercises:  Machines for Strengthening  ? Cybex Knee Extension 10lb x10, SL 5lb x 10 each   ? Cybex Knee Flexion 25lb x10, SL 15lb x 10 each   ? Cybex Leg Press 20lb 2x10   ?  ? Knee/Hip Exercises: Standing  ? Other Standing Knee Exercises Standing march 3x10   ? Other Standing Knee Exercises red tband row and extension for core   ?  ? Knee/Hip Exercises: Seated  ? Sit to Sand 2 sets;5 reps;without UE support   elevated surface  ? ?  ?  ? ?  ? ? ? ? ? ? ? ? ? ? ? ? PT Short Term Goals - 04/08/21 1148   ? ?  ? PT SHORT TERM GOAL #1  ? Title independent with initial HEP   ? Status Partially Met   ? ?  ?  ? ?  ? ? ? ? PT Long Term Goals - 05/06/21 1430   ? ?  ? PT LONG TERM GOAL #1  ? Title understand proper posture and body mechanics   ?  Status Partially Met   ?  ? PT LONG TERM GOAL #2  ? Title decrease TUG time to 14 seconds   ? Status Achieved   ?  ? PT LONG TERM GOAL #3  ? Title increase berg blance score to 46/56   ? Status On-going   ? ?  ?  ? ?  ? ? ? ? ? ? ? ? Plan - 05/18/21 1508   ? ? Clinical Impression Statement Pt enters clinic stating she needed to work on [employer] she did well progressing with machine level interventions. Some bilateral LE fatigue and burning reported with all SL strengthening activities. Cues for core engagement needed with standing rows and extensions. No reports of pain throughout session.   ? Stability/Clinical Decision Making Stable/Uncomplicated   ? Rehab Potential Good   ? PT Frequency 2x / week   ? PT Duration 12 weeks   ? PT Treatment/Interventions ADLs/Self Care Home Management;Gait training;Stair training;Functional mobility training;Therapeutic activities;Therapeutic exercise;Balance training;Neuromuscular re-education;Manual techniques;Patient/family education   ? PT Next Visit Plan work on balance   ? ?  ?  ? ?  ? ? ?Patient will benefit from skilled therapeutic intervention in order to improve the following deficits and impairments:  Abnormal gait, Decreased range of motion,  Difficulty walking, Pain, Decreased balance, Improper body mechanics, Postural dysfunction, Decreased strength, Decreased mobility ? ?Visit Diagnosis: ?Difficulty in walking, not elsewhere classified ? ?Chronic pain of left knee ? ?Chronic pain of right knee ? ?Repeated falls ? ? ? ? ?Problem List ?Patient Active Problem List  ? Diagnosis Date Noted  ? Aortic valve sclerosis 02/24/2021  ? Varicose veins of both lower extremities 02/24/2021  ? Left knee pain 04/28/2015  ? Atherosclerosis of native coronary artery of native heart without angina pectoris 11/22/2013  ? Essential hypertension 11/22/2013  ? Pure hypercholesterolemia 11/22/2013  ? Inversion, nipple 09/05/2013  ? Shingles   ? Elevated cholesterol   ? Cancer [facility])   ? Right knee pain 05/10/2011  ? ? ?Scot Jun, PTA ?05/18/2021, 3:11 PM ? ?[place] ?[address] ?[address] ?[place], [place], [address] ?Phone: [phone]   Fax:  [phone] ? ?Name: [name] ?MRN: [id number] ?Date of Birth: 1926/12/31 ? ? ? ?

## 2021-05-20 ENCOUNTER — Encounter: Payer: Self-pay | Admitting: Physical Therapy

## 2021-05-20 ENCOUNTER — Other Ambulatory Visit: Payer: Self-pay

## 2021-05-20 ENCOUNTER — Ambulatory Visit: Payer: Medicare HMO | Admitting: Physical Therapy

## 2021-05-20 DIAGNOSIS — G8929 Other chronic pain: Secondary | ICD-10-CM

## 2021-05-20 DIAGNOSIS — M25561 Pain in right knee: Secondary | ICD-10-CM

## 2021-05-20 DIAGNOSIS — R262 Difficulty in walking, not elsewhere classified: Secondary | ICD-10-CM

## 2021-05-20 DIAGNOSIS — M25562 Pain in left knee: Secondary | ICD-10-CM

## 2021-05-20 DIAGNOSIS — R296 Repeated falls: Secondary | ICD-10-CM

## 2021-05-20 NOTE — Therapy (Signed)
[facility] ?[place], [place], [address] ?Phone: [phone]   Fax:  [phone] ? ?Physical Therapy Treatment ? ?Patient Details  ?Name: [name] ?MRN: [id number] ?Date of Birth: Sep 15, 1926 ?Referring Provider (PT): Ninfa Linden ? ? ?Encounter Date: 05/20/2021 ? ? PT End of Session - 05/20/21 1225   ? ? Visit Number 12   ? Date for PT Re-Evaluation 07/05/21   ? Authorization Type Aetna Medicare   ? PT Start Time 1145   ? PT Stop Time 1228   ? PT Time Calculation (min) 43 min   ? Activity Tolerance Patient tolerated treatment well   ? Behavior During Therapy [facility] for tasks assessed/performed   ? ?  ?  ? ?  ? ? ?Past Medical History:  ?Diagnosis Date  ? Aortic valve sclerosis   ? CAD (coronary artery disease)   ? S/p DES to the proximal RCA, POBA to the distal RCA 8/08 //Myoview 2009 low risk // Myoview 9/22: EF 57, no ischemia or infarction, apical thinning artifact, low risk study  ? DUB (dysfunctional uterine bleeding)   ? Elevated cholesterol   ? Atorvastatin  ? GERD (gastroesophageal reflux disease)   ? History of echocardiogram   ? EF 60-65, GRII DD, GLS -18.4, normal RVSF, moderate MAC, moderate AV calcification, trivial AI  ? Hypertension   ? Plantar fasciitis   ? Shingles   ? ? ?Past Surgical History:  ?Procedure Laterality Date  ? APPENDECTOMY  2011  ? CARDIAC STINT IMPLANT  2008  ? CARPAL TUNNEL RELEASE    ? EYE SURGERY    ? INGUINAL HERNIA REPAIR    ? OOPHORECTOMY  2001  ? LAVH,BSO  ? ULNA NERVE RELEASE  2009  ? VAGINAL HYSTERECTOMY  2001  ? LAVH, BSO  ? ? ?There were no vitals filed for this visit. ? ? Subjective Assessment - 05/20/21 1141   ? ? Subjective "I think a little bit better" "I think Im walking better"   ? Currently in Pain? No/denies   ? ?  ?  ? ?  ? ? ? ? ? ? ? ? ? ? ? ? ? ? ? ? ? ? ? ? [facility] Adult PT Treatment/Exercise - 05/20/21 0001   ? ?  ? Knee/Hip Exercises: Aerobic  ? Nustep level 5 x 6 minutes   ?  ? Knee/Hip Exercises:  Machines for Strengthening  ? Cybex Knee Extension 10lb x10, SL 5lb x 10 each   ? Cybex Knee Flexion 25lb 2x10, SL 15lb 2x 5 each   ? Cybex Leg Press 20lb 2x10   ?  ? Knee/Hip Exercises: Standing  ? Forward Step Up Both;2 sets;5 reps;Hand Hold: 0;Step Height: 4"   ? Walking with Sports Cord 20lb 4 way x 3 each   ?  ? Knee/Hip Exercises: Seated  ? Sit to Sand 3 sets;5 reps;without UE support   elevated mat  ? ?  ?  ? ?  ? ? ? ? ? ? ? ? ? ? ? ? PT Short Term Goals - 05/20/21 1225   ? ?  ? PT SHORT TERM GOAL #1  ? Title independent with initial HEP   ? Status Achieved   ? ?  ?  ? ?  ? ? ? ? PT Long Term Goals - 05/20/21 1225   ? ?  ? PT LONG TERM GOAL #1  ? Title understand proper posture and body mechanics   ?  Status Partially Met   ?  ? PT LONG TERM GOAL #2  ? Title decrease TUG time to 14 seconds   ? Status Achieved   ? ?  ?  ? ?  ? ? ? ? ? ? ? ? Plan - 05/20/21 1226   ? ? Clinical Impression Statement Pt able to complete all of the interventions during today's session. Pt demo ed good carryover from last session with machine level interventions. Pt able to progress to step ups without UE use. Pt would occasionally put 3/4 of her foot on box with step ups causing some instability, but able to correct cues. increase fatigue noted with resisted gait.   ? Stability/Clinical Decision Making Stable/Uncomplicated   ? Rehab Potential Good   ? PT Frequency 2x / week   ? PT Duration 12 weeks   ? PT Treatment/Interventions ADLs/Self Care Home Management;Gait training;Stair training;Functional mobility training;Therapeutic activities;Therapeutic exercise;Balance training;Neuromuscular re-education;Manual techniques;Patient/family education   ? PT Next Visit Plan work on balance   ? ?  ?  ? ?  ? ? ?Patient will benefit from skilled therapeutic intervention in order to improve the following deficits and impairments:  Abnormal gait, Decreased range of motion, Difficulty walking, Pain, Decreased balance, Improper body mechanics,  Postural dysfunction, Decreased strength, Decreased mobility ? ?Visit Diagnosis: ?Difficulty in walking, not elsewhere classified ? ?Chronic pain of left knee ? ?Chronic pain of right knee ? ?Repeated falls ? ? ? ? ?Problem List ?Patient Active Problem List  ? Diagnosis Date Noted  ? Aortic valve sclerosis 02/24/2021  ? Varicose veins of both lower extremities 02/24/2021  ? Left knee pain 04/28/2015  ? Atherosclerosis of native coronary artery of native heart without angina pectoris 11/22/2013  ? Essential hypertension 11/22/2013  ? Pure hypercholesterolemia 11/22/2013  ? Inversion, nipple 09/05/2013  ? Shingles   ? Elevated cholesterol   ? Cancer [facility])   ? Right knee pain 05/10/2011  ? ? ?Scot Jun, PTA ?05/20/2021, 12:32 PM ? ?[place] ?[address] ?[place], [place], [address] ?Phone: [phone]   Fax:  [phone] ? ?Name: [name] ?MRN: [id number] ?Date of Birth: June 12, 1926 ? ? ? ?

## 2021-05-25 ENCOUNTER — Encounter: Payer: Self-pay | Admitting: Physical Therapy

## 2021-05-25 ENCOUNTER — Ambulatory Visit: Payer: Medicare HMO | Admitting: Physical Therapy

## 2021-05-25 ENCOUNTER — Other Ambulatory Visit: Payer: Self-pay

## 2021-05-25 DIAGNOSIS — M25562 Pain in left knee: Secondary | ICD-10-CM

## 2021-05-25 DIAGNOSIS — G8929 Other chronic pain: Secondary | ICD-10-CM

## 2021-05-25 DIAGNOSIS — M25561 Pain in right knee: Secondary | ICD-10-CM

## 2021-05-25 DIAGNOSIS — R262 Difficulty in walking, not elsewhere classified: Secondary | ICD-10-CM | POA: Diagnosis not present

## 2021-05-25 DIAGNOSIS — R296 Repeated falls: Secondary | ICD-10-CM

## 2021-05-25 NOTE — Therapy (Signed)
[facility] [address] ?[place], [place], [address] ?Phone: [phone]   Fax:  [phone] ? ?Physical Therapy Treatment ? ?Patient Details  ?Name: [name] ?MRN: [id number] ?Date of Birth: 1926/05/04 ?Referring Provider (PT): Ninfa Linden ? ? ?Encounter Date: 05/25/2021 ? ? PT End of Session - 05/25/21 1222   ? ? Visit Number 13   ? Date for PT Re-Evaluation 07/05/21   ? Authorization Type Aetna Medicare   ? PT Start Time 1145   ? PT Stop Time 1225   ? PT Time Calculation (min) 40 min   ? Activity Tolerance Patient tolerated treatment well   ? Behavior During Therapy [facility] for tasks assessed/performed   ? ?  ?  ? ?  ? ? ?Past Medical History:  ?Diagnosis Date  ? Aortic valve sclerosis   ? CAD (coronary artery disease)   ? S/p DES to the proximal RCA, POBA to the distal RCA 8/08 //Myoview 2009 low risk // Myoview 9/22: EF 57, no ischemia or infarction, apical thinning artifact, low risk study  ? DUB (dysfunctional uterine bleeding)   ? Elevated cholesterol   ? Atorvastatin  ? GERD (gastroesophageal reflux disease)   ? History of echocardiogram   ? EF 60-65, GRII DD, GLS -18.4, normal RVSF, moderate MAC, moderate AV calcification, trivial AI  ? Hypertension   ? Plantar fasciitis   ? Shingles   ? ? ?Past Surgical History:  ?Procedure Laterality Date  ? APPENDECTOMY  2011  ? CARDIAC STINT IMPLANT  2008  ? CARPAL TUNNEL RELEASE    ? EYE SURGERY    ? INGUINAL HERNIA REPAIR    ? OOPHORECTOMY  2001  ? LAVH,BSO  ? ULNA NERVE RELEASE  2009  ? VAGINAL HYSTERECTOMY  2001  ? LAVH, BSO  ? ? ?There were no vitals filed for this visit. ? ? Subjective Assessment - 05/25/21 1147   ? ? Subjective "[name] been better" "I want to walk better"   ? Currently in Pain? Yes   ? Pain Score 5    ? Pain Location Knee   ? Pain Orientation Right   ? ?  ?  ? ?  ? ? ? ? ? ? ? ? ? ? ? ? ? ? ? ? ? ? ? ? [facility] Adult PT Treatment/Exercise - 05/25/21 0001   ? ?  ? Ambulation/Gait  ? Ambulation/Gait Yes    ? Ambulation/Gait Assistance 6: Modified independent (Device/Increase time)   ? Ambulation Distance (Feet) 800 Feet   ? Assistive device None   ? Ambulation Surface Level;Indoor   ?  ? High Level Balance  ? High Level Balance Comments standing ball toss then on airex   ?  ? Knee/Hip Exercises: Aerobic  ? Nustep level 5 x 6 minutes   ?  ? Knee/Hip Exercises: Machines for Strengthening  ? Cybex Knee Extension 10lb 2x10   ? Cybex Knee Flexion 25lb 2x10   ?  ? Knee/Hip Exercises: Standing  ? Other Standing Knee Exercises red tband row and extension for core   ?  ? Knee/Hip Exercises: Seated  ? Ball Squeeze 2x10   ? Sit to Sand 2 sets;5 reps;with UE support   ? ?  ?  ? ?  ? ? ? ? ? ? ? ? ? ? ? ? PT Short Term Goals - 05/20/21 1225   ? ?  ? PT SHORT TERM GOAL #1  ? Title independent with initial HEP   ?  Status Achieved   ? ?  ?  ? ?  ? ? ? ? PT Long Term Goals - 05/25/21 1223   ? ?  ? PT LONG TERM GOAL #1  ? Title understand proper posture and body mechanics   ? Status Partially Met   ?  ? PT LONG TERM GOAL #2  ? Title decrease TUG time to 14 seconds   ? Status Achieved   ?  ? PT LONG TERM GOAL #3  ? Title increase berg blance score to 46/56   ? Status On-going   ?  ? PT LONG TERM GOAL #4  ? Title report no fear of shopping   ? Status Partially Met   ? ?  ?  ? ?  ? ? ? ? ? ? ? ? Plan - 05/25/21 1223   ? ? Clinical Impression Statement Pt did well with all interventions. She was very pleased with her walking. Some LE weakness and fatigue present during the later reps of leg extensions. Pt reports being pleased with her current functional status and report no functional limitations.   ? Stability/Clinical Decision Making Stable/Uncomplicated   ? Rehab Potential Good   ? PT Frequency 2x / week   ? PT Duration 12 weeks   ? PT Treatment/Interventions ADLs/Self Care Home Management;Gait training;Stair training;Functional mobility training;Therapeutic activities;Therapeutic exercise;Balance training;Neuromuscular  re-education;Manual techniques;Patient/family education   ? PT Next Visit Plan D/C PT   ? ?  ?  ? ?  ? ? ?Patient will benefit from skilled therapeutic intervention in order to improve the following deficits and impairments:  Abnormal gait, Decreased range of motion, Difficulty walking, Pain, Decreased balance, Improper body mechanics, Postural dysfunction, Decreased strength, Decreased mobility ? ?Visit Diagnosis: ?Difficulty in walking, not elsewhere classified ? ?Chronic pain of left knee ? ?Chronic pain of right knee ? ?Repeated falls ? ? ? ? ?Problem List ?Patient Active Problem List  ? Diagnosis Date Noted  ? Aortic valve sclerosis 02/24/2021  ? Varicose veins of both lower extremities 02/24/2021  ? Left knee pain 04/28/2015  ? Atherosclerosis of native coronary artery of native heart without angina pectoris 11/22/2013  ? Essential hypertension 11/22/2013  ? Pure hypercholesterolemia 11/22/2013  ? Inversion, nipple 09/05/2013  ? Shingles   ? Elevated cholesterol   ? Cancer [facility])   ? Right knee pain 05/10/2011  ? ?PHYSICAL THERAPY DISCHARGE SUMMARY ? ?Visits from Start of Care: 13 ? ?  ? ?Patient agrees to discharge. Patient goals were partially met. Patient is being discharged due to being pleased with the current functional level. ? ? ?Scot Jun, PTA ?05/25/2021, 12:27 PM ? ?[place] ?[address] ?[place], [place], [address] ?Phone: [phone]   Fax:  [phone] ? ?Name: [name] ?MRN: [id number] ?Date of Birth: Jul 16, 1926 ? ? ? ?

## 2021-05-27 ENCOUNTER — Ambulatory Visit: Payer: Medicare HMO | Admitting: Physical Therapy

## 2021-05-28 ENCOUNTER — Other Ambulatory Visit (INDEPENDENT_AMBULATORY_CARE_PROVIDER_SITE_OTHER): Payer: Medicare HMO

## 2021-05-28 DIAGNOSIS — E871 Hypo-osmolality and hyponatremia: Secondary | ICD-10-CM

## 2021-05-28 LAB — BASIC METABOLIC PANEL WITH GFR
BUN: 19 mg/dL (ref 6–23)
CO2: 28 meq/L (ref 19–32)
Calcium: 9 mg/dL (ref 8.4–10.5)
Chloride: 95 meq/L — ABNORMAL LOW (ref 96–112)
Creatinine, Ser: 0.75 mg/dL (ref 0.40–1.20)
GFR: 69.03 mL/min
Glucose, Bld: 58 mg/dL — ABNORMAL LOW (ref 70–99)
Potassium: 4.6 meq/L (ref 3.5–5.1)
Sodium: 129 meq/L — ABNORMAL LOW (ref 135–145)

## 2021-05-29 NOTE — Addendum Note (Signed)
Addended by: Caleen Jobs B on: 05/29/2021 08:26 AM ? ? Modules accepted: Orders ? ?

## 2021-06-01 ENCOUNTER — Ambulatory Visit: Payer: Medicare HMO | Admitting: Physical Therapy

## 2021-06-02 ENCOUNTER — Other Ambulatory Visit (INDEPENDENT_AMBULATORY_CARE_PROVIDER_SITE_OTHER): Payer: Medicare HMO

## 2021-06-02 DIAGNOSIS — E871 Hypo-osmolality and hyponatremia: Secondary | ICD-10-CM | POA: Diagnosis not present

## 2021-06-03 ENCOUNTER — Ambulatory Visit: Payer: Medicare HMO | Admitting: Physical Therapy

## 2021-06-03 LAB — COMPREHENSIVE METABOLIC PANEL
ALT: 24 U/L (ref 0–35)
AST: 24 U/L (ref 0–37)
Albumin: 4.1 g/dL (ref 3.5–5.2)
Alkaline Phosphatase: 79 U/L (ref 39–117)
BUN: 23 mg/dL (ref 6–23)
CO2: 29 mEq/L (ref 19–32)
Calcium: 9.2 mg/dL (ref 8.4–10.5)
Chloride: 97 mEq/L (ref 96–112)
Creatinine, Ser: 0.85 mg/dL (ref 0.40–1.20)
GFR: 59.4 mL/min — ABNORMAL LOW (ref >60.00)
Glucose, Bld: 83 mg/dL (ref 70–99)
Potassium: 5.3 mEq/L — ABNORMAL HIGH (ref 3.5–5.1)
Sodium: 132 mEq/L — ABNORMAL LOW (ref 135–145)
Total Bilirubin: 0.6 mg/dL (ref 0.2–1.2)
Total Protein: 6.1 g/dL (ref 6.0–8.3)

## 2021-06-04 LAB — OSMOLALITY: Osmolality: 283 mOsm/kg (ref 278–305)

## 2021-06-05 NOTE — Addendum Note (Signed)
Addended by: Caleen Jobs B on: 06/05/2021 01:03 PM ? ? Modules accepted: Orders ? ?

## 2021-06-08 ENCOUNTER — Ambulatory Visit: Payer: Medicare HMO | Admitting: Physical Therapy

## 2021-06-09 ENCOUNTER — Ambulatory Visit: Payer: Medicare HMO | Admitting: Family

## 2021-06-10 ENCOUNTER — Other Ambulatory Visit (INDEPENDENT_AMBULATORY_CARE_PROVIDER_SITE_OTHER): Payer: Medicare HMO

## 2021-06-10 ENCOUNTER — Other Ambulatory Visit: Payer: Medicare HMO

## 2021-06-10 ENCOUNTER — Ambulatory Visit: Payer: Medicare HMO | Admitting: Physical Therapy

## 2021-06-10 DIAGNOSIS — E871 Hypo-osmolality and hyponatremia: Secondary | ICD-10-CM

## 2021-06-10 LAB — BASIC METABOLIC PANEL
BUN: 19 mg/dL (ref 6–23)
CO2: 27 mEq/L (ref 19–32)
Calcium: 9.5 mg/dL (ref 8.4–10.5)
Chloride: 99 mEq/L (ref 96–112)
Creatinine, Ser: 0.84 mg/dL (ref 0.40–1.20)
GFR: 60.24 mL/min (ref >60.00)
Glucose, Bld: 78 mg/dL (ref 70–99)
Potassium: 4.3 mEq/L (ref 3.5–5.1)
Sodium: 135 mEq/L (ref 135–145)

## 2021-06-29 ENCOUNTER — Ambulatory Visit (INDEPENDENT_AMBULATORY_CARE_PROVIDER_SITE_OTHER): Payer: Medicare HMO | Admitting: Orthopaedic Surgery

## 2021-06-29 DIAGNOSIS — M25562 Pain in left knee: Secondary | ICD-10-CM

## 2021-06-29 DIAGNOSIS — M25561 Pain in right knee: Secondary | ICD-10-CM | POA: Diagnosis not present

## 2021-06-29 DIAGNOSIS — G8929 Other chronic pain: Secondary | ICD-10-CM | POA: Diagnosis not present

## 2021-06-29 MED ORDER — METHYLPREDNISOLONE ACETATE 40 MG/ML IJ SUSP
40.0000 mg | INTRAMUSCULAR | Status: AC | PRN
  Administered 2021-06-29: 40 mg via INTRA_ARTICULAR

## 2021-06-29 MED ORDER — LIDOCAINE HCL 1 % IJ SOLN
3.0000 mL | INTRAMUSCULAR | Status: AC | PRN
  Administered 2021-06-29: 3 mL

## 2021-06-29 MED ORDER — LIDOCAINE HCL 1 % IJ SOLN
3.0000 mL | INTRAMUSCULAR | Status: AC | PRN
Start: 2021-06-29 — End: 2021-06-29
  Administered 2021-06-29: 3 mL

## 2021-06-29 NOTE — Progress Notes (Signed)
? ?Office Visit Note ?  ?Patient: [name]           ?Date of Birth: February 15, 1927           ?MRN: [id number] ?Visit Date: 06/29/2021 ?             ?Requested by: Terrilyn Saver, NP ?[address] ?[address] ?[place],  [place] [address] ?PCP: Terrilyn Saver, NP ? ? ?Assessment & Plan: ?Visit Diagnoses:  ?1. Chronic pain of left knee   ?2. Chronic pain of right knee   ? ? ?Plan: Per the patient's request I did provide a steroid injection in both knees that she tolerated well.  She knows to wait at least 3 months between injections.  All questions and concerns were answered and addressed. ? ?Follow-Up Instructions: Return if symptoms worsen or fail to improve.  ? ?Orders:  ?Orders Placed This Encounter  ?Procedures  ? Large Joint Inj  ? Large Joint Inj  ? ?No orders of the defined types were placed in this encounter. ? ? ? ? Procedures: ?Large Joint Inj: R knee on 06/29/2021 1:52 PM ?Indications: diagnostic evaluation and pain ?Details: 22 G 1.5 in needle, superolateral approach ? ?Arthrogram: No ? ?Medications: 3 mL lidocaine 1 %; 40 mg methylPREDNISolone acetate 40 MG/ML ?Outcome: tolerated well, no immediate complications ?Procedure, treatment alternatives, risks and benefits explained, specific risks discussed. Consent was given by the patient. Immediately prior to procedure a time out was called to verify the correct patient, procedure, equipment, support staff and site/side marked as required. Patient was prepped and draped in the usual sterile fashion.  ? ? ?Large Joint Inj: L knee on 06/29/2021 1:52 PM ?Indications: diagnostic evaluation and pain ?Details: 22 G 1.5 in needle, superolateral approach ? ?Arthrogram: No ? ?Medications: 3 mL lidocaine 1 %; 40 mg methylPREDNISolone acetate 40 MG/ML ?Outcome: tolerated well, no immediate complications ?Procedure, treatment alternatives, risks and benefits explained, specific risks discussed. Consent was given by the patient. Immediately prior to procedure a time  out was called to verify the correct patient, procedure, equipment, support staff and site/side marked as required. Patient was prepped and draped in the usual sterile fashion.  ? ? ? ? ?Clinical Data: ?No additional findings. ? ? ?Subjective: ?Chief Complaint  ?Patient presents with  ? Right Knee - Pain  ? Left Knee - Pain  ?The patient is well-known to [place].  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  She lives alone.  She comes in about every 3 months or so for steroid injections in both knees.  She is requesting those again today.  Its been over 3 months since her last injections.  She says they do help for a while.  She is not a diabetic.  She denies any acute change in her medical status.  She does not walk with assistive device.  She is still not interested in knee replacement surgery and said that she has no one that can help her out.  Again she is requesting steroid injections in both knees today. ? ?HPI ? ?Review of Systems ?There is no listed fever, chills, nausea, vomiting ? ?Objective: ?Vital Signs: LMP  (LMP Unknown)  ? ?Physical Exam ? ?Ortho Exam ?Both knees have global tenderness throughout the arc of motion.  Both knees are ligamentously stable but have patellofemoral crepitation. ?Specialty Comments:  ?No specialty comments available. ? ?Imaging: ?No results found. ? ? ?PMFS History: ?Patient Active Problem  List  ? Diagnosis Date Noted  ? Aortic valve sclerosis 02/24/2021  ? Varicose veins of both lower extremities 02/24/2021  ? Left knee pain 04/28/2015  ? Atherosclerosis of native coronary artery of native heart without angina pectoris 11/22/2013  ? Essential hypertension 11/22/2013  ? Pure hypercholesterolemia 11/22/2013  ? Inversion, nipple 09/05/2013  ? Shingles   ? Elevated cholesterol   ? Cancer [facility])   ? Right knee pain 05/10/2011  ? ?Past Medical History:  ?Diagnosis Date  ? Aortic valve sclerosis   ? CAD (coronary artery disease)   ? S/p DES to the proximal RCA, POBA  to the distal RCA 8/08 //Myoview 2009 low risk // Myoview 9/22: EF 57, no ischemia or infarction, apical thinning artifact, low risk study  ? DUB (dysfunctional uterine bleeding)   ? Elevated cholesterol   ? Atorvastatin  ? GERD (gastroesophageal reflux disease)   ? History of echocardiogram   ? EF 60-65, GRII DD, GLS -18.4, normal RVSF, moderate MAC, moderate AV calcification, trivial AI  ? Hypertension   ? Plantar fasciitis   ? Shingles   ?  ?Family History  ?Problem Relation Age of Onset  ? Heart attack Mother   ? Hypertension Mother   ? Heart attack Father   ? CAD Father   ? Heart disease Brother   ? Hypertension Brother   ? Diabetes Brother   ? COPD Brother   ? Asthma Brother   ? Breast cancer Neg Hx   ?  ?Past Surgical History:  ?Procedure Laterality Date  ? APPENDECTOMY  2011  ? CARDIAC STINT IMPLANT  2008  ? CARPAL TUNNEL RELEASE    ? EYE SURGERY    ? INGUINAL HERNIA REPAIR    ? OOPHORECTOMY  2001  ? LAVH,BSO  ? ULNA NERVE RELEASE  2009  ? VAGINAL HYSTERECTOMY  2001  ? LAVH, BSO  ? ?Social History  ? ?Occupational History  ? Not on file  ?Tobacco Use  ? Smoking status: Former  ? Smokeless tobacco: Never  ?Vaping Use  ? Vaping Use: Never used  ?Substance and Sexual Activity  ? Alcohol use: No  ?  Alcohol/week: 0.0 standard drinks  ? Drug use: No  ? Sexual activity: Never  ?  Birth control/protection: Post-menopausal, Surgical  ?  Comment: INTERCOURSE AGE 28, SEXUAL PARTNERS LEWSS THAN 7  ? ? ? ? ? ? ?

## 2021-07-06 ENCOUNTER — Encounter: Payer: Self-pay | Admitting: Family Medicine

## 2021-07-06 ENCOUNTER — Ambulatory Visit (INDEPENDENT_AMBULATORY_CARE_PROVIDER_SITE_OTHER): Payer: Medicare HMO | Admitting: Family Medicine

## 2021-07-06 VITALS — BP 118/70 | HR 62 | Temp 98.7°F | Resp 18 | Ht 64.0 in | Wt 155.6 lb

## 2021-07-06 DIAGNOSIS — J40 Bronchitis, not specified as acute or chronic: Secondary | ICD-10-CM | POA: Diagnosis not present

## 2021-07-06 MED ORDER — AZITHROMYCIN 250 MG PO TABS: ORAL_TABLET | ORAL | 0 refills | Status: DC

## 2021-07-06 MED ORDER — FLUTICASONE PROPIONATE 50 MCG/ACT NA SUSP: 2.0000 | Freq: Every day | NASAL | 6 refills | Status: DC

## 2021-07-06 NOTE — Progress Notes (Signed)
? ?Subjective:  ? ?By signing my name below, I, Carylon Perches, attest that this documentation has been prepared under the direction and in the presence of Roma Schanz DO, 07/06/2021 ? ? Patient ID: [name], female    DOB: 04-Jul-1926, 88 y.o.   MRN: [id number] ? ?Chief Complaint  ?Patient presents with  ? Cough  ?  Sxs started Wed last week, and states having runny nose. No COVID test. No fever  ? ? ?Cough ?Pertinent negatives include no fever or wheezing.  ?Patient is in today for an office visit. ? ?She complains of possible bronchitis that begun on 07/01/2021. Her symptoms include a slight wet cough, rhinorrhea , congestion, and sinus pain. Her cough has improved on 07/06/2021. She is still taking 10 MG of Zyrtec. She is not taking additional OTC medication. She denies of any fever or wheezing.  ? ?Past Medical History:  ?Diagnosis Date  ? Aortic valve sclerosis   ? CAD (coronary artery disease)   ? S/p DES to the proximal RCA, POBA to the distal RCA 8/08 //Myoview 2009 low risk // Myoview 9/22: EF 57, no ischemia or infarction, apical thinning artifact, low risk study  ? DUB (dysfunctional uterine bleeding)   ? Elevated cholesterol   ? Atorvastatin  ? GERD (gastroesophageal reflux disease)   ? History of echocardiogram   ? EF 60-65, GRII DD, GLS -18.4, normal RVSF, moderate MAC, moderate AV calcification, trivial AI  ? Hypertension   ? Plantar fasciitis   ? Shingles   ? ? ?Past Surgical History:  ?Procedure Laterality Date  ? APPENDECTOMY  2011  ? CARDIAC STINT IMPLANT  2008  ? CARPAL TUNNEL RELEASE    ? EYE SURGERY    ? INGUINAL HERNIA REPAIR    ? OOPHORECTOMY  2001  ? LAVH,BSO  ? ULNA NERVE RELEASE  2009  ? VAGINAL HYSTERECTOMY  2001  ? LAVH, BSO  ? ? ?Family History  ?Problem Relation Age of Onset  ? Heart attack Mother   ? Hypertension Mother   ? Heart attack Father   ? CAD Father   ? Heart disease Brother   ? Hypertension Brother   ? Diabetes Brother   ? COPD Brother   ? Asthma Brother   ?  Breast cancer Neg Hx   ? ? ?Social History  ? ?Socioeconomic History  ? Marital status: Widowed  ?  Spouse name: Not on file  ? Number of children: Not on file  ? Years of education: Not on file  ? Highest education level: Not on file  ?Occupational History  ? Not on file  ?Tobacco Use  ? Smoking status: Former  ? Smokeless tobacco: Never  ?Vaping Use  ? Vaping Use: Never used  ?Substance and Sexual Activity  ? Alcohol use: No  ?  Alcohol/week: 0.0 standard drinks  ? Drug use: No  ? Sexual activity: Never  ?  Birth control/protection: Post-menopausal, Surgical  ?  Comment: INTERCOURSE AGE 71, SEXUAL PARTNERS LEWSS THAN 5  ?Other Topics Concern  ? Not on file  ?Social History Narrative  ? Not on file  ? ?Social Determinants of Health  ? ?Financial Resource Strain: Not on file  ?Food Insecurity: Not on file  ?Transportation Needs: Not on file  ?Physical Activity: Not on file  ?Stress: Not on file  ?Social Connections: Not on file  ?Intimate Partner Violence: Not on file  ? ? ?Outpatient Medications Prior to Visit  ?Medication Sig Dispense Refill  ?  Ascorbic Acid (VITAMIN C) 1000 MG tablet Take 1,000 mg by mouth daily.    ? aspirin 81 MG tablet Take 81 mg by mouth daily.      ? atorvastatin (LIPITOR) 40 MG tablet Take 40 mg by mouth daily.     ? cetirizine (ZYRTEC) 10 MG tablet Take 10 mg by mouth daily.      ? Cholecalciferol (VITAMIN D) 2000 units CAPS Take 2,000 Units by mouth daily.    ? clorazepate (TRANXENE) 7.5 MG tablet Take 7.5 mg by mouth once as needed. For nerves    ? Coenzyme Q10 (CO Q 10 PO) Take 1 tablet by mouth every morning.    ? diclofenac Sodium (VOLTAREN) 1 % GEL Apply 4 g topically 4 (four) times daily. 350 g 2  ? lisinopril (PRINIVIL,ZESTRIL) 40 MG tablet Take 40 mg by mouth daily.    ? pantoprazole (PROTONIX) 40 MG tablet Take 40 mg by mouth daily.     ? amLODipine (NORVASC) 5 MG tablet Take 1 tablet (5 mg total) by mouth daily. 90 tablet 3  ? nitroGLYCERIN (NITROSTAT) 0.4 MG SL tablet Place 1  tablet (0.4 mg total) under the tongue every 5 (five) minutes as needed for chest pain. 25 tablet 3  ? ?No facility-administered medications prior to visit.  ? ? ?Allergies  ?Allergen Reactions  ? Sulfa Antibiotics Nausea And Vomiting  ? ? ?Review of Systems  ?Constitutional:  Negative for fever.  ?HENT:  Positive for congestion and sinus pain.   ?     (+) Rhinorrhea  ?Respiratory:  Positive for cough (Wet Cough). Negative for wheezing.   ? ?   ?Objective:  ?  ?Physical Exam ?Constitutional:   ?   General: She is not in acute distress. ?   Appearance: Normal appearance. She is not ill-appearing.  ?HENT:  ?   Head: Normocephalic and atraumatic.  ?   Right Ear: External ear normal.  ?   Left Ear: External ear normal.  ?Eyes:  ?   Extraocular Movements: Extraocular movements intact.  ?   Pupils: Pupils are equal, round, and reactive to light.  ?Cardiovascular:  ?   Rate and Rhythm: Normal rate and regular rhythm.  ?   Heart sounds: Normal heart sounds. No murmur heard. ?  No gallop.  ?Pulmonary:  ?   Effort: Pulmonary effort is normal. No respiratory distress.  ?   Breath sounds: Normal breath sounds. No wheezing or rales.  ?Skin: ?   General: Skin is warm and dry.  ?Neurological:  ?   Mental Status: She is alert and oriented to person, place, and time.  ?Psychiatric:     ?   Judgment: Judgment normal.  ? ? ?BP 118/70 (BP Location: Right Arm, Patient Position: Sitting, Cuff Size: Normal)   Pulse 62   Temp 98.7 ?F (37.1 ?C) (Oral)   Resp 18   Ht _0  (1.626 m)   Wt 155 lb 9.6 oz (70.6 kg)   LMP  (LMP Unknown)   SpO2 98%   BMI 26.71 kg/m?  ?Wt Readings from Last 3 Encounters:  ?07/06/21 155 lb 9.6 oz (70.6 kg)  ?05/14/21 153 lb (69.4 kg)  ?03/25/21 161 lb (73 kg)  ? ? ?Diabetic Foot Exam - Simple   ?No data filed ?  ? ?Lab Results  ?Component Value Date  ? WBC 5.8 05/07/2021  ? HGB 12.3 05/07/2021  ? HCT 36 05/07/2021  ? PLT 208 05/07/2021  ? GLUCOSE 78 06/10/2021  ?  CHOL 158 05/07/2021  ? TRIG 38 (A) 05/07/2021   ? HDL 89 (A) 05/07/2021  ? [place] 60 05/07/2021  ? ALT 24 06/02/2021  ? AST 24 06/02/2021  ? NA 135 06/10/2021  ? K 4.3 06/10/2021  ? CL 99 06/10/2021  ? CREATININE 0.84 06/10/2021  ? BUN 19 06/10/2021  ? CO2 27 06/10/2021  ? TSH 3.33 04/14/2021  ? INR 1.0 11/11/2006  ? ? ?Lab Results  ?Component Value Date  ? TSH 3.33 04/14/2021  ? ?Lab Results  ?Component Value Date  ? WBC 5.8 05/07/2021  ? HGB 12.3 05/07/2021  ? HCT 36 05/07/2021  ? MCV 88 12/02/2020  ? PLT 208 05/07/2021  ? ?Lab Results  ?Component Value Date  ? NA 135 06/10/2021  ? K 4.3 06/10/2021  ? CO2 27 06/10/2021  ? GLUCOSE 78 06/10/2021  ? BUN 19 06/10/2021  ? CREATININE 0.84 06/10/2021  ? BILITOT 0.6 06/02/2021  ? ALKPHOS 79 06/02/2021  ? AST 24 06/02/2021  ? ALT 24 06/02/2021  ? PROT 6.1 06/02/2021  ? ALBUMIN 4.1 06/02/2021  ? CALCIUM 9.5 06/10/2021  ? EGFR 62 12/10/2020  ? GFR 60.24 06/10/2021  ? ?Lab Results  ?Component Value Date  ? CHOL 158 05/07/2021  ? ?Lab Results  ?Component Value Date  ? HDL 89 (A) 05/07/2021  ? ?Lab Results  ?Component Value Date  ? [place] 60 05/07/2021  ? ?Lab Results  ?Component Value Date  ? TRIG 38 (A) 05/07/2021  ? ?No results found for: CHOLHDL ?No results found for: HGBA1C ? ?   ?Assessment & Plan:  ? ?Problem List Items Addressed This Visit   ?None ?Visit Diagnoses   ? ? Bronchitis    -  Primary  ? Relevant Medications  ? azithromycin (ZITHROMAX Z-PAK) 250 MG tablet  ? fluticasone (FLONASE) 50 MCG/ACT nasal spray  ? ?  ?She did not want rx cough med ?She can use delsym or robitussin dm  ?F/u prn  ? ? ?Meds ordered this encounter  ?Medications  ? azithromycin (ZITHROMAX Z-PAK) 250 MG tablet  ?  Sig: As directed  ?  Dispense:  6 each  ?  Refill:  0  ? fluticasone (FLONASE) 50 MCG/ACT nasal spray  ?  Sig: Place 2 sprays into both nostrils daily.  ?  Dispense:  16 g  ?  Refill:  6  ? ? ?I, Ann Held, DO, personally preformed the services described in this documentation.  All medical record entries made by  the scribe were at my direction and in my presence.  I have reviewed the chart and discharge instructions (if applicable) and agree that the record reflects my personal performance and is accurate and complete. 04

## 2021-07-06 NOTE — Patient Instructions (Signed)
Acute Bronchitis, Adult ? ?Acute bronchitis is sudden inflammation of the main airways (bronchi) that come off the windpipe (trachea) in the lungs. The swelling causes the airways to get smaller and make more mucus than normal. This can make it hard to breathe and can cause coughing or noisy breathing (wheezing). ?Acute bronchitis may last several weeks. The cough may last longer. Allergies, asthma, and exposure to smoke may make the condition worse. ?What are the causes? ?This condition can be caused by germs and by substances that irritate the lungs, including: ?Cold and flu viruses. The most common cause of this condition is the virus that causes the common cold. ?Bacteria. This is less common. ?Breathing in substances that irritate the lungs, including: ?Smoke from cigarettes and other forms of tobacco. ?Dust and pollen. ?Fumes from household cleaning products, gases, or burned fuel. ?Indoor or outdoor air pollution. ?What increases the risk? ?The following factors may make you more likely to develop this condition: ?A weak body's defense system, also called the immune system. ?A condition that affects your lungs and breathing, such as asthma. ?What are the signs or symptoms? ?Common symptoms of this condition include: ?Coughing. This may bring up clear, yellow, or green mucus from your lungs (sputum). ?Wheezing. ?Runny or stuffy nose. ?Having too much mucus in your lungs (chest congestion). ?Shortness of breath. ?Aches and pains, including sore throat or chest. ?How is this diagnosed? ?This condition is usually diagnosed based on: ?Your symptoms and medical history. ?A physical exam. ?You may also have other tests, including tests to rule out other conditions, such as pneumonia. These tests include: ?A test of lung function. ?Test of a mucus sample to look for the presence of bacteria. ?Tests to check the oxygen level in your blood. ?Blood tests. ?Chest X-ray. ?How is this treated? ?Most cases of acute  bronchitis clear up over time without treatment. Your health care provider may recommend: ?Drinking more fluids to help thin your mucus so it is easier to cough up. ?Taking inhaled medicine (inhaler) to improve air flow in and out of your lungs. ?Using a vaporizer or a humidifier. These are machines that add water to the air to help you breathe better. ?Taking a medicine that thins mucus and clears congestion (expectorant). ?Taking a medicine that prevents or stops coughing (cough suppressant). ?It is notcommon to take an antibiotic medicine for this condition. ?Follow these instructions at home: ? ?Take over-the-counter and prescription medicines only as told by your health care provider. ?Use an inhaler, vaporizer, or humidifier as told by your health care provider. ?Take two teaspoons (10 mL) of honey at bedtime to lessen coughing at night. ?Drink enough fluid to keep your urine pale yellow. ?Do not use any products that contain nicotine or tobacco. These products include cigarettes, chewing tobacco, and vaping devices, such as e-cigarettes. If you need help quitting, ask your health care provider. ?Get plenty of rest. ?Return to your normal activities as told by your health care provider. Ask your health care provider what activities are safe for you. ?Keep all follow-up visits. This is important. ?How is this prevented? ?To lower your risk of getting this condition again: ?Wash your hands often with soap and water for at least 20 seconds. If soap and water are not available, use hand sanitizer. ?Avoid contact with people who have cold symptoms. ?Try not to touch your mouth, nose, or eyes with your hands. ?Avoid breathing in smoke or chemical fumes. Breathing smoke or chemical fumes will make your   condition worse. ?Get the flu shot every year. ?Contact a health care provider if: ?Your symptoms do not improve after 2 weeks. ?You have trouble coughing up the mucus. ?Your cough keeps you awake at night. ?You have a  fever. ?Get help right away if you: ?Cough up blood. ?Feel pain in your chest. ?Have severe shortness of breath. ?Faint or keep feeling like you are going to faint. ?Have a severe headache. ?Have a fever or chills that get worse. ?These symptoms may represent a serious problem that is an emergency. Do not wait to see if the symptoms will go away. Get medical help right away. Call your local emergency services (911 in the [place] Do not drive yourself to the hospital. ?Summary ?Acute bronchitis is inflammation of the main airways (bronchi) that come off the windpipe (trachea) in the lungs. The swelling causes the airways to get smaller and make more mucus than normal. ?Drinking more fluids can help thin your mucus so it is easier to cough up. ?Take over-the-counter and prescription medicines only as told by your health care provider. ?Do not use any products that contain nicotine or tobacco. These products include cigarettes, chewing tobacco, and vaping devices, such as e-cigarettes. If you need help quitting, ask your health care provider. ?Contact a health care provider if your symptoms do not improve after 2 weeks. ?This information is not intended to replace advice given to you by your health care provider. Make sure you discuss any questions you have with your health care provider. ?Document Revised: 07/02/2020 Document Reviewed: 07/02/2020 ?Elsevier Patient Education ? 2023 Elsevier Inc. ? ?

## 2021-09-02 ENCOUNTER — Telehealth: Payer: Self-pay | Admitting: *Deleted

## 2021-09-14 NOTE — Telephone Encounter (Signed)
Spoke to patient. She wants to see [name] before maternity leave and will keep follow up with her on 7/24.  She declines wellness exam at this time as she had one with the nurse from her insurance company in January.

## 2021-09-22 ENCOUNTER — Ambulatory Visit (INDEPENDENT_AMBULATORY_CARE_PROVIDER_SITE_OTHER): Payer: Medicare HMO | Admitting: Physician Assistant

## 2021-09-22 ENCOUNTER — Encounter: Payer: Self-pay | Admitting: Physician Assistant

## 2021-09-22 VITALS — BP 122/52 | HR 68 | Ht 64.0 in | Wt 155.8 lb

## 2021-09-22 DIAGNOSIS — I83893 Varicose veins of bilateral lower extremities with other complications: Secondary | ICD-10-CM | POA: Diagnosis not present

## 2021-09-22 DIAGNOSIS — E7800 Pure hypercholesterolemia, unspecified: Secondary | ICD-10-CM | POA: Diagnosis not present

## 2021-09-22 DIAGNOSIS — I2510 Atherosclerotic heart disease of native coronary artery without angina pectoris: Secondary | ICD-10-CM

## 2021-09-22 DIAGNOSIS — I10 Essential (primary) hypertension: Secondary | ICD-10-CM | POA: Diagnosis not present

## 2021-09-22 NOTE — Assessment & Plan Note (Signed)
The patient's blood pressure is controlled on her current regimen.  Continue current therapy.   

## 2021-09-22 NOTE — Assessment & Plan Note (Signed)
She has varicose veins. She notes dependent edema. She has venous insufficiency and I have recommended compression stockings, elevation.

## 2021-09-22 NOTE — Assessment & Plan Note (Signed)
LDL optimal on most recent lab work.  Continue current Rx.   

## 2021-09-22 NOTE — Assessment & Plan Note (Signed)
Hx of DES to [facility] and angioplasty to dRCA in 2008. Myoview in 9/22 was low risk. She is not having anginal symptoms.  Continue current management which includes ASA, Atorvastatin. F/u in 6 mos.

## 2021-09-22 NOTE — Progress Notes (Signed)
Cardiology Office Note:    Date:  09/22/2021   ID:  [name], DOB 23-Sep-1926, MRN [id number]  PCP:  Terrilyn Saver, NP  [place] Providers Cardiologist:  Candee Furbish, MD    Referring MD: Terrilyn Saver, NP   Chief Complaint:  F/u for CAD    Patient Profile: Coronary artery disease  S/p DES to pRCA and POBA (cutting balloon) to dRCA in 10/2006 Myoview in 2009 low risk Rare angina (throat tightness) Myoview 11/2020: low risk, EF 57 Hypertension  Hyperlipidemia  GERD   Prior CV Studies: GATED SPECT MYO PERF W/LEXISCAN STRESS 1D 12/10/2020 Reduced counts in the apex with normal wall motion consistent with apical thinning artifact.  No evidence of ischemia or infarction.  Normal LVEF, 57%. This is a low risk study.    Echocardiogram 05/30/2018 EF 60-65, GRII DD, GLS -18.4, normal RVSF, moderate MAC, moderate AV calcification, trivial AI   Cardiac catheterization 11/11/2006 RCA proximal 50, 99, distal 70-80 PCI: DES to the proximal RCA, POBA to the distal RCA  History of Present Illness:   [name] is a 88 y.o. female with the above problem list.  She was last seen by Dr. Marlou Porch in December 2022.  She returns for follow-up. She is here alone. She is doing well without chest pain, exertional shortness of breath, orthopnea, syncope.  She often feels like she has to take a deep breath.  She notes leg edema that occurs with standing and improves with elevation.         Past Medical History:  Diagnosis Date   Aortic valve sclerosis    CAD (coronary artery disease)    S/p DES to the proximal RCA, POBA to the distal RCA 8/08 //Myoview 2009 low risk // Myoview 9/22: EF 57, no ischemia or infarction, apical thinning artifact, low risk study   DUB (dysfunctional uterine bleeding)    Elevated cholesterol    Atorvastatin   GERD (gastroesophageal reflux disease)    History of echocardiogram    EF 60-65, GRII DD, GLS -18.4, normal RVSF, moderate MAC, moderate AV  calcification, trivial AI   Hypertension    Plantar fasciitis    Shingles    Current Medications: Current Meds  Medication Sig   amLODipine (NORVASC) 5 MG tablet Take 1 tablet (5 mg total) by mouth daily.   Ascorbic Acid (VITAMIN C) 1000 MG tablet Take 1,000 mg by mouth daily.   aspirin 81 MG tablet Take 81 mg by mouth daily.     atorvastatin (LIPITOR) 40 MG tablet Take 40 mg by mouth daily.    cetirizine (ZYRTEC) 10 MG tablet Take 10 mg by mouth daily.     Cholecalciferol (VITAMIN D) 2000 units CAPS Take 2,000 Units by mouth daily.   clorazepate (TRANXENE) 7.5 MG tablet Take 7.5 mg by mouth once as needed. For nerves   Coenzyme Q10 (CO Q 10 PO) Take 1 tablet by mouth every morning.   diclofenac Sodium (VOLTAREN) 1 % GEL Apply 4 g topically 4 (four) times daily.   fluticasone (FLONASE) 50 MCG/ACT nasal spray Place 2 sprays into both nostrils daily.   lisinopril (PRINIVIL,ZESTRIL) 40 MG tablet Take 40 mg by mouth daily.   nitroGLYCERIN (NITROSTAT) 0.4 MG SL tablet Place 1 tablet (0.4 mg total) under the tongue every 5 (five) minutes as needed for chest pain.   pantoprazole (PROTONIX) 40 MG tablet Take 40 mg by mouth daily.     Allergies:   Sulfa antibiotics  Social History   Tobacco Use   Smoking status: Former   Smokeless tobacco: Never  Scientific laboratory technician Use: Never used  Substance Use Topics   Alcohol use: No    Alcohol/week: 0.0 standard drinks of alcohol   Drug use: No    Family Hx: The patient's family history includes Asthma in her brother; CAD in her father; COPD in her brother; Diabetes in her brother; Heart attack in her father and mother; Heart disease in her brother; Hypertension in her brother and mother. There is no history of Breast cancer.  Review of Systems  Constitutional: Negative for fever.  Respiratory:  Negative for cough.   Gastrointestinal:  Negative for hematochezia.  Genitourinary:  Negative for hematuria.     EKGs/Labs/Other Test Reviewed:     EKG:  EKG is not ordered today.  The ekg ordered today demonstrates n/a  Recent Labs: 12/02/2020: NT-Pro BNP 112 04/14/2021: TSH 3.33 05/07/2021: Hemoglobin 12.3; Platelets 208 06/02/2021: ALT 24 06/10/2021: BUN 19; Creatinine, Ser 0.84; Potassium 4.3; Sodium 135   Recent Lipid Panel Recent Labs    05/07/21 0000  CHOL 158  TRIG 38*  HDL 89*  LDLCALC 60     Risk Assessment/Calculations/Metrics:              Physical Exam:    VS:  BP (!) 122/52   Pulse 68   Ht _0  (1.626 m)   Wt 155 lb 12.8 oz (70.7 kg)   LMP  (LMP Unknown)   SpO2 97%   BMI 26.74 kg/m     Wt Readings from Last 3 Encounters:  09/22/21 155 lb 12.8 oz (70.7 kg)  07/06/21 155 lb 9.6 oz (70.6 kg)  05/14/21 153 lb (69.4 kg)    Constitutional:      Appearance: Healthy appearance. Not in distress.  Neck:     Vascular: No JVR. JVD normal.  Pulmonary:     Effort: Pulmonary effort is normal.     Breath sounds: No wheezing. No rales.  Cardiovascular:     Normal rate. Regular rhythm. Normal S1. Normal S2.      Murmurs: There is a grade 1/6 systolic murmur at the URSB.  Edema:    Peripheral edema present.    Ankle: bilateral trace edema of the ankle. Abdominal:     Palpations: Abdomen is soft.  Skin:    General: Skin is warm and dry.  Neurological:     Mental Status: Alert and oriented to person, place and time.         ASSESSMENT & PLAN:   Coronary artery disease involving native coronary artery of native heart without angina pectoris Hx of DES to [facility] and angioplasty to dRCA in 2008. Myoview in 9/22 was low risk. She is not having anginal symptoms.  Continue current management which includes ASA, Atorvastatin. F/u in 6 mos.   Essential hypertension The patient's blood pressure is controlled on her current regimen.  Continue current therapy.    Pure hypercholesterolemia LDL optimal on most recent lab work.  Continue current Rx.    Varicose veins of both lower extremities She has varicose  veins. She notes dependent edema. She has venous insufficiency and I have recommended compression stockings, elevation.            Dispo:  Return in about 6 months (around 03/25/2022) for Routine Follow Up with Dr. Marlou Porch.   Medication Adjustments/Labs and Tests Ordered: Current medicines are reviewed at length with the patient today.  Concerns regarding medicines are outlined above.  Tests Ordered: No orders of the defined types were placed in this encounter.  Medication Changes: No orders of the defined types were placed in this encounter.  Signed, Richardson Dopp, PA-C  09/22/2021 2:39 PM    [address], [place], [place]  [address] Phone: [phone]; Fax: [phone]

## 2021-09-22 NOTE — Patient Instructions (Signed)
Medication Instructions:  Your physician recommends that you continue on your current medications as directed. Please refer to the Current Medication list given to you today.  *If you need a refill on your cardiac medications before your next appointment, please call your pharmacy*   Lab Work: None ordered  If you have labs (blood work) drawn today and your tests are completely normal, you will receive your results only by: [place] (if you have MyChart) OR A paper copy in the mail If you have any lab test that is abnormal or we need to change your treatment, we will call you to review the results.   Testing/Procedures: None ordered   Follow-Up: At [facility], you and your health needs are our priority.  As part of our continuing mission to provide you with exceptional heart care, we have created designated Provider Care Teams.  These Care Teams include your primary Cardiologist (physician) and Advanced Practice Providers (APPs -  Physician Assistants and Nurse Practitioners) who all work together to provide you with the care you need, when you need it.  We recommend signing up for the patient portal called "MyChart".  Sign up information is provided on this After Visit Summary.  MyChart is used to connect with patients for Virtual Visits (Telemedicine).  Patients are able to view lab/test results, encounter notes, upcoming appointments, etc.  Non-urgent messages can be sent to your provider as well.   To learn more about what you can do with MyChart, go to [email or web address].    Your next appointment:   6 month(s)  The format for your next appointment:   In Person  Provider:   Candee Furbish, MD     Other Instructions   Important Information About Sugar

## 2021-09-24 DIAGNOSIS — I358 Other nonrheumatic aortic valve disorders: Secondary | ICD-10-CM

## 2021-09-24 DIAGNOSIS — R0602 Shortness of breath: Secondary | ICD-10-CM

## 2021-09-24 DIAGNOSIS — I2510 Atherosclerotic heart disease of native coronary artery without angina pectoris: Secondary | ICD-10-CM

## 2021-09-24 NOTE — Telephone Encounter (Signed)
Please arrange BMET, NT-Pro BNP and an echocardiogram. Dx: shortness of breath  Richardson Dopp, PA-C    09/24/2021 3:06 PM

## 2021-09-25 ENCOUNTER — Telehealth: Payer: Self-pay | Admitting: *Deleted

## 2021-09-25 ENCOUNTER — Telehealth: Payer: Self-pay

## 2021-09-25 ENCOUNTER — Other Ambulatory Visit: Payer: Medicare HMO

## 2021-09-25 DIAGNOSIS — F419 Anxiety disorder, unspecified: Secondary | ICD-10-CM

## 2021-09-25 DIAGNOSIS — I358 Other nonrheumatic aortic valve disorders: Secondary | ICD-10-CM

## 2021-09-25 DIAGNOSIS — R0602 Shortness of breath: Secondary | ICD-10-CM

## 2021-09-25 MED ORDER — CLORAZEPATE DIPOTASSIUM 7.5 MG PO TABS: 7.5000 mg | ORAL_TABLET | Freq: Once | ORAL | 0 refills | Status: DC | PRN

## 2021-09-25 NOTE — Telephone Encounter (Signed)
Pt aware.

## 2021-09-25 NOTE — Telephone Encounter (Signed)
PA approved with silver script from 03/15/21 - 8/13/203.

## 2021-09-25 NOTE — Telephone Encounter (Signed)
Pt would like you to refill clorazepate (TRANXENE) 7.5 MG tablet but we haven't filled it for her before.

## 2021-09-26 LAB — BASIC METABOLIC PANEL
BUN/Creatinine Ratio: 19 (ref 12–28)
BUN: 17 mg/dL (ref 10–36)
CO2: 23 mmol/L (ref 20–29)
Calcium: 9.4 mg/dL (ref 8.7–10.3)
Chloride: 99 mmol/L (ref 96–106)
Creatinine, Ser: 0.89 mg/dL (ref 0.57–1.00)
Glucose: 82 mg/dL (ref 70–99)
Potassium: 4.7 mmol/L (ref 3.5–5.2)
Sodium: 134 mmol/L (ref 134–144)
eGFR: 61 mL/min/{1.73_m2} (ref >59)

## 2021-09-26 LAB — PRO B NATRIURETIC PEPTIDE: NT-Pro BNP: 101 pg/mL (ref 0–738)

## 2021-10-05 ENCOUNTER — Ambulatory Visit: Payer: Medicare HMO | Admitting: Family Medicine

## 2021-10-07 ENCOUNTER — Telehealth: Payer: Self-pay

## 2021-10-07 ENCOUNTER — Ambulatory Visit (INDEPENDENT_AMBULATORY_CARE_PROVIDER_SITE_OTHER): Payer: Medicare HMO | Admitting: Orthopaedic Surgery

## 2021-10-07 DIAGNOSIS — G8929 Other chronic pain: Secondary | ICD-10-CM

## 2021-10-07 DIAGNOSIS — M25561 Pain in right knee: Secondary | ICD-10-CM

## 2021-10-07 DIAGNOSIS — M25562 Pain in left knee: Secondary | ICD-10-CM

## 2021-10-07 MED ORDER — LIDOCAINE HCL 1 % IJ SOLN
3.0000 mL | INTRAMUSCULAR | Status: AC | PRN
  Administered 2021-10-07: 3 mL

## 2021-10-07 MED ORDER — METHYLPREDNISOLONE ACETATE 40 MG/ML IJ SUSP
40.0000 mg | INTRAMUSCULAR | Status: AC | PRN
  Administered 2021-10-07: 40 mg via INTRA_ARTICULAR

## 2021-10-07 NOTE — Progress Notes (Signed)
Office Visit Note   Patient: [name]           Date of Birth: 02-03-1927           MRN: [id number] Visit Date: 10/07/2021              Requested by: Terrilyn Saver, NP [address] [place],  [place] [address] PCP: Terrilyn Saver, NP   Assessment & Plan: Visit Diagnoses:  1. Chronic pain of left knee   2. Chronic pain of right knee     Plan: I was able to place a steroid injection successfully in both knees today.  We will see if we can order hyaluronic acid to treat the pain from osteoarthritis of both knees.  She agrees with this treatment plan.  Follow-Up Instructions: No follow-ups on file.   Orders:  Orders Placed This Encounter  Procedures   Large Joint Inj   Large Joint Inj   No orders of the defined types were placed in this encounter.     Procedures: Large Joint Inj: R knee on 10/07/2021 1:25 PM Indications: diagnostic evaluation and pain Details: 22 G 1.5 in needle, superolateral approach  Arthrogram: No  Medications: 3 mL lidocaine 1 %; 40 mg methylPREDNISolone acetate 40 MG/ML Outcome: tolerated well, no immediate complications Procedure, treatment alternatives, risks and benefits explained, specific risks discussed. Consent was given by the patient. Immediately prior to procedure a time out was called to verify the correct patient, procedure, equipment, support staff and site/side marked as required. Patient was prepped and draped in the usual sterile fashion.    Large Joint Inj: L knee on 10/07/2021 1:25 PM Indications: diagnostic evaluation and pain Details: 22 G 1.5 in needle, superolateral approach  Arthrogram: No  Medications: 3 mL lidocaine 1 %; 40 mg methylPREDNISolone acetate 40 MG/ML Outcome: tolerated well, no immediate complications Procedure, treatment alternatives, risks and benefits explained, specific risks discussed. Consent was given by the patient. Immediately prior to procedure a time out was called to verify  the correct patient, procedure, equipment, support staff and site/side marked as required. Patient was prepped and draped in the usual sterile fashion.       Clinical Data: No additional findings.   Subjective: Chief Complaint  Patient presents with   Left Knee - Pain   Right Knee - Pain  The patient is well-known to me.  She is a 88 year old female who is still quite active.  She has known moderate arthritis in both knees.  She comes in today requesting steroid injections in both knees.  We have done this in the past and has been over 3 months.  In the past she is also had hyaluronic acid but is having a very long period of time.  She says she would like to try that regimen again.  She has had no acute change in her medical status other than at times she feels "winded".  I believe she is scheduled for an upcoming echocardiogram.  HPI  Review of Systems Today she denies any chest pain or shortness of breath.  She denies any fever, chills, nausea, vomiting  Objective: Vital Signs: LMP  (LMP Unknown)   Physical Exam She is alert and orient x3 and in no acute distress Ortho Exam Both knees show global tenderness but good range of motion.  Both knees have signs of arthritis and patellofemoral crepitation.  Both knees are ligamentously stable.  They have slight valgus malalignment. Specialty Comments:  No specialty comments available.  Imaging: No results found.   PMFS History: Patient Active Problem List   Diagnosis Date Noted   Aortic valve sclerosis 02/24/2021   Varicose veins of both lower extremities 02/24/2021   Left knee pain 04/28/2015   Coronary artery disease involving native coronary artery of native heart without angina pectoris 11/22/2013   Essential hypertension 11/22/2013   Pure hypercholesterolemia 11/22/2013   Inversion, nipple 09/05/2013   Shingles    Elevated cholesterol    Cancer ([place])    Right knee pain 05/10/2011   Past Medical History:  Diagnosis  Date   Aortic valve sclerosis    CAD (coronary artery disease)    S/p DES to the proximal RCA, POBA to the distal RCA 8/08 //Myoview 2009 low risk // Myoview 9/22: EF 57, no ischemia or infarction, apical thinning artifact, low risk study   DUB (dysfunctional uterine bleeding)    Elevated cholesterol    Atorvastatin   GERD (gastroesophageal reflux disease)    History of echocardiogram    EF 60-65, GRII DD, GLS -18.4, normal RVSF, moderate MAC, moderate AV calcification, trivial AI   Hypertension    Plantar fasciitis    Shingles     Family History  Problem Relation Age of Onset   Heart attack Mother    Hypertension Mother    Heart attack Father    CAD Father    Heart disease Brother    Hypertension Brother    Diabetes Brother    COPD Brother    Asthma Brother    Breast cancer Neg Hx     Past Surgical History:  Procedure Laterality Date   APPENDECTOMY  2011   CARDIAC STINT IMPLANT  2008   CARPAL TUNNEL RELEASE     EYE SURGERY     INGUINAL HERNIA REPAIR     OOPHORECTOMY  2001   [place] RELEASE  2009   VAGINAL HYSTERECTOMY  2001   LAVH, BSO   Social History   Occupational History   Not on file  Tobacco Use   Smoking status: Former   Smokeless tobacco: Never  Vaping Use   Vaping Use: Never used  Substance and Sexual Activity   Alcohol use: No    Alcohol/week: 0.0 standard drinks of alcohol   Drug use: No   Sexual activity: Never    Birth control/protection: Post-menopausal, Surgical    Comment: INTERCOURSE AGE 33, SEXUAL PARTNERS LEWSS THAN 5

## 2021-10-07 NOTE — Telephone Encounter (Signed)
Bilateral gel injections  

## 2021-10-09 ENCOUNTER — Ambulatory Visit (HOSPITAL_COMMUNITY): Payer: Medicare HMO | Attending: Internal Medicine

## 2021-10-09 DIAGNOSIS — I358 Other nonrheumatic aortic valve disorders: Secondary | ICD-10-CM | POA: Insufficient documentation

## 2021-10-09 DIAGNOSIS — R0602 Shortness of breath: Secondary | ICD-10-CM | POA: Diagnosis present

## 2021-10-10 LAB — ECHOCARDIOGRAM COMPLETE
Area-P 1/2: 2.76 cm2
S' Lateral: 2.4 cm

## 2021-10-12 NOTE — Telephone Encounter (Signed)
Noted  

## 2021-10-13 NOTE — Telephone Encounter (Signed)
VOB submitted for SynviscOne, bilateral knee  

## 2021-10-21 ENCOUNTER — Telehealth: Payer: Self-pay

## 2021-10-21 NOTE — Telephone Encounter (Signed)
PA for SYnviscOne, bilateral knee, faxed to [facility] at [phone]. PA pending

## 2021-11-02 ENCOUNTER — Telehealth: Payer: Self-pay | Admitting: Orthopaedic Surgery

## 2021-11-02 ENCOUNTER — Other Ambulatory Visit: Payer: Self-pay

## 2021-11-02 DIAGNOSIS — G8929 Other chronic pain: Secondary | ICD-10-CM

## 2021-11-02 NOTE — Telephone Encounter (Signed)
Patient called. Says she got approval for her gel injections. Her call back number is [phone]

## 2021-11-02 NOTE — Telephone Encounter (Signed)
Talked with patient and appointment has been scheduled for gel injection.  

## 2021-11-04 ENCOUNTER — Ambulatory Visit (INDEPENDENT_AMBULATORY_CARE_PROVIDER_SITE_OTHER): Payer: Medicare HMO | Admitting: Physician Assistant

## 2021-11-04 ENCOUNTER — Encounter: Payer: Self-pay | Admitting: Physician Assistant

## 2021-11-04 DIAGNOSIS — M1712 Unilateral primary osteoarthritis, left knee: Secondary | ICD-10-CM

## 2021-11-04 DIAGNOSIS — M1711 Unilateral primary osteoarthritis, right knee: Secondary | ICD-10-CM | POA: Diagnosis not present

## 2021-11-04 MED ORDER — HYLAN G-F 20 48 MG/6ML IX SOSY
48.0000 mg | PREFILLED_SYRINGE | INTRA_ARTICULAR | Status: AC | PRN
  Administered 2021-11-04: 48 mg via INTRA_ARTICULAR

## 2021-11-04 MED ORDER — LIDOCAINE HCL 1 % IJ SOLN
3.0000 mL | INTRAMUSCULAR | Status: AC | PRN
  Administered 2021-11-04: 3 mL

## 2021-11-04 NOTE — Progress Notes (Signed)
   Procedure Note  Patient: [name]             Date of Birth: [birth year]           MRN: [id number]             Visit Date: 11/04/2021 HPI: Ms. Palecek comes in today for scheduled Synvisc 1 injections both knees.  She has had no new injury to either knee.  She has known patellofemoral arthritis of both knees.  She has no scheduled surgery and does not want any type of surgical intervention on either knee.  Review of systems: See HPI otherwise negative.  Physical exam: General well-developed well-nourished female no acute distress. Bilateral knees: No abnormal warmth erythema or effusion of either knee.  Good range of motion both knees.  Procedures: Visit Diagnoses:  1. Patellofemoral arthritis of left knee   2. Patellofemoral arthritis of right knee     Large Joint Inj: bilateral knee on 11/04/2021 5:03 PM Indications: pain Details: 22 G 1.5 in needle, anterolateral approach  Arthrogram: No  Medications (Right): 3 mL lidocaine 1 %; 48 mg Hylan 48 MG/6ML Medications (Left): 3 mL lidocaine 1 %; 48 mg Hylan 48 MG/6ML Outcome: tolerated well, no immediate complications Procedure, treatment alternatives, risks and benefits explained, specific risks discussed. Consent was given by the patient. Immediately prior to procedure a time out was called to verify the correct patient, procedure, equipment, support staff and site/side marked as required. Patient was prepped and draped in the usual sterile fashion.     Plan: She knows to wait at least 6 months between supplemental injections.  She will follow-up as needed.  Questions encouraged and answered.

## 2021-11-13 ENCOUNTER — Telehealth: Payer: Self-pay | Admitting: Family Medicine

## 2021-11-13 NOTE — Telephone Encounter (Signed)
Medication: clorazepate (TRANXENE) 7.5 MG tablet [[id number]]   *pt wants to know if she can get enough to hold her over until her appt on 11/23/21   Preferred Pharmacy (with phone number or street name): [facility], [place] - [address]  [address], [place] [address]  Phone:  [phone]  Fax:  [phone]   Agent: Please be advised that RX refills may take up to 3 business days. We ask that you follow-up with your pharmacy.

## 2021-11-18 ENCOUNTER — Other Ambulatory Visit: Payer: Self-pay | Admitting: Family

## 2021-11-18 DIAGNOSIS — F419 Anxiety disorder, unspecified: Secondary | ICD-10-CM

## 2021-11-18 MED ORDER — CLORAZEPATE DIPOTASSIUM 7.5 MG PO TABS: 7.5000 mg | ORAL_TABLET | Freq: Once | ORAL | 0 refills | Status: DC | PRN

## 2021-11-18 NOTE — Telephone Encounter (Signed)
Pt called to follow up on med refill. Advised pt that there was no additional information at this time and a note would be sent back to look into this. Pt acknowledged understanding.

## 2021-11-23 ENCOUNTER — Other Ambulatory Visit (HOSPITAL_BASED_OUTPATIENT_CLINIC_OR_DEPARTMENT_OTHER): Payer: Self-pay | Admitting: Family Medicine

## 2021-11-23 ENCOUNTER — Ambulatory Visit (INDEPENDENT_AMBULATORY_CARE_PROVIDER_SITE_OTHER): Payer: Medicare HMO | Admitting: Family

## 2021-11-23 VITALS — BP 129/65 | HR 66 | Temp 97.6°F | Resp 16 | Wt 156.0 lb

## 2021-11-23 DIAGNOSIS — Z23 Encounter for immunization: Secondary | ICD-10-CM

## 2021-11-23 DIAGNOSIS — F419 Anxiety disorder, unspecified: Secondary | ICD-10-CM

## 2021-11-23 DIAGNOSIS — I10 Essential (primary) hypertension: Secondary | ICD-10-CM | POA: Diagnosis not present

## 2021-11-23 DIAGNOSIS — Z1231 Encounter for screening mammogram for malignant neoplasm of breast: Secondary | ICD-10-CM

## 2021-11-23 MED ORDER — CLORAZEPATE DIPOTASSIUM 3.75 MG PO TABS
3.7500 mg | ORAL_TABLET | Freq: Two times a day (BID) | ORAL | 1 refills | Status: DC | PRN
Start: 2021-11-23 — End: 2022-02-23

## 2021-11-23 NOTE — Progress Notes (Signed)
Subjective:   By signing my name below, I, Shehryar Baig, attest that this documentation has been prepared under the direction and in the presence of Debbrah Alar, NP 11/23/2021    Patient ID: [name], female    DOB: 1926/09/07, 88 y.o.   MRN: [id number]  Chief Complaint  Patient presents with   Anxiety    Follow up, "needs refill on tranxene"   Joint Swelling    Complains of ankles swelling   Bleeding/Bruising    Patient reports unexplained bruise on right leg    Anxiety     Patient is in today for a office visit.   Ankle swelling- She complains of bilateral ankle swelling in. She has no swelling in the morning after waking up. She continues taking 5 mg Norvasc. She is not interested in taking fluid medication due to causing her to urinate more frequently.  BP Readings from Last 3 Encounters:  11/23/21 129/65  09/22/21 (!) 122/52  07/06/21 118/70   Pulse Readings from Last 3 Encounters:  11/23/21 66  09/22/21 68  07/06/21 62   Wt Readings from Last 3 Encounters:  11/23/21 156 lb (70.8 kg)  09/22/21 155 lb 12.8 oz (70.7 kg)  07/06/21 155 lb 9.6 oz (70.6 kg)   Bruising- She reports having bruising on the back of her right calf. She does not remember receiving an injury to cause this. She has had  bruising in the past and notes she typically can feel them.   Tranxene- She is requesting a refill on Tranxene. She is requesting to reduce the dosage to 3.75. She mainly uses it to manage her anxiety.    Health Maintenance Due  Topic Date Due   DEXA SCAN  Never done   Zoster Vaccines- Shingrix (2 of 2) 02/09/2019   COVID-19 Vaccine (4 - Pfizer risk series) 02/06/2020    Past Medical History:  Diagnosis Date   Aortic valve sclerosis    CAD (coronary artery disease)    S/p DES to the proximal RCA, POBA to the distal RCA 8/08 //Myoview 2009 low risk // Myoview 9/22: EF 57, no ischemia or infarction, apical thinning artifact, low risk study   DUB  (dysfunctional uterine bleeding)    Elevated cholesterol    Atorvastatin   GERD (gastroesophageal reflux disease)    History of echocardiogram    EF 60-65, GRII DD, GLS -18.4, normal RVSF, moderate MAC, moderate AV calcification, trivial AI   Hypertension    Plantar fasciitis    Shingles     Past Surgical History:  Procedure Laterality Date   APPENDECTOMY  2011   CARDIAC STINT IMPLANT  2008   CARPAL TUNNEL RELEASE     EYE SURGERY     INGUINAL HERNIA REPAIR     OOPHORECTOMY  2001   [place] RELEASE  2009   VAGINAL HYSTERECTOMY  2001   LAVH, BSO    Family History  Problem Relation Age of Onset   Heart attack Mother    Hypertension Mother    Heart attack Father    CAD Father    Heart disease Brother    Hypertension Brother    Diabetes Brother    COPD Brother    Asthma Brother    Breast cancer Neg Hx     Social History   Socioeconomic History   Marital status: Widowed    Spouse name: Not on file   Number of children: Not on file   Years of  education: Not on file   Highest education level: Not on file  Occupational History   Not on file  Tobacco Use   Smoking status: Former   Smokeless tobacco: Never  Vaping Use   Vaping Use: Never used  Substance and Sexual Activity   Alcohol use: No    Alcohol/week: 0.0 standard drinks of alcohol   Drug use: No   Sexual activity: Never    Birth control/protection: Post-menopausal, Surgical    Comment: INTERCOURSE AGE 63, SEXUAL PARTNERS LEWSS THAN 5  Other Topics Concern   Not on file  Social History Narrative   Not on file   Social Determinants of Health   Financial Resource Strain: Not on file  Food Insecurity: Not on file  Transportation Needs: Not on file  Physical Activity: Not on file  Stress: Not on file  Social Connections: Not on file  Intimate Partner Violence: Not on file    Outpatient Medications Prior to Visit  Medication Sig Dispense Refill   Ascorbic Acid (VITAMIN C) 1000 MG  tablet Take 1,000 mg by mouth daily.     aspirin 81 MG tablet Take 81 mg by mouth daily.       atorvastatin (LIPITOR) 40 MG tablet Take 40 mg by mouth daily.      cetirizine (ZYRTEC) 10 MG tablet Take 10 mg by mouth daily.       Cholecalciferol (VITAMIN D) 2000 units CAPS Take 2,000 Units by mouth daily.     Coenzyme Q10 (CO Q 10 PO) Take 1 tablet by mouth every morning.     diclofenac Sodium (VOLTAREN) 1 % GEL Apply 4 g topically 4 (four) times daily. 350 g 2   fluticasone (FLONASE) 50 MCG/ACT nasal spray Place 2 sprays into both nostrils daily. 16 g 6   lisinopril (PRINIVIL,ZESTRIL) 40 MG tablet Take 40 mg by mouth daily.     pantoprazole (PROTONIX) 40 MG tablet Take 40 mg by mouth daily.      clorazepate (TRANXENE) 7.5 MG tablet Take 1 tablet (7.5 mg total) by mouth once as needed. For nerves 15 tablet 0   amLODipine (NORVASC) 5 MG tablet Take 1 tablet (5 mg total) by mouth daily. 90 tablet 3   nitroGLYCERIN (NITROSTAT) 0.4 MG SL tablet Place 1 tablet (0.4 mg total) under the tongue every 5 (five) minutes as needed for chest pain. 25 tablet 3   No facility-administered medications prior to visit.    Allergies  Allergen Reactions   Sulfa Antibiotics Nausea And Vomiting    Review of Systems  Endo/Heme/Allergies:  Bruises/bleeds easily (bruise on left calf).       Objective:    Physical Exam Constitutional:      General: She is not in acute distress.    Appearance: Normal appearance. She is not ill-appearing.  HENT:     Head: Normocephalic and atraumatic.     Right Ear: External ear normal.     Left Ear: External ear normal.  Eyes:     Extraocular Movements: Extraocular movements intact.     Pupils: Pupils are equal, round, and reactive to light.  Cardiovascular:     Rate and Rhythm: Normal rate and regular rhythm.     Heart sounds: Normal heart sounds. No murmur heard.    No gallop.  Pulmonary:     Effort: Pulmonary effort is normal. No respiratory distress.     Breath  sounds: Normal breath sounds. No wheezing or rales.  Skin:    General: Skin  is warm and dry.  Neurological:     Mental Status: She is alert and oriented to person, place, and time.  Psychiatric:        Judgment: Judgment normal.     BP 129/65   Pulse 66   Temp 97.6 F (36.4 C) (Oral)   Resp 16   Wt 156 lb (70.8 kg)   LMP  (LMP Unknown)   SpO2 98%   BMI 26.78 kg/m  Wt Readings from Last 3 Encounters:  11/23/21 156 lb (70.8 kg)  09/22/21 155 lb 12.8 oz (70.7 kg)  07/06/21 155 lb 9.6 oz (70.6 kg)       Assessment & Plan:   Problem List Items Addressed This Visit       Unprioritized   Essential hypertension (Chronic)    BP Readings from Last 3 Encounters:  11/23/21 129/65  09/22/21 (!) 122/52  07/06/21 118/70   Wt Readings from Last 3 Encounters:  11/23/21 156 lb (70.8 kg)  09/22/21 155 lb 12.8 oz (70.7 kg)  07/06/21 155 lb 9.6 oz (70.6 kg)        Other Visit Diagnoses     Needs flu shot    -  Primary   Relevant Orders   Flu Vaccine QUAD High Dose(Fluad) (Completed)        Meds ordered this encounter  Medications   clorazepate (TRANXENE) 3.75 MG tablet    Sig: Take 1 tablet (3.75 mg total) by mouth 2 (two) times daily as needed for anxiety.    Dispense:  30 tablet    Refill:  1    Order Specific Question:   Supervising Provider    Answer:   Penni Homans A [[id number]]    I, Nance Pear, NP, personally preformed the services described in this documentation.  All medical record entries made by the scribe were at my direction and in my presence.  I have reviewed the chart and discharge instructions (if applicable) and agree that the record reflects my personal performance and is accurate and complete. 11/23/2021   I,Shehryar Baig,acting as a Education administrator for Nance Pear, NP.,have documented all relevant documentation on the behalf of Nance Pear, NP,as directed by  Nance Pear, NP while in the presence of Nance Pear,  NP.   Nance Pear, NP

## 2021-11-23 NOTE — Assessment & Plan Note (Signed)
BP Readings from Last 3 Encounters:  11/23/21 129/65  09/22/21 (!) 122/52  07/06/21 118/70   Wt Readings from Last 3 Encounters:  11/23/21 156 lb (70.8 kg)  09/22/21 155 lb 12.8 oz (70.7 kg)  07/06/21 155 lb 9.6 oz (70.6 kg)   Edema is likely worsened by amlodipine.  However, her HR is too low to initiate beta blocker.  She declines diuretic. Already on ACE. She is ok with continuing amlodipine for now.

## 2021-11-24 DIAGNOSIS — F419 Anxiety disorder, unspecified: Secondary | ICD-10-CM | POA: Insufficient documentation

## 2021-11-24 NOTE — Assessment & Plan Note (Signed)
Stable on once daily Tranxene which she has been on for >50 years.  A controlled substance contract is signed today and [place] Controlled substance registry is reviewed.

## 2021-11-25 ENCOUNTER — Encounter (HOSPITAL_BASED_OUTPATIENT_CLINIC_OR_DEPARTMENT_OTHER): Payer: Self-pay

## 2021-11-25 ENCOUNTER — Ambulatory Visit (HOSPITAL_BASED_OUTPATIENT_CLINIC_OR_DEPARTMENT_OTHER)
Admission: RE | Admit: 2021-11-25 | Discharge: 2021-11-25 | Disposition: A | Discharge status: Home / Self Care | Payer: Medicare HMO | Source: Ambulatory Visit | Attending: Family Medicine | Admitting: Family Medicine

## 2021-11-25 DIAGNOSIS — Z1231 Encounter for screening mammogram for malignant neoplasm of breast: Secondary | ICD-10-CM

## 2021-12-25 ENCOUNTER — Telehealth (HOSPITAL_BASED_OUTPATIENT_CLINIC_OR_DEPARTMENT_OTHER): Payer: Self-pay

## 2021-12-29 DIAGNOSIS — H903 Sensorineural hearing loss, bilateral: Secondary | ICD-10-CM | POA: Insufficient documentation

## 2021-12-31 ENCOUNTER — Telehealth: Payer: Self-pay | Admitting: Family Medicine

## 2021-12-31 ENCOUNTER — Other Ambulatory Visit: Payer: Self-pay | Admitting: *Deleted

## 2021-12-31 MED ORDER — LISINOPRIL 40 MG PO TABS: 40.0000 mg | ORAL_TABLET | Freq: Every day | ORAL | 1 refills | Status: DC

## 2021-12-31 NOTE — Telephone Encounter (Signed)
Refill sent to pharmacy.   

## 2021-12-31 NOTE — Telephone Encounter (Signed)
Rx given by previous dr, she would like pcp to fill it for 90 days.   Medication: lisinopril (PRINIVIL,ZESTRIL) 40 MG tablet  Has the patient contacted their pharmacy? No.   Preferred Pharmacy:  [facility], [place] - [address], [place] [address] Phone: [phone]  Fax: [phone]

## 2022-01-12 ENCOUNTER — Encounter (HOSPITAL_BASED_OUTPATIENT_CLINIC_OR_DEPARTMENT_OTHER): Payer: Self-pay

## 2022-01-12 ENCOUNTER — Ambulatory Visit (HOSPITAL_BASED_OUTPATIENT_CLINIC_OR_DEPARTMENT_OTHER)
Admission: RE | Admit: 2022-01-12 | Discharge: 2022-01-12 | Disposition: A | Discharge status: Home / Self Care | Payer: Medicare HMO | Source: Ambulatory Visit | Attending: Family Medicine | Admitting: Family Medicine

## 2022-01-12 DIAGNOSIS — Z1231 Encounter for screening mammogram for malignant neoplasm of breast: Secondary | ICD-10-CM | POA: Insufficient documentation

## 2022-01-21 ENCOUNTER — Other Ambulatory Visit: Payer: Self-pay | Admitting: *Deleted

## 2022-01-21 ENCOUNTER — Telehealth: Payer: Self-pay | Admitting: Family Medicine

## 2022-01-21 MED ORDER — AMLODIPINE BESYLATE 5 MG PO TABS: 5.0000 mg | ORAL_TABLET | Freq: Every day | ORAL | 1 refills | Status: DC

## 2022-01-21 MED ORDER — PANTOPRAZOLE SODIUM 40 MG PO TBEC: 40.0000 mg | DELAYED_RELEASE_TABLET | Freq: Every day | ORAL | 3 refills | Status: DC

## 2022-01-21 MED ORDER — ATORVASTATIN CALCIUM 40 MG PO TABS: 40.0000 mg | ORAL_TABLET | Freq: Every day | ORAL | 1 refills | Status: DC

## 2022-01-21 NOTE — Telephone Encounter (Signed)
Refills sent to pharmacy. 

## 2022-01-21 NOTE — Telephone Encounter (Signed)
Medication:   amLODipine (NORVASC) 5 MG tablet [[id number]]   pantoprazole (PROTONIX) 40 MG tablet [[id number]]   atorvastatin (LIPITOR) 40 MG tablet [[id number]]   Has the patient contacted their pharmacy? No. (If no, request that the patient contact the pharmacy for the refill.) (If yes, when and what did the pharmacy advise?)  Preferred Pharmacy (with phone number or street name):   [facility], [place] - [address], [place] [address] Phone: [phone]  Fax: [phone]    Agent: Please be advised that RX refills may take up to 3 business days. We ask that you follow-up with your pharmacy.

## 2022-02-08 ENCOUNTER — Ambulatory Visit (INDEPENDENT_AMBULATORY_CARE_PROVIDER_SITE_OTHER): Payer: Medicare HMO | Admitting: Orthopaedic Surgery

## 2022-02-08 ENCOUNTER — Encounter: Payer: Self-pay | Admitting: Orthopaedic Surgery

## 2022-02-08 DIAGNOSIS — M25561 Pain in right knee: Secondary | ICD-10-CM | POA: Diagnosis not present

## 2022-02-08 DIAGNOSIS — G8929 Other chronic pain: Secondary | ICD-10-CM | POA: Diagnosis not present

## 2022-02-08 DIAGNOSIS — M1712 Unilateral primary osteoarthritis, left knee: Secondary | ICD-10-CM | POA: Diagnosis not present

## 2022-02-08 DIAGNOSIS — M1711 Unilateral primary osteoarthritis, right knee: Secondary | ICD-10-CM | POA: Diagnosis not present

## 2022-02-08 DIAGNOSIS — M25562 Pain in left knee: Secondary | ICD-10-CM | POA: Diagnosis not present

## 2022-02-08 MED ORDER — METHYLPREDNISOLONE ACETATE 40 MG/ML IJ SUSP
40.0000 mg | INTRAMUSCULAR | Status: AC | PRN
  Administered 2022-02-08: 40 mg via INTRA_ARTICULAR

## 2022-02-08 MED ORDER — LIDOCAINE HCL 1 % IJ SOLN
3.0000 mL | INTRAMUSCULAR | Status: AC | PRN
  Administered 2022-02-08: 3 mL

## 2022-02-08 NOTE — Progress Notes (Signed)
The patient is a 88 year old well-known to [place].  She has arthritis in both her knees.  She last had hyaluronic acid injections in both knees in August.  She has been having some pain in her knees and would like to have steroid injections today.  We talked about a home exercise program with strengthening her knees.  We also talked about the possibility of outpatient physical therapy.  She has been to therapy before and wants to consider this however she feels like she could try the home exercises again.  Both her knees are assessed today and found to be ligamentously stable but painful throughout arc of motion with mainly patellofemoral arthritic pain.  I did place a steroid injection of both knees today without difficulty.  I encouraged her to try home exercises first.  I told her to give it a few weeks but if her knees or not feeling stronger that she should call us to have outpatient physical therapy set up for her.  This could be at [facility] location.      Procedure Note  Patient: [name]             Date of Birth: 1926/07/01           MRN: [id number]             Visit Date: 02/08/2022  Procedures: Visit Diagnoses:  1. Chronic pain of left knee   2. Chronic pain of right knee   3. Patellofemoral arthritis of left knee   4. Patellofemoral arthritis of right knee     Large Joint Inj: R knee on 02/08/2022 2:25 PM Indications: diagnostic evaluation and pain Details: 22 G 1.5 in needle, superolateral approach  Arthrogram: No  Medications: 3 mL lidocaine 1 %; 40 mg methylPREDNISolone acetate 40 MG/ML Outcome: tolerated well, no immediate complications Procedure, treatment alternatives, risks and benefits explained, specific risks discussed. Consent was given by the patient. Immediately prior to procedure a time out was called to verify the correct patient, procedure, equipment, support staff and site/side marked as required. Patient was prepped and draped in the  usual sterile fashion.    Large Joint Inj: L knee on 02/08/2022 2:25 PM Indications: diagnostic evaluation and pain Details: 22 G 1.5 in needle, superolateral approach  Arthrogram: No  Medications: 3 mL lidocaine 1 %; 40 mg methylPREDNISolone acetate 40 MG/ML Outcome: tolerated well, no immediate complications Procedure, treatment alternatives, risks and benefits explained, specific risks discussed. Consent was given by the patient. Immediately prior to procedure a time out was called to verify the correct patient, procedure, equipment, support staff and site/side marked as required. Patient was prepped and draped in the usual sterile fashion.

## 2022-02-09 ENCOUNTER — Telehealth: Payer: Self-pay | Admitting: *Deleted

## 2022-02-09 NOTE — Telephone Encounter (Signed)
Relationship To Patient Self Return Phone Number [phone] (Secondary) Chief Complaint Blood Pressure High Reason for Call Symptomatic / Request for Health Information Initial Comment Caller stated that they are having high blood pressure, BP is 168/80, face is flushed. Caller sees Caleen Jobs, office confirmed. Translation No Nurse Assessment Nurse: Noralee Stain, RN, Christina Date/Time (Eastern Time): 02/09/2022 2:28:37 PM Confirm and document reason for call. If symptomatic, describe symptoms. ---Caller stated that they are having high blood pressure, BP is 168/80. Pts face is flushed. 122/76 a few days ago. Current 159/78 with a pulse of 81  Comments User: Aletta Edouard, RN Date/Time Eilene Ghazi Time): 02/09/2022 2:40:26 PM Transferred back to main office line for appt Referrals REFERRED TO PCP OFFICE

## 2022-02-22 ENCOUNTER — Telehealth: Payer: Self-pay | Admitting: Family Medicine

## 2022-02-22 DIAGNOSIS — F419 Anxiety disorder, unspecified: Secondary | ICD-10-CM

## 2022-02-22 NOTE — Telephone Encounter (Signed)
Medication: clorazepate (TRANXENE) 3.75 MG tablet She would like to get 90 tablets  Has the patient contacted their pharmacy? Yes.     Preferred Pharmacy:   [facility], [place] - [address], [place] [address] Phone: [phone]  Fax: [phone]

## 2022-02-23 MED ORDER — CLORAZEPATE DIPOTASSIUM 3.75 MG PO TABS: 3.7500 mg | ORAL_TABLET | Freq: Two times a day (BID) | ORAL | 0 refills | Status: DC | PRN

## 2022-02-23 NOTE — Telephone Encounter (Signed)
Put Need for UDS/Contract on appt. Note for reminder

## 2022-03-02 ENCOUNTER — Encounter (HOSPITAL_BASED_OUTPATIENT_CLINIC_OR_DEPARTMENT_OTHER): Payer: Self-pay | Admitting: Cardiology

## 2022-03-02 NOTE — Telephone Encounter (Signed)
Routing to correct pool.

## 2022-03-24 ENCOUNTER — Encounter: Payer: Self-pay | Admitting: Cardiology

## 2022-03-24 ENCOUNTER — Ambulatory Visit: Payer: Medicare HMO | Attending: Cardiology | Admitting: Cardiology

## 2022-03-24 VITALS — BP 130/60 | HR 57 | Ht 64.0 in | Wt 158.0 lb

## 2022-03-24 DIAGNOSIS — I10 Essential (primary) hypertension: Secondary | ICD-10-CM | POA: Diagnosis not present

## 2022-03-24 DIAGNOSIS — I2510 Atherosclerotic heart disease of native coronary artery without angina pectoris: Secondary | ICD-10-CM | POA: Diagnosis not present

## 2022-03-24 DIAGNOSIS — R29898 Other symptoms and signs involving the musculoskeletal system: Secondary | ICD-10-CM

## 2022-03-24 NOTE — Progress Notes (Signed)
Cardiology Office Note:    Date:  03/24/2022   ID:  [name], DOB [birth year], MRN [id number]  PCP:  Terrilyn Saver, NP  [facility] Cardiologist:  Candee Furbish, MD  [facility] Electrophysiologist:  None   Referring MD: Terrilyn Saver, NP     History of Present Illness:    [name] is a 89 y.o. female here for the follow-up of coronary artery disease with prior DES to proximal RCA in August 2008. In 2009 she had a stress test that showed no ischemia. Normal ejection fraction.  Occasionally she may have some throat discomfort with heavy exertional activity but this seems to be a rare occurrence.  Back in 2020, her husband, [name], also a patient of ours had died on hospice.   Left leg, foot warm. Feels like dragging her leg. No numbness. Sometimes pain lateral lower leg. No falls.   Overall she been doing quite well.  In fact she went with her daughter previously on her 72th birthday to [facility] and stated [employer].  They enjoyed magician's act there.  Denies any recent chest discomfort.  She did ask about discoloration in her lower extremities.  Varicose veins.  She denies any fevers chills nausea vomiting syncope.  Past Medical History:  Diagnosis Date   Aortic valve sclerosis    CAD (coronary artery disease)    S/p DES to the proximal RCA, POBA to the distal RCA 8/08 //Myoview 2009 low risk // Myoview 9/22: EF 57, no ischemia or infarction, apical thinning artifact, low risk study   DUB (dysfunctional uterine bleeding)    Elevated cholesterol    Atorvastatin   GERD (gastroesophageal reflux disease)    History of echocardiogram    EF 60-65, GRII DD, GLS -18.4, normal RVSF, moderate MAC, moderate AV calcification, trivial AI   Hypertension    Plantar fasciitis    Shingles     Past Surgical History:  Procedure Laterality Date   APPENDECTOMY  2011   CARDIAC STINT IMPLANT  2008   CARPAL TUNNEL RELEASE     EYE SURGERY     INGUINAL HERNIA REPAIR      OOPHORECTOMY  2001   LAVH,BSO   ULNA NERVE RELEASE  2009   VAGINAL HYSTERECTOMY  2001   LAVH, BSO    Current Medications: Current Meds  Medication Sig   amLODipine (NORVASC) 5 MG tablet Take 1 tablet (5 mg total) by mouth daily.   Ascorbic Acid (VITAMIN C) 1000 MG tablet Take 1,000 mg by mouth daily.   aspirin 81 MG tablet Take 81 mg by mouth daily.     atorvastatin (LIPITOR) 40 MG tablet Take 1 tablet (40 mg total) by mouth daily.   cetirizine (ZYRTEC) 10 MG tablet Take 10 mg by mouth daily.     Cholecalciferol (VITAMIN D) 2000 units CAPS Take 2,000 Units by mouth daily.   clorazepate (TRANXENE) 3.75 MG tablet Take 1 tablet (3.75 mg total) by mouth 2 (two) times daily as needed for anxiety.   Coenzyme Q10 (CO Q 10 PO) Take 1 tablet by mouth every morning.   diclofenac Sodium (VOLTAREN) 1 % GEL Apply 4 g topically 4 (four) times daily. (Patient taking differently: Apply 4 g topically as needed (pain).)   fluticasone (FLONASE) 50 MCG/ACT nasal spray Place 2 sprays into both nostrils daily. (Patient taking differently: Place 2 sprays into both nostrils as needed for allergies.)   Glucosamine-Chondroit-Vit C-Mn (GLUCOSAMINE CHONDR 1500 COMPLX PO) Take by mouth daily  at 6 (six) AM.   lisinopril (ZESTRIL) 40 MG tablet Take 1 tablet (40 mg total) by mouth daily.   nitroGLYCERIN (NITROSTAT) 0.4 MG SL tablet Place 1 tablet (0.4 mg total) under the tongue every 5 (five) minutes as needed for chest pain.   pantoprazole (PROTONIX) 40 MG tablet Take 1 tablet (40 mg total) by mouth daily.     Allergies:   Patient has no active allergies.   Social History   Socioeconomic History   Marital status: Widowed    Spouse name: Not on file   Number of children: Not on file   Years of education: Not on file   Highest education level: Not on file  Occupational History   Not on file  Tobacco Use   Smoking status: Former   Smokeless tobacco: Never  Vaping Use   Vaping Use: Never used  Substance and  Sexual Activity   Alcohol use: No    Alcohol/week: 0.0 standard drinks of alcohol   Drug use: No   Sexual activity: Never    Birth control/protection: Post-menopausal, Surgical    Comment: INTERCOURSE AGE 19, SEXUAL PARTNERS LEWSS THAN 5  Other Topics Concern   Not on file  Social History Narrative   Not on file   Social Determinants of Health   Financial Resource Strain: Not on file  Food Insecurity: Not on file  Transportation Needs: Not on file  Physical Activity: Not on file  Stress: Not on file  Social Connections: Not on file     Family History: The patient's family history includes Asthma in her brother; CAD in her father; COPD in her brother; Diabetes in her brother; Heart attack in her father and mother; Heart disease in her brother; Hypertension in her brother and mother. There is no history of Breast cancer.  ROS:   Please see the history of present illness.     All other systems reviewed and are negative.  EKGs/Labs/Other Studies Reviewed:    The following studies were reviewed today:  ECHO 2020  1. The left ventricle has normal systolic function with an ejection  fraction of 60-65%. The cavity size was normal. Left ventricular diastolic  Doppler parameters are consistent with pseudonormalization. Elevated left  ventricular end-diastolic pressure.   2. Normal GLS -18.4.   3. The right ventricle has normal systolic function. The cavity was  normal. There is no increase in right ventricular wall thickness.   4. Moderate thickening of the mitral valve leaflet. Moderate  calcification of the mitral valve leaflet. There is moderate mitral  annular calcification present.   5. The aortic valve is tricuspid Moderate thickening of the aortic valve  Moderate calcification of the aortic valve. Aortic valve regurgitation is  trivial by color flow Doppler.   ECHO 2023:  1. Left ventricular ejection fraction by 3D volume is 60 %. The left  ventricle has normal  function. The left ventricle has no regional wall  motion abnormalities. There is mild left ventricular hypertrophy. Left  ventricular diastolic parameters are  consistent with Grade I diastolic dysfunction (impaired relaxation). The  average left ventricular global longitudinal strain is -20.8 %. The global  longitudinal strain is normal.   2. Right ventricular systolic function is normal. The right ventricular  size is normal. There is normal pulmonary artery systolic pressure. The  estimated right ventricular systolic pressure is 18.5 mmHg.   3. The mitral valve is degenerative. Trivial mitral valve regurgitation.  No evidence of mitral stenosis.   4.  The aortic valve is grossly normal. There is mild calcification of the  aortic valve. Aortic valve regurgitation is trivial. Aortic valve  sclerosis/calcification is present, without any evidence of aortic  stenosis.   5. The inferior vena cava is normal in size with greater than 50%  respiratory variability, suggesting right atrial pressure of 3 mmHg.   EKG: 03/24/2022-sinus bradycardia 57 no other abnormalities.  Sinus rhythm 63 no other abnormalities  Recent Labs: 04/14/2021: TSH 3.33 05/07/2021: Hemoglobin 12.3; Platelets 208 06/02/2021: ALT 24 09/25/2021: BUN 17; Creatinine, Ser 0.89; NT-Pro BNP 101; Potassium 4.7; Sodium 134  Recent Lipid Panel    Component Value Date/Time   CHOL 158 05/07/2021 0000   TRIG 38 (A) 05/07/2021 0000   HDL 89 (A) 05/07/2021 0000   LDLCALC 60 05/07/2021 0000     Risk Assessment/Calculations:       Physical Exam:    VS:  BP 130/60   Pulse (!) 57   Ht _0  (1.626 m)   Wt 158 lb (71.7 kg)   LMP  (LMP Unknown)   SpO2 96%   BMI 27.12 kg/m     Wt Readings from Last 3 Encounters:  03/24/22 158 lb (71.7 kg)  11/23/21 156 lb (70.8 kg)  09/22/21 155 lb 12.8 oz (70.7 kg)     GEN:  Well nourished, well developed in no acute distress HEENT: Normal NECK: No JVD; No carotid bruits LYMPHATICS:  No lymphadenopathy CARDIAC: RRR, 2/6 systolic murmur, rubs, gallops RESPIRATORY:  Clear to auscultation without rales, wheezing or rhonchi  ABDOMEN: Soft, non-tender, non-distended MUSCULOSKELETAL: Mild left greater than right edema; No deformity  SKIN: Warm and dry, varicose NEUROLOGIC:  Alert and oriented x 3 PSYCHIATRIC:  Normal affect   ASSESSMENT:    1. Coronary artery disease involving native coronary artery of native heart without angina pectoris   2. Essential hypertension   3. Left leg weakness     PLAN:    In order of problems listed above:  Coronary artery disease -DES to RCA doing very well. Minimal throat tightness which may be anginal equivalent however this is rare. Continue with aspirin atorvastatin high intensity at 40 mg.  No changes made  Hyperlipidemia -Atorvastatin 40 mg once a day, LDL 50 at last check from outside office.  Excellent  Essential hypertension -Well-controlled today. Continue with amlodipine 5 mg and lisinopril 40 mg.  Heart murmur -Likely from aortic sclerosis but no significant stenosis.  Echo report reviewed as above  Varicose veins -She commented on the discoloration of her lower extremities.  Discussed that this was from venous insufficiency/varicosities.    Lower leg weakness, mild discomfort - Left greater than right when walking.  We will check arterial Dopplers to make sure that there is no problems with blood flow.  If normal, likely musculoskeletal or neurologic.    Shared Decision Making/Informed Consent      Medication Adjustments/Labs and Tests Ordered: Current medicines are reviewed at length with the patient today.  Concerns regarding medicines are outlined above.  Orders Placed This Encounter  Procedures   EKG 12-Lead   VAS [place] LOWER EXTREMITY ARTERIAL DUPLEX   No orders of the defined types were placed in this encounter.   Patient Instructions  Medication Instructions:  Your physician recommends that you  continue on your current medications as directed. Please refer to the Current Medication list given to you today.  *If you need a refill on your cardiac medications before your next appointment, please call your pharmacy*  Lab Work: None ordered If you have labs (blood work) drawn today and your tests are completely normal, you will receive your results only by: [place] (if you have MyChart) OR A paper copy in the mail If you have any lab test that is abnormal or we need to change your treatment, we will call you to review the results.   Testing/Procedures: Your physician has requested that you have a lower extremity arterial duplex. This test is an ultrasound of the arteries in the legs. It looks at arterial blood flow in the legs. Allow one hour for Lower Arterial scans. There are no restrictions or special instructions   Follow-Up: At [facility], you and your health needs are our priority.  As part of our continuing mission to provide you with exceptional heart care, we have created designated Provider Care Teams.  These Care Teams include your primary Cardiologist (physician) and Advanced Practice Providers (APPs -  Physician Assistants and Nurse Practitioners) who all work together to provide you with the care you need, when you need it.  Your next appointment:   1 year(s)  The format for your next appointment:   In Person  Provider:   Candee Furbish, MD    Important Information About Sugar         Signed, Candee Furbish, MD  03/24/2022 11:19 AM    [place]

## 2022-03-24 NOTE — Patient Instructions (Addendum)
Medication Instructions:  Your physician recommends that you continue on your current medications as directed. Please refer to the Current Medication list given to you today.  *If you need a refill on your cardiac medications before your next appointment, please call your pharmacy*   Lab Work: None ordered If you have labs (blood work) drawn today and your tests are completely normal, you will receive your results only by: [place] (if you have MyChart) OR A paper copy in the mail If you have any lab test that is abnormal or we need to change your treatment, we will call you to review the results.   Testing/Procedures: Your physician has requested that you have a lower extremity arterial duplex. This test is an ultrasound of the arteries in the legs. It looks at arterial blood flow in the legs. Allow one hour for Lower Arterial scans. There are no restrictions or special instructions   Follow-Up: At [facility], you and your health needs are our priority.  As part of our continuing mission to provide you with exceptional heart care, we have created designated Provider Care Teams.  These Care Teams include your primary Cardiologist (physician) and Advanced Practice Providers (APPs -  Physician Assistants and Nurse Practitioners) who all work together to provide you with the care you need, when you need it.  Your next appointment:   1 year(s)  The format for your next appointment:   In Person  Provider:   Candee Furbish, MD    Important Information About Sugar

## 2022-03-25 ENCOUNTER — Ambulatory Visit (INDEPENDENT_AMBULATORY_CARE_PROVIDER_SITE_OTHER): Payer: Medicare HMO | Admitting: Family Medicine

## 2022-03-25 ENCOUNTER — Encounter: Payer: Self-pay | Admitting: Family Medicine

## 2022-03-25 VITALS — BP 121/58 | HR 60 | Temp 97.4°F | Resp 16 | Ht 64.0 in | Wt 156.0 lb

## 2022-03-25 DIAGNOSIS — E559 Vitamin D deficiency, unspecified: Secondary | ICD-10-CM | POA: Diagnosis not present

## 2022-03-25 DIAGNOSIS — E7800 Pure hypercholesterolemia, unspecified: Secondary | ICD-10-CM

## 2022-03-25 DIAGNOSIS — Z79899 Other long term (current) drug therapy: Secondary | ICD-10-CM

## 2022-03-25 DIAGNOSIS — F419 Anxiety disorder, unspecified: Secondary | ICD-10-CM | POA: Diagnosis not present

## 2022-03-25 DIAGNOSIS — I10 Essential (primary) hypertension: Secondary | ICD-10-CM | POA: Diagnosis not present

## 2022-03-25 DIAGNOSIS — R944 Abnormal results of kidney function studies: Secondary | ICD-10-CM

## 2022-03-25 LAB — CBC WITH DIFFERENTIAL/PLATELET
Basophils Absolute: 0.1 10*3/uL (ref 0.0–0.1)
Basophils Relative: 1.1 % (ref 0.0–3.0)
Eosinophils Absolute: 0.2 10*3/uL (ref 0.0–0.7)
Eosinophils Relative: 2.7 % (ref 0.0–5.0)
HCT: 38.1 % (ref 36.0–46.0)
Hemoglobin: 12.6 g/dL (ref 12.0–15.0)
Lymphocytes Relative: 21.2 % (ref 12.0–46.0)
Lymphs Abs: 1.5 10*3/uL (ref 0.7–4.0)
MCHC: 33.1 g/dL (ref 30.0–36.0)
MCV: 90.8 fl (ref 78.0–100.0)
Monocytes Absolute: 0.6 10*3/uL (ref 0.1–1.0)
Monocytes Relative: 8.2 % (ref 3.0–12.0)
Neutro Abs: 4.8 10*3/uL (ref 1.4–7.7)
Neutrophils Relative %: 66.8 % (ref 43.0–77.0)
Platelets: 233 10*3/uL (ref 150.0–400.0)
RBC: 4.2 Mil/uL (ref 3.87–5.11)
RDW: 15.7 % — ABNORMAL HIGH (ref 11.5–15.5)
WBC: 7.2 10*3/uL (ref 4.0–10.5)

## 2022-03-25 LAB — LIPID PANEL
Cholesterol: 157 mg/dL (ref 0–200)
HDL: 81.4 mg/dL (ref >39.00)
LDL Cholesterol: 65 mg/dL (ref 0–99)
NonHDL: 75.12
Total CHOL/HDL Ratio: 2
Triglycerides: 53 mg/dL (ref 0.0–149.0)
VLDL: 10.6 mg/dL (ref 0.0–40.0)

## 2022-03-25 LAB — VITAMIN D 25 HYDROXY (VIT D DEFICIENCY, FRACTURES): VITD: 44.69 ng/mL (ref 30.00–100.00)

## 2022-03-25 LAB — COMPREHENSIVE METABOLIC PANEL
ALT: 16 U/L (ref 0–35)
AST: 21 U/L (ref 0–37)
Albumin: 4.3 g/dL (ref 3.5–5.2)
Alkaline Phosphatase: 80 U/L (ref 39–117)
BUN: 22 mg/dL (ref 6–23)
CO2: 29 mEq/L (ref 19–32)
Calcium: 9.4 mg/dL (ref 8.4–10.5)
Chloride: 101 mEq/L (ref 96–112)
Creatinine, Ser: 1.05 mg/dL (ref 0.40–1.20)
GFR: 45.84 mL/min — ABNORMAL LOW (ref >60.00)
Glucose, Bld: 79 mg/dL (ref 70–99)
Potassium: 5.1 mEq/L (ref 3.5–5.1)
Sodium: 137 mEq/L (ref 135–145)
Total Bilirubin: 1 mg/dL (ref 0.2–1.2)
Total Protein: 6.6 g/dL (ref 6.0–8.3)

## 2022-03-25 LAB — TSH: TSH: 3.53 u[IU]/mL (ref 0.35–5.50)

## 2022-03-25 MED ORDER — CLORAZEPATE DIPOTASSIUM 3.75 MG PO TABS: 3.7500 mg | ORAL_TABLET | Freq: Two times a day (BID) | ORAL | 0 refills | Status: DC | PRN

## 2022-03-25 NOTE — Progress Notes (Signed)
Established Patient Office Visit  Subjective   Patient ID: [name], female    DOB: [birth year]  Age: 89 y.o. MRN: [id number]  Chief Complaint  Patient presents with   Follow-up    Anxiety     HPI  Patient here for routine follow-up. Reports she is doing well overall. No acute concerns. She recently enjoyed a girls trip to [facility] for her daughter's 70th birthday.    Mood follow-up: - Diagnosis: anxiety  - Treatment: clorazepate 3.75 mg BID PRN, she has been taking only at bedtime  - Medication side effects: none - SI/HI: none - Update: No new concerns, doing well currently. No SI/HI.   Hypertension/CAD: - Medications: amlodipine 5 mg daily, lisinopril 40 mg daily, aspirin 81 mg daily - Compliance: good - Checking BP at home: good - Denies any SOB, recurrent headaches, CP, vision changes, LE edema, dizziness, palpitations, or medication side effects. - She follows with cardiology and saw them yesterday. Arterial dopplers ordered from some BLE discoloration, otherwise no changes.   Hyperlipidemia: - medications: atorvastatin 40 mg daily - compliance: good - medication SEs: none The ASCVD Risk score (Arnett DK, et al., 2019) failed to calculate for the following reasons:   The 2019 ASCVD risk score is only valid for ages 92 to 37   Vitamin D deficiency: - Treatment: vitamin D 2000 units daily - No new concerns      ROS All review of systems negative except what is listed in the HPI     Objective:     BP (!) 121/58   Pulse 60   Temp (!) 97.4 F (36.3 C)   Resp 16   Ht '5\' 4"'$  (1.626 m)   Wt 156 lb (70.8 kg)   LMP  (LMP Unknown)   SpO2 99%   BMI 26.78 kg/m    Physical Exam Vitals reviewed.  Constitutional:      Appearance: Normal appearance.  Cardiovascular:     Rate and Rhythm: Normal rate and regular rhythm.     Pulses: Normal pulses.     Heart sounds: Murmur heard.  Pulmonary:     Effort: Pulmonary effort is normal.     Breath  sounds: Normal breath sounds.  Skin:    General: Skin is warm and dry.  Neurological:     Mental Status: She is alert and oriented to person, place, and time.  Psychiatric:        Mood and Affect: Mood normal.        Behavior: Behavior normal.        Thought Content: Thought content normal.        Judgment: Judgment normal.      Results for orders placed or performed in visit on 03/25/22  CBC with Differential/Platelet  Result Value Ref Range   WBC 7.2 4.0 - 10.5 K/uL   RBC 4.20 3.87 - 5.11 Mil/uL   Hemoglobin 12.6 12.0 - 15.0 g/dL   HCT 38.1 36.0 - 46.0 %   MCV 90.8 78.0 - 100.0 fl   MCHC 33.1 30.0 - 36.0 g/dL   RDW 15.7 (H) 11.5 - 15.5 %   Platelets 233.0 150.0 - 400.0 K/uL   Neutrophils Relative % 66.8 43.0 - 77.0 %   Lymphocytes Relative 21.2 12.0 - 46.0 %   Monocytes Relative 8.2 3.0 - 12.0 %   Eosinophils Relative 2.7 0.0 - 5.0 %   Basophils Relative 1.1 0.0 - 3.0 %   Neutro Abs 4.8 1.4 -  7.7 K/uL   Lymphs Abs 1.5 0.7 - 4.0 K/uL   Monocytes Absolute 0.6 0.1 - 1.0 K/uL   Eosinophils Absolute 0.2 0.0 - 0.7 K/uL   Basophils Absolute 0.1 0.0 - 0.1 K/uL  Comprehensive metabolic panel  Result Value Ref Range   Sodium 137 135 - 145 mEq/L   Potassium 5.1 3.5 - 5.1 mEq/L   Chloride 101 96 - 112 mEq/L   CO2 29 19 - 32 mEq/L   Glucose, Bld 79 70 - 99 mg/dL   BUN 22 6 - 23 mg/dL   Creatinine, Ser 1.05 0.40 - 1.20 mg/dL   Total Bilirubin 1.0 0.2 - 1.2 mg/dL   Alkaline Phosphatase 80 39 - 117 U/L   AST 21 0 - 37 U/L   ALT 16 0 - 35 U/L   Total Protein 6.6 6.0 - 8.3 g/dL   Albumin 4.3 3.5 - 5.2 g/dL   GFR 45.84 (L) >60.00 mL/min   Calcium 9.4 8.4 - 10.5 mg/dL  Lipid panel  Result Value Ref Range   Cholesterol 157 0 - 200 mg/dL   Triglycerides 53.0 0.0 - 149.0 mg/dL   HDL 81.40 >39.00 mg/dL   VLDL 10.6 0.0 - 40.0 mg/dL   LDL Cholesterol 65 0 - 99 mg/dL   Total CHOL/HDL Ratio 2    NonHDL 75.12   TSH  Result Value Ref Range   TSH 3.53 0.35 - 5.50 uIU/mL  Vitamin D  (25 hydroxy)  Result Value Ref Range   VITD 44.69 30.00 - 100.00 ng/mL      The ASCVD Risk score (Arnett DK, et al., 2019) failed to calculate for the following reasons:   The 2019 ASCVD risk score is only valid for ages 54 to 77    Assessment & Plan:   Problem List Items Addressed This Visit       Cardiovascular and Mediastinum   Essential hypertension (Chronic)    Blood pressure is at goal for age and co-morbidities.   Recommendations: continue current meds - BP goal <130/80 - monitor and log blood pressures at home - check around the same time each day in a relaxed setting - Limit salt to <2000 mg/day - Follow DASH eating plan (heart healthy diet) - limit alcohol to 2 standard drinks per day for men and 1 per day for women - avoid tobacco products - get at least 2 hours of regular aerobic exercise weekly Patient aware of signs/symptoms requiring further/urgent evaluation. Labs updated today. Following with cardiology        Relevant Orders   CBC with Differential/Platelet (Completed)   Comprehensive metabolic panel (Completed)   Lipid panel (Completed)   TSH (Completed)     Other   Pure hypercholesterolemia - Primary (Chronic)    -Reviewed most recent lipid panel -Medication management: continue atorvastatin 40 mg daily -Repeat CMP and lipid panel today -Diet low in saturated fat -Regular exercise - at least 30 minutes, 5 times per week       Relevant Orders   Comprehensive metabolic panel (Completed)   Lipid panel (Completed)   High risk medication use (Chronic)    Indication for controlled substance: anxiety Medication and dose: clorazepate 3.75 mg BID PRN # pills per month: 60 Last UDS date: today (03/25/22) Controlled Substance Treatment Agreement signed (Y/N): yes, today (03/25/22) Controlled Substance Treatment Agreement last reviewed with patient:  today (03/25/22) NCCSRS/PDMP reviewed this encounter:          Relevant Orders   DRUG MONITORING,  PANEL 8 WITH CONFIRMATION, URINE   Anxiety    Stable. No SI/HI Refilling clorazepate and discussed safety Contract and UDS updated today       Relevant Medications   clorazepate (TRANXENE) 3.75 MG tablet   Other Visit Diagnoses     Vitamin D deficiency       Relevant Orders   Vitamin D (25 hydroxy) (Completed)       Return in about 3 months (around 06/24/2022) for routine follow-up, anxiety refills.    Terrilyn Saver, NP

## 2022-03-25 NOTE — Assessment & Plan Note (Signed)
Stable. No SI/HI Refilling clorazepate and discussed safety Contract and UDS updated today

## 2022-03-25 NOTE — Assessment & Plan Note (Signed)
Blood pressure is at goal for age and co-morbidities.   Recommendations: continue current meds - BP goal <130/80 - monitor and log blood pressures at home - check around the same time each day in a relaxed setting - Limit salt to <2000 mg/day - Follow DASH eating plan (heart healthy diet) - limit alcohol to 2 standard drinks per day for men and 1 per day for women - avoid tobacco products - get at least 2 hours of regular aerobic exercise weekly Patient aware of signs/symptoms requiring further/urgent evaluation. Labs updated today. Following with cardiology

## 2022-03-25 NOTE — Assessment & Plan Note (Signed)
Indication for controlled substance: anxiety Medication and dose: clorazepate 3.75 mg BID PRN # pills per month: 60 Last UDS date: today (03/25/22) Controlled Substance Treatment Agreement signed (Y/N): yes, today (03/25/22) Controlled Substance Treatment Agreement last reviewed with patient:  today (03/25/22) NCCSRS/PDMP reviewed this encounter:

## 2022-03-25 NOTE — Assessment & Plan Note (Signed)
-  Reviewed most recent lipid panel -Medication management: continue atorvastatin 40 mg daily -Repeat CMP and lipid panel today -Diet low in saturated fat -Regular exercise - at least 30 minutes, 5 times per week

## 2022-03-26 NOTE — Addendum Note (Signed)
Addended by: Caleen Jobs B on: 03/26/2022 10:14 PM   Modules accepted: Orders

## 2022-03-28 LAB — DRUG MONITORING, PANEL 8 WITH CONFIRMATION, URINE
6 Acetylmorphine: NEGATIVE ng/mL (ref <10)
Alcohol Metabolites: NEGATIVE ng/mL (ref <500)
Alphahydroxyalprazolam: NEGATIVE ng/mL (ref <25)
Alphahydroxymidazolam: NEGATIVE ng/mL (ref <50)
Alphahydroxytriazolam: NEGATIVE ng/mL (ref <50)
Aminoclonazepam: NEGATIVE ng/mL (ref <25)
Amphetamines: NEGATIVE ng/mL (ref <500)
Benzodiazepines: POSITIVE ng/mL — AB (ref <100)
Buprenorphine, Urine: NEGATIVE ng/mL (ref <5)
Cocaine Metabolite: NEGATIVE ng/mL (ref <150)
Creatinine: 32.4 mg/dL (ref >=20.0)
Hydroxyethylflurazepam: NEGATIVE ng/mL (ref <50)
Lorazepam: NEGATIVE ng/mL (ref <50)
MDMA: NEGATIVE ng/mL (ref <500)
Marijuana Metabolite: NEGATIVE ng/mL (ref <20)
Nordiazepam: NEGATIVE ng/mL (ref <50)
Opiates: NEGATIVE ng/mL (ref <100)
Oxazepam: 257 ng/mL — ABNORMAL HIGH (ref <50)
Oxidant: NEGATIVE ug/mL (ref <200)
Oxycodone: NEGATIVE ng/mL (ref <100)
Temazepam: NEGATIVE ng/mL (ref <50)
pH: 5.6 (ref 4.5–9.0)

## 2022-03-28 LAB — DM TEMPLATE

## 2022-03-30 ENCOUNTER — Other Ambulatory Visit (HOSPITAL_COMMUNITY): Payer: Self-pay | Admitting: Cardiology

## 2022-03-30 DIAGNOSIS — M79605 Pain in left leg: Secondary | ICD-10-CM

## 2022-04-06 ENCOUNTER — Other Ambulatory Visit (HOSPITAL_COMMUNITY): Payer: Self-pay

## 2022-04-07 ENCOUNTER — Ambulatory Visit (HOSPITAL_COMMUNITY)
Admission: RE | Admit: 2022-04-07 | Discharge: 2022-04-07 | Disposition: A | Discharge status: Home / Self Care | Payer: Medicare HMO | Source: Ambulatory Visit | Attending: Cardiovascular Disease | Admitting: Cardiovascular Disease

## 2022-04-07 DIAGNOSIS — R29898 Other symptoms and signs involving the musculoskeletal system: Secondary | ICD-10-CM | POA: Insufficient documentation

## 2022-04-07 DIAGNOSIS — M79605 Pain in left leg: Secondary | ICD-10-CM | POA: Diagnosis present

## 2022-04-08 LAB — VAS US LOWER EXT ART SEG MULTI (SEGMENTALS & LE RAYNAUDS)
Left ABI: 1.18
Right ABI: 1.03

## 2022-04-15 ENCOUNTER — Other Ambulatory Visit (INDEPENDENT_AMBULATORY_CARE_PROVIDER_SITE_OTHER): Payer: Medicare HMO

## 2022-04-15 DIAGNOSIS — R944 Abnormal results of kidney function studies: Secondary | ICD-10-CM | POA: Diagnosis not present

## 2022-04-15 LAB — COMPREHENSIVE METABOLIC PANEL
ALT: 17 U/L (ref 0–35)
AST: 22 U/L (ref 0–37)
Albumin: 4.2 g/dL (ref 3.5–5.2)
Alkaline Phosphatase: 83 U/L (ref 39–117)
BUN: 17 mg/dL (ref 6–23)
CO2: 28 mEq/L (ref 19–32)
Calcium: 9.4 mg/dL (ref 8.4–10.5)
Chloride: 100 mEq/L (ref 96–112)
Creatinine, Ser: 0.87 mg/dL (ref 0.40–1.20)
GFR: 57.42 mL/min — ABNORMAL LOW (ref >60.00)
Glucose, Bld: 88 mg/dL (ref 70–99)
Potassium: 5.1 mEq/L (ref 3.5–5.1)
Sodium: 135 mEq/L (ref 135–145)
Total Bilirubin: 0.8 mg/dL (ref 0.2–1.2)
Total Protein: 6.4 g/dL (ref 6.0–8.3)

## 2022-05-20 ENCOUNTER — Encounter: Payer: Self-pay | Admitting: Radiology

## 2022-06-16 ENCOUNTER — Encounter: Payer: Self-pay | Admitting: Orthopaedic Surgery

## 2022-06-16 ENCOUNTER — Ambulatory Visit (INDEPENDENT_AMBULATORY_CARE_PROVIDER_SITE_OTHER): Payer: Medicare HMO | Admitting: Orthopaedic Surgery

## 2022-06-16 DIAGNOSIS — M25561 Pain in right knee: Secondary | ICD-10-CM | POA: Diagnosis not present

## 2022-06-16 DIAGNOSIS — M25562 Pain in left knee: Secondary | ICD-10-CM | POA: Diagnosis not present

## 2022-06-16 DIAGNOSIS — G8929 Other chronic pain: Secondary | ICD-10-CM

## 2022-06-16 MED ORDER — METHYLPREDNISOLONE ACETATE 40 MG/ML IJ SUSP
40.0000 mg | INTRAMUSCULAR | Status: AC | PRN
  Administered 2022-06-16: 40 mg via INTRA_ARTICULAR

## 2022-06-16 MED ORDER — LIDOCAINE HCL 1 % IJ SOLN
3.0000 mL | INTRAMUSCULAR | Status: AC | PRN
  Administered 2022-06-16: 3 mL

## 2022-06-16 NOTE — Progress Notes (Signed)
The patient is a 89 year old female well-known to me.  She has bilateral knee osteoarthritis.  It has been over 4 months since she has had her knees injected with a steroid and she is requesting steroid injections for her knees today.  She is still reluctant to try cane or walk with assistive device.  She does state that at times she feels unstable on her left leg and as if she has "bubble wrap" with not feeling things correctly when she puts weight on her left leg.  She has been worked up for blood flow issues and these were negative.  She has known arthritis in her knees.  She has been through physical therapy as well.  Examination of both knees today shows that she does have arthritic changes around both knees with patellofemoral crepitation there are some weakness but no gross instability of either knee.  She has normal range of motion of the knees and her feet and ankle on both side.  Both feet are well-perfused.  I did place a steroid injection of both knees today.  I really encouraged her to try to use a cane in her right opposite hand and this could help offload her left knee.  She knows to wait at least 3 to 4 months between steroid injections.  Follow-up is as needed.     Procedure Note  Patient: [name]             Date of Birth: 22-Apr-1926           MRN: [id number]             Visit Date: 06/16/2022  Procedures: Visit Diagnoses:  1. Chronic pain of left knee   2. Chronic pain of right knee     Large Joint Inj: R knee on 06/16/2022 3:49 PM Indications: diagnostic evaluation and pain Details: 22 G 1.5 in needle, superolateral approach  Arthrogram: No  Medications: 3 mL lidocaine 1 %; 40 mg methylPREDNISolone acetate 40 MG/ML Outcome: tolerated well, no immediate complications Procedure, treatment alternatives, risks and benefits explained, specific risks discussed. Consent was given by the patient. Immediately prior to procedure a time out was called to verify the correct  patient, procedure, equipment, support staff and site/side marked as required. Patient was prepped and draped in the usual sterile fashion.    Large Joint Inj: L knee on 06/16/2022 3:49 PM Indications: diagnostic evaluation and pain Details: 22 G 1.5 in needle, superolateral approach  Arthrogram: No  Medications: 3 mL lidocaine 1 %; 40 mg methylPREDNISolone acetate 40 MG/ML Outcome: tolerated well, no immediate complications Procedure, treatment alternatives, risks and benefits explained, specific risks discussed. Consent was given by the patient. Immediately prior to procedure a time out was called to verify the correct patient, procedure, equipment, support staff and site/side marked as required. Patient was prepped and draped in the usual sterile fashion.

## 2022-06-23 ENCOUNTER — Telehealth: Payer: Self-pay | Admitting: Family Medicine

## 2022-06-23 NOTE — Telephone Encounter (Signed)
Contacted Gaylyn Lambert [place] to schedule their annual wellness visit. Appointment made for 07/01/2022.  Verlee Rossetti; Care Guide Ambulatory Clinical Support [place] [facility] Direct Dial: [phone]

## 2022-06-28 NOTE — Progress Notes (Unsigned)
   Established Patient Office Visit  Subjective   Patient ID: [name], female    DOB: 02/06/1927  Age: 89 y.o. MRN: [id number]  No chief complaint on file.   HPI  Patient here for routine follow-up. Reports she is doing well overall. No acute concerns. ***   Mood follow-up: - Diagnosis: anxiety  - Treatment: clorazepate 3.75 mg BID PRN, she has been taking only at bedtime  - Medication side effects: none - SI/HI: none - Update: No new concerns, doing well currently. No SI/HI.   Hypertension/CAD: - Medications: amlodipine 5 mg daily, lisinopril 40 mg daily, aspirin 81 mg daily - Compliance: good - Checking BP at home: good - Denies any SOB, recurrent headaches, CP, vision changes, LE edema, dizziness, palpitations, or medication side effects. - She follows with cardiology and saw them yesterday. Arterial dopplers ordered from some BLE discoloration, otherwise no changes.   Hyperlipidemia: - medications: atorvastatin 40 mg daily - compliance: good - medication SEs: none The ASCVD Risk score (Arnett DK, et al., 2019) failed to calculate for the following reasons:   The 2019 ASCVD risk score is only valid for ages 86 to 67   Vitamin D deficiency: - Treatment: vitamin D 2000 units daily - No new concerns      ROS All review of systems negative except what is listed in the HPI     Objective:     LMP  (LMP Unknown)    Physical Exam Vitals reviewed.  Constitutional:      Appearance: Normal appearance.  Cardiovascular:     Rate and Rhythm: Normal rate and regular rhythm.     Pulses: Normal pulses.     Heart sounds: Murmur heard.  Pulmonary:     Effort: Pulmonary effort is normal.     Breath sounds: Normal breath sounds.  Skin:    General: Skin is warm and dry.  Neurological:     Mental Status: She is alert and oriented to person, place, and time.  Psychiatric:        Mood and Affect: Mood normal.        Behavior: Behavior normal.        Thought  Content: Thought content normal.        Judgment: Judgment normal.      No results found for any visits on 06/29/22.     The ASCVD Risk score (Arnett DK, et al., 2019) failed to calculate for the following reasons:   The 2019 ASCVD risk score is only valid for ages 19 to 19    Assessment & Plan:   Problem List Items Addressed This Visit   None  No follow-ups on file.    Clayborne Dana, NP

## 2022-06-29 ENCOUNTER — Ambulatory Visit (INDEPENDENT_AMBULATORY_CARE_PROVIDER_SITE_OTHER): Payer: Medicare HMO | Admitting: Family Medicine

## 2022-06-29 ENCOUNTER — Encounter: Payer: Self-pay | Admitting: Family Medicine

## 2022-06-29 VITALS — BP 125/48 | HR 64 | Ht 64.0 in | Wt 154.0 lb

## 2022-06-29 DIAGNOSIS — F419 Anxiety disorder, unspecified: Secondary | ICD-10-CM | POA: Diagnosis not present

## 2022-06-29 DIAGNOSIS — E7800 Pure hypercholesterolemia, unspecified: Secondary | ICD-10-CM | POA: Diagnosis not present

## 2022-06-29 DIAGNOSIS — R2689 Other abnormalities of gait and mobility: Secondary | ICD-10-CM

## 2022-06-29 DIAGNOSIS — I10 Essential (primary) hypertension: Secondary | ICD-10-CM

## 2022-06-29 LAB — COMPREHENSIVE METABOLIC PANEL
ALT: 14 U/L (ref 0–35)
AST: 17 U/L (ref 0–37)
Albumin: 3.9 g/dL (ref 3.5–5.2)
Alkaline Phosphatase: 81 U/L (ref 39–117)
BUN: 21 mg/dL (ref 6–23)
CO2: 30 mEq/L (ref 19–32)
Calcium: 9.1 mg/dL (ref 8.4–10.5)
Chloride: 100 mEq/L (ref 96–112)
Creatinine, Ser: 0.87 mg/dL (ref 0.40–1.20)
GFR: 57.33 mL/min — ABNORMAL LOW (ref >60.00)
Glucose, Bld: 80 mg/dL (ref 70–99)
Potassium: 5 mEq/L (ref 3.5–5.1)
Sodium: 135 mEq/L (ref 135–145)
Total Bilirubin: 0.8 mg/dL (ref 0.2–1.2)
Total Protein: 6.1 g/dL (ref 6.0–8.3)

## 2022-06-29 MED ORDER — LISINOPRIL 40 MG PO TABS: 40.0000 mg | ORAL_TABLET | Freq: Every day | ORAL | 1 refills | Status: DC

## 2022-06-29 MED ORDER — AMLODIPINE BESYLATE 5 MG PO TABS: 5.0000 mg | ORAL_TABLET | Freq: Every day | ORAL | 1 refills | Status: DC

## 2022-06-29 MED ORDER — PANTOPRAZOLE SODIUM 40 MG PO TBEC: 40.0000 mg | DELAYED_RELEASE_TABLET | Freq: Every day | ORAL | 3 refills | Status: DC

## 2022-06-29 MED ORDER — ATORVASTATIN CALCIUM 40 MG PO TABS: 40.0000 mg | ORAL_TABLET | Freq: Every day | ORAL | 1 refills | Status: DC

## 2022-06-29 NOTE — Assessment & Plan Note (Signed)
-  Reviewed most recent lipid panel; deferred recheck today  -Medication management: continue atorvastatin 40 mg daily -Diet low in saturated fat -Regular exercise - at least 30 minutes, 5 times per week

## 2022-06-29 NOTE — Assessment & Plan Note (Signed)
Doing well with rare clorazepate use. Discussed safety.  No SI/HI

## 2022-06-29 NOTE — Assessment & Plan Note (Signed)
Blood pressure is at goal for age and co-morbidities.   Recommendations: continue current meds - BP goal <130/80 - monitor and log blood pressures at home - check around the same time each day in a relaxed setting - Limit salt to <2000 mg/day - Follow DASH eating plan (heart healthy diet) - limit alcohol to 2 standard drinks per day for men and 1 per day for women - avoid tobacco products - get at least 2 hours of regular aerobic exercise weekly Patient aware of signs/symptoms requiring further/urgent evaluation. CMP updated today. Following with cardiology

## 2022-06-29 NOTE — Patient Instructions (Signed)
Good to see you today! Rechecking your kidney function today and sending in medication refills.  Referral sent to physical therapy

## 2022-07-01 ENCOUNTER — Ambulatory Visit (INDEPENDENT_AMBULATORY_CARE_PROVIDER_SITE_OTHER): Payer: Medicare HMO | Admitting: *Deleted

## 2022-07-01 DIAGNOSIS — Z0000 Encounter for general adult medical examination without abnormal findings: Secondary | ICD-10-CM | POA: Diagnosis not present

## 2022-07-01 NOTE — Patient Instructions (Signed)
[name] , Thank you for taking time to come for your Medicare Wellness Visit. I appreciate your ongoing commitment to your health goals. Please review the following plan we discussed and let me know if I can assist you in the future.   These are the goals we discussed:  Goals   None     This is a list of the screening recommended for you and due dates:  Health Maintenance  Topic Date Due   Zoster (Shingles) Vaccine (2 of 2) 02/09/2019   DEXA scan (bone density measurement)  06/29/2023*   COVID-19 Vaccine (4 - 2023-24 season) 06/29/2023*   Flu Shot  10/14/2022   Medicare Annual Wellness Visit  07/01/2023   DTaP/Tdap/Td vaccine (3 - Td or Tdap) 03/19/2027   Pneumonia Vaccine  Completed   HPV Vaccine  Aged Out  *Topic was postponed. The date shown is not the original due date.     Next appointment: Follow up in one year for your annual wellness visit.   Preventive Care 87 Years and Older, Female Preventive care refers to lifestyle choices and visits with your health care provider that can promote health and wellness. What does preventive care include? A yearly physical exam. This is also called an annual well check. Dental exams once or twice a year. Routine eye exams. Ask your health care provider how often you should have your eyes checked. Personal lifestyle choices, including: Daily care of your teeth and gums. Regular physical activity. Eating a healthy diet. Avoiding tobacco and drug use. Limiting alcohol use. Practicing safe sex. Taking low-dose aspirin every day. Taking vitamin and mineral supplements as recommended by your health care provider. What happens during an annual well check? The services and screenings done by your health care provider during your annual well check will depend on your age, overall health, lifestyle risk factors, and family history of disease. Counseling  Your health care provider may ask you questions about your: Alcohol use. Tobacco  use. Drug use. Emotional well-being. Home and relationship well-being. Sexual activity. Eating habits. History of falls. Memory and ability to understand (cognition). Work and work Astronomer. Reproductive health. Screening  You may have the following tests or measurements: Height, weight, and BMI. Blood pressure. Lipid and cholesterol levels. These may be checked every 5 years, or more frequently if you are over 69 years old. Skin check. Lung cancer screening. You may have this screening every year starting at age 37 if you have a 30-pack-year history of smoking and currently smoke or have quit within the past 15 years. Fecal occult blood test (FOBT) of the stool. You may have this test every year starting at age 76. Flexible sigmoidoscopy or colonoscopy. You may have a sigmoidoscopy every 5 years or a colonoscopy every 10 years starting at age 63. Hepatitis C blood test. Hepatitis B blood test. Sexually transmitted disease (STD) testing. Diabetes screening. This is done by checking your blood sugar (glucose) after you have not eaten for a while (fasting). You may have this done every 1-3 years. Bone density scan. This is done to screen for osteoporosis. You may have this done starting at age 27. Mammogram. This may be done every 1-2 years. Talk to your health care provider about how often you should have regular mammograms. Talk with your health care provider about your test results, treatment options, and if necessary, the need for more tests. Vaccines  Your health care provider may recommend certain vaccines, such as: Influenza vaccine. This is recommended  every year. Tetanus, diphtheria, and acellular pertussis (Tdap, Td) vaccine. You may need a Td booster every 10 years. Zoster vaccine. You may need this after age 46. Pneumococcal 13-valent conjugate (PCV13) vaccine. One dose is recommended after age 16. Pneumococcal polysaccharide (PPSV23) vaccine. One dose is recommended  after age 29. Talk to your health care provider about which screenings and vaccines you need and how often you need them. This information is not intended to replace advice given to you by your health care provider. Make sure you discuss any questions you have with your health care provider. Document Released: 03/28/2015 Document Revised: 11/19/2015 Document Reviewed: 12/31/2014 Elsevier Interactive Patient Education  2017 [employer].  Fall Prevention in the Home Falls can cause injuries. They can happen to people of all ages. There are many things you can do to make your home safe and to help prevent falls. What can I do on the outside of my home? Regularly fix the edges of walkways and driveways and fix any cracks. Remove anything that might make you trip as you walk through a door, such as a raised step or threshold. Trim any bushes or trees on the path to your home. Use bright outdoor lighting. Clear any walking paths of anything that might make someone trip, such as rocks or tools. Regularly check to see if handrails are loose or broken. Make sure that both sides of any steps have handrails. Any raised decks and porches should have guardrails on the edges. Have any leaves, snow, or ice cleared regularly. Use sand or salt on walking paths during winter. Clean up any spills in your garage right away. This includes oil or grease spills. What can I do in the bathroom? Use night lights. Install grab bars by the toilet and in the tub and shower. Do not use towel bars as grab bars. Use non-skid mats or decals in the tub or shower. If you need to sit down in the shower, use a plastic, non-slip stool. Keep the floor dry. Clean up any water that spills on the floor as soon as it happens. Remove soap buildup in the tub or shower regularly. Attach bath mats securely with double-sided non-slip rug tape. Do not have throw rugs and other things on the floor that can make you trip. What can I do  in the bedroom? Use night lights. Make sure that you have a light by your bed that is easy to reach. Do not use any sheets or blankets that are too big for your bed. They should not hang down onto the floor. Have a firm chair that has side arms. You can use this for support while you get dressed. Do not have throw rugs and other things on the floor that can make you trip. What can I do in the kitchen? Clean up any spills right away. Avoid walking on wet floors. Keep items that you use a lot in easy-to-reach places. If you need to reach something above you, use a strong step stool that has a grab bar. Keep electrical cords out of the way. Do not use floor polish or wax that makes floors slippery. If you must use wax, use non-skid floor wax. Do not have throw rugs and other things on the floor that can make you trip. What can I do with my stairs? Do not leave any items on the stairs. Make sure that there are handrails on both sides of the stairs and use them. Fix handrails that are broken  or loose. Make sure that handrails are as long as the stairways. Check any carpeting to make sure that it is firmly attached to the stairs. Fix any carpet that is loose or worn. Avoid having throw rugs at the top or bottom of the stairs. If you do have throw rugs, attach them to the floor with carpet tape. Make sure that you have a light switch at the top of the stairs and the bottom of the stairs. If you do not have them, ask someone to add them for you. What else can I do to help prevent falls? Wear shoes that: Do not have high heels. Have rubber bottoms. Are comfortable and fit you well. Are closed at the toe. Do not wear sandals. If you use a stepladder: Make sure that it is fully opened. Do not climb a closed stepladder. Make sure that both sides of the stepladder are locked into place. Ask someone to hold it for you, if possible. Clearly mark and make sure that you can see: Any grab bars or  handrails. First and last steps. Where the edge of each step is. Use tools that help you move around (mobility aids) if they are needed. These include: Canes. Walkers. Scooters. Crutches. Turn on the lights when you go into a dark area. Replace any light bulbs as soon as they burn out. Set up your furniture so you have a clear path. Avoid moving your furniture around. If any of your floors are uneven, fix them. If there are any pets around you, be aware of where they are. Review your medicines with your doctor. Some medicines can make you feel dizzy. This can increase your chance of falling. Ask your doctor what other things that you can do to help prevent falls. This information is not intended to replace advice given to you by your health care provider. Make sure you discuss any questions you have with your health care provider. Document Released: 12/26/2008 Document Revised: 08/07/2015 Document Reviewed: 04/05/2014 Elsevier Interactive Patient Education  2017 [employer].

## 2022-07-01 NOTE — Progress Notes (Signed)
Subjective:   [name] is a 89 y.o. female who presents for Medicare Annual (Subsequent) preventive examination.  I connected with  [name] on 07/01/22 by a audio enabled telemedicine application and verified that I am speaking with the correct person using two identifiers.  Patient Location: Home  Provider Location: Office/Clinic  I discussed the limitations of evaluation and management by telemedicine. The patient expressed understanding and agreed to proceed.   Review of Systems     Cardiac Risk Factors include: advanced age (>33men, >24 women);dyslipidemia;hypertension     Objective:    There were no vitals filed for this visit. There is no height or weight on file to calculate BMI.     07/01/2022    3:11 PM 04/06/2021   10:50 AM 07/12/2020   12:20 PM 02/17/2017    2:29 PM 02/15/2017   11:33 AM 02/09/2017   11:45 AM 02/01/2017    1:45 PM  Advanced Directives  Does Patient Have a Medical Advance Directive? Yes No Yes No No No No  Type of Estate agent of [place];Living will  Healthcare Power of [place];Living will      Copy of Healthcare Power of Attorney in Chart? No - copy requested  No - copy requested      Would patient like information on creating a medical advance directive?  No - Patient declined         Current Medications (verified) Outpatient Encounter Medications as of 07/01/2022  Medication Sig   amLODipine (NORVASC) 5 MG tablet Take 1 tablet (5 mg total) by mouth daily.   Ascorbic Acid (VITAMIN C) 1000 MG tablet Take 1,000 mg by mouth daily.   aspirin 81 MG tablet Take 81 mg by mouth daily.     atorvastatin (LIPITOR) 40 MG tablet Take 1 tablet (40 mg total) by mouth daily.   cetirizine (ZYRTEC) 10 MG tablet Take 10 mg by mouth daily.     Cholecalciferol (VITAMIN D) 2000 units CAPS Take 2,000 Units by mouth daily.   clorazepate (TRANXENE) 3.75 MG tablet Take 1 tablet (3.75 mg total) by mouth 2 (two) times daily as needed  for anxiety.   Coenzyme Q10 (CO Q 10 PO) Take 1 tablet by mouth every morning.   diclofenac Sodium (VOLTAREN) 1 % GEL Apply 4 g topically 4 (four) times daily. (Patient taking differently: Apply 4 g topically as needed (pain).)   fluticasone (FLONASE) 50 MCG/ACT nasal spray Place 2 sprays into both nostrils daily. (Patient taking differently: Place 2 sprays into both nostrils as needed for allergies.)   Glucosamine-Chondroit-Vit C-Mn (GLUCOSAMINE CHONDR 1500 COMPLX PO) Take by mouth daily at 6 (six) AM.   lisinopril (ZESTRIL) 40 MG tablet Take 1 tablet (40 mg total) by mouth daily.   nitroGLYCERIN (NITROSTAT) 0.4 MG SL tablet Place 1 tablet (0.4 mg total) under the tongue every 5 (five) minutes as needed for chest pain. (Patient not taking: Reported on 06/29/2022)   pantoprazole (PROTONIX) 40 MG tablet Take 1 tablet (40 mg total) by mouth daily.   No facility-administered encounter medications on file as of 07/01/2022.    Allergies (verified) Patient has no known allergies.   History: Past Medical History:  Diagnosis Date   Aortic valve sclerosis    CAD (coronary artery disease)    S/p DES to the proximal RCA, POBA to the distal RCA 8/08 //Myoview 2009 low risk // Myoview 9/22: EF 57, no ischemia or infarction, apical thinning artifact, low risk study  DUB (dysfunctional uterine bleeding)    Elevated cholesterol    Atorvastatin   GERD (gastroesophageal reflux disease)    History of echocardiogram    EF 60-65, GRII DD, GLS -18.4, normal RVSF, moderate MAC, moderate AV calcification, trivial AI   Hypertension    Plantar fasciitis    Shingles    Past Surgical History:  Procedure Laterality Date   APPENDECTOMY  2011   CARDIAC STINT IMPLANT  2008   CARPAL TUNNEL RELEASE     EYE SURGERY     INGUINAL HERNIA REPAIR     OOPHORECTOMY  2001   LAVH,BSO   ULNA NERVE RELEASE  2009   VAGINAL HYSTERECTOMY  2001   LAVH, BSO   Family History  Problem Relation Age of Onset   Heart attack  Mother    Hypertension Mother    Heart attack Father    CAD Father    Heart disease Brother    Hypertension Brother    Diabetes Brother    COPD Brother    Asthma Brother    Breast cancer Neg Hx    Social History   Socioeconomic History   Marital status: Widowed    Spouse name: Not on file   Number of children: Not on file   Years of education: Not on file   Highest education level: 12th grade  Occupational History   Not on file  Tobacco Use   Smoking status: Former   Smokeless tobacco: Never  Vaping Use   Vaping Use: Never used  Substance and Sexual Activity   Alcohol use: No    Alcohol/week: 0.0 standard drinks of alcohol   Drug use: No   Sexual activity: Never    Birth control/protection: Post-menopausal, Surgical    Comment: INTERCOURSE AGE 46, SEXUAL PARTNERS LEWSS THAN 5  Other Topics Concern   Not on file  Social History Narrative   Not on file   Social Determinants of Health   Financial Resource Strain: Low Risk  (06/24/2022)   Overall Financial Resource Strain (CARDIA)    Difficulty of Paying Living Expenses: Not hard at all  Food Insecurity: No Food Insecurity (06/24/2022)   Hunger Vital Sign    Worried About Running Out of Food in the Last Year: Never true    Ran Out of Food in the Last Year: Never true  Transportation Needs: No Transportation Needs (06/24/2022)   PRAPARE - Administrator, Civil Service (Medical): No    Lack of Transportation (Non-Medical): No  Physical Activity: Insufficiently Active (06/24/2022)   Exercise Vital Sign    Days of Exercise per Week: 6 days    Minutes of Exercise per Session: 10 min  Stress: Stress Concern Present (06/24/2022)   [employer] of Occupational Health - Occupational Stress Questionnaire    Feeling of Stress : To some extent  Social Connections: Moderately Integrated (06/24/2022)   Social Connection and Isolation Panel [NHANES]    Frequency of Communication with Friends and Family: More than  three times a week    Frequency of Social Gatherings with Friends and Family: Twice a week    Attends Religious Services: More than 4 times per year    Active Member of [employer] or Organizations: Yes    Attends Banker Meetings: More than 4 times per year    Marital Status: Widowed    Tobacco Counseling Counseling given: Not Answered   Clinical Intake:  Pre-visit preparation completed: Yes  Pain : No/denies pain  BMI -  recorded: 26.43 Nutritional Status: BMI 25 -29 Overweight Nutritional Risks: None Diabetes: No  How often do you need to have someone help you when you read instructions, pamphlets, or other written materials from your doctor or pharmacy?: 1 - Never   Activities of Daily Living    07/01/2022    3:15 PM  In your present state of health, do you have any difficulty performing the following activities:  Hearing? 1  Comment wears hearing aids  Vision? 0  Difficulty concentrating or making decisions? 0  Walking or climbing stairs? 1  Dressing or bathing? 0  Doing errands, shopping? 0  Preparing Food and eating ? N  Using the Toilet? N  In the past six months, have you accidently leaked urine? N  Do you have problems with loss of bowel control? N  Managing your Medications? N  Managing your Finances? N  Housekeeping or managing your Housekeeping? N    Patient Care Team: Clayborne Dana, NP as PCP - General (Family Medicine) Jake Bathe, MD as PCP - Cardiology (Cardiology)  Indicate any recent Medical Services you may have received from other than [facility] providers in the past year (date may be approximate).     Assessment:   This is a routine wellness examination for [facility]  Hearing/Vision screen No results found.  Dietary issues and exercise activities discussed: Current Exercise Habits: Home exercise routine, Type of exercise: walking, Time (Minutes): 15, Frequency (Times/Week): 7, Weekly Exercise (Minutes/Week): 105, Intensity: Mild,  Exercise limited by: None identified   Goals Addressed   None    Depression Screen    07/01/2022    3:14 PM 05/14/2021    6:38 PM  PHQ 2/9 Scores  PHQ - 2 Score 0 0    Fall Risk    07/01/2022    3:12 PM  Fall Risk   Falls in the past year? 0  Number falls in past yr: 0  Injury with Fall? 0  Risk for fall due to : No Fall Risks  Follow up Falls evaluation completed    FALL RISK PREVENTION PERTAINING TO THE HOME:  Any stairs in or around the home? No  Home free of loose throw rugs in walkways, pet beds, electrical cords, etc? Yes  Adequate lighting in your home to reduce risk of falls? Yes   ASSISTIVE DEVICES UTILIZED TO PREVENT FALLS:  Life alert? No  Use of a cane, walker or w/c? No  Grab bars in the bathroom? Yes  Shower chair or bench in shower? No  Elevated toilet seat or a handicapped toilet?  Comfort height  TIMED UP AND GO:  Was the test performed?  No, audio visit .    Cognitive Function:        07/01/2022    3:19 PM  6CIT Screen  What Year? 0 points  What month? 0 points  What time? 0 points  Count back from 20 0 points  Months in reverse 0 points  Repeat phrase 2 points  Total Score 2 points    Immunizations Immunization History  Administered Date(s) Administered   Fluad Quad(high Dose 65+) 01/14/2021, 11/23/2021   Influenza Split 10/14/2010, 01/11/2011, 01/13/2012, 02/01/2013, 01/03/2014   Influenza, High Dose Seasonal PF 01/11/2019   Influenza,inj,Quad PF,6+ Mos 01/03/2014, 12/04/2014   PFIZER(Purple Top)SARS-COV-2 Vaccination 04/04/2019, 04/24/2019, 12/12/2019   Pneumococcal Conjugate-13 04/04/2013   Pneumococcal Polysaccharide-23 09/01/2009, 01/13/2012   Td 03/18/2017   Tdap 09/01/2009   Zoster Recombinat (Shingrix) 12/15/2018   Zoster, Live 09/01/2009  TDAP status: Up to date  Flu Vaccine status: Up to date  Pneumococcal vaccine status: Up to date  Covid-19 vaccine status: Information provided on how to obtain vaccines.    Qualifies for Shingles Vaccine? Yes   Zostavax completed Yes   Shingrix Completed?: No.    Education has been provided regarding the importance of this vaccine. Patient has been advised to call insurance company to determine out of pocket expense if they have not yet received this vaccine. Advised may also receive vaccine at local pharmacy or Health Dept. Verbalized acceptance and understanding.  Screening Tests Health Maintenance  Topic Date Due   Zoster Vaccines- Shingrix (2 of 2) 02/09/2019   Medicare Annual Wellness (AWV)  04/17/2021   DEXA SCAN  06/29/2023 (Originally 10/27/1993)   COVID-19 Vaccine (4 - 2023-24 season) 06/29/2023 (Originally 11/13/2021)   INFLUENZA VACCINE  10/14/2022   DTaP/Tdap/Td (3 - Td or Tdap) 03/19/2027   Pneumonia Vaccine 74+ Years old  Completed   HPV VACCINES  Aged Out    Health Maintenance  Health Maintenance Due  Topic Date Due   Zoster Vaccines- Shingrix (2 of 2) 02/09/2019   Medicare Annual Wellness (AWV)  04/17/2021    Colorectal cancer screening: No longer required.   Mammogram status: Completed 01/12/22. Repeat every year  Bone Density status: Pt declined.  Lung Cancer Screening: (Low Dose CT Chest recommended if Age 72-80 years, 30 pack-year currently smoking OR have quit w/in 15years.) does not qualify.   Additional Screening:  Hepatitis C Screening: does not qualify  Vision Screening: Recommended annual ophthalmology exams for early detection of glaucoma and other disorders of the eye. Is the patient up to date with their annual eye exam?  Yes  Who is the provider or what is the name of the office in which the patient attends annual eye exams? Dr. Marikay [facility] If pt is not established with a provider, would they like to be referred to a provider to establish care? No .   Dental Screening: Recommended annual dental exams for proper oral hygiene  Community Resource Referral / Chronic Care Management: CRR required  this visit?  No   CCM required this visit?  No      Plan:     I have personally reviewed and noted the following in the patient's chart:   Medical and social history Use of alcohol, tobacco or illicit drugs  Current medications and supplements including opioid prescriptions. Patient is not currently taking opioid prescriptions. Functional ability and status Nutritional status Physical activity Advanced directives List of other physicians Hospitalizations, surgeries, and ER visits in previous 12 months Vitals Screenings to include cognitive, depression, and falls Referrals and appointments  In addition, I have reviewed and discussed with patient certain preventive protocols, quality metrics, and best practice recommendations. A written personalized care plan for preventive services as well as general preventive health recommendations were provided to patient.   Due to this being a telephonic visit, the after visit summary with patients personalized plan was offered to patient via mail or my-chart. Patient would like to access on my-chart.  Donne Anon, [place]   07/01/2022   Nurse Notes: None

## 2022-07-19 NOTE — Therapy (Signed)
OUTPATIENT PHYSICAL THERAPY LOWER EXTREMITY EVALUATION   Patient Name: [name] MRN: [id number] DOB:12-25-1926, 89 y.o., female Today's Date: 07/20/2022  END OF SESSION:  PT End of Session - 07/20/22 0933     Visit Number 1    Date for PT Re-Evaluation 09/14/22    Progress Note Due on Visit 10    PT Start Time 0933    PT Stop Time 1020    PT Time Calculation (min) 47 min    Activity Tolerance Patient tolerated treatment well    Behavior During Therapy [facility] for tasks assessed/performed             Past Medical History:  Diagnosis Date   Aortic valve sclerosis    CAD (coronary artery disease)    S/p DES to the proximal RCA, POBA to the distal RCA 8/08 //Myoview 2009 low risk // Myoview 9/22: EF 57, no ischemia or infarction, apical thinning artifact, low risk study   DUB (dysfunctional uterine bleeding)    Elevated cholesterol    Atorvastatin   GERD (gastroesophageal reflux disease)    History of echocardiogram    EF 60-65, GRII DD, GLS -18.4, normal RVSF, moderate MAC, moderate AV calcification, trivial AI   Hypertension    Plantar fasciitis    Shingles    Past Surgical History:  Procedure Laterality Date   APPENDECTOMY  2011   CARDIAC STINT IMPLANT  2008   CARPAL TUNNEL RELEASE     EYE SURGERY     INGUINAL HERNIA REPAIR     OOPHORECTOMY  2001   LAVH,BSO   ULNA NERVE RELEASE  2009   VAGINAL HYSTERECTOMY  2001   LAVH, BSO   Patient Active Problem List   Diagnosis Date Noted   High risk medication use 03/25/2022   Bilateral sensorineural hearing loss 12/29/2021   Anxiety 11/24/2021   Aortic valve sclerosis 02/24/2021   Varicose veins of both lower extremities 02/24/2021   Left knee pain 04/28/2015   Coronary artery disease involving native coronary artery of native heart without angina pectoris 11/22/2013   Essential hypertension 11/22/2013   Pure hypercholesterolemia 11/22/2013   Inversion, nipple 09/05/2013   Shingles    Elevated cholesterol     Cancer (HCC)    Right knee pain 05/10/2011    PCP: Clayborne Dana, NP   REFERRING PROVIDER: Clayborne Dana, NP   REFERRING DIAG: R26.89 (ICD-10-CM) - Balance problems   THERAPY DIAG:  Unsteadiness on feet  Muscle weakness (generalized)  Other abnormalities of gait and mobility  Rationale for Evaluation and Treatment: Rehabilitation  ONSET DATE: one year or more  SUBJECTIVE:   SUBJECTIVE STATEMENT: My left leg feels like I'm on a sponge.  I have a fear of falling.  PERTINENT HISTORY: Anxiety, CAD, HTN, B knee pain PAIN:  Are you having pain? No  PRECAUTIONS: None  WEIGHT BEARING RESTRICTIONS: No  FALLS:  Has patient fallen in last 6 months? No  LIVING ENVIRONMENT: Lives with: lives alone Lives in: House/apartment Stairs:  No, but a steep driveway (length only car and a half) Has following equipment at home: Single point cane, shower chair, and were her late husbands  OCCUPATION: retired  PLOF: Independent  PATIENT GOALS: walk and feel comfortable without fear  NEXT MD VISIT: not scheduled yet  OBJECTIVE:   DIAGNOSTIC FINDINGS:   PATIENT SURVEYS:  ABC scale  980 / 1600 = 61.3 %  COGNITION: Overall cognitive status: Within functional limits for tasks assessed    MUSCLE  LENGTH: Mild bil HS and piriformis R>L  POSTURE: rounded shoulders and forward head  PALPATION: NT  LOWER EXTREMITY MMT:   MMT Right eval Left eval  Hip flexion 4+ 4+  Hip extension 4- 4-  Hip abduction 4- 4-  Hip adduction 4 4  Knee flexion 4+ 4+  Knee extension 5 5  Ankle dorsiflexion 5 5   (Blank rows = not tested)  LOWER EXTREMITY ROM: WFL   FUNCTIONAL TESTS:  5 times sit to stand: 21.4 sec  Berg Balance Scale: 42/56 Significant risk for falls Dynamic Gait Index: TBD MCTSIB: Condition 1: Avg of 3 trials: 30 sec, Condition 2: Avg of 3 trials: 30 sec, Condition 3: Avg of 3 trials: 30 sec, Condition 4: Avg of 3 trials: 30 sec, and Total Score: 120/120 Stairs:  step to gait; able to do reciprocal but must pull up with B UE with L leg.   GAIT: Distance walked: 180 Assistive device utilized: Single point cane and None Level of assistance: Complete Independence Comments: without AD: decreased step length R, Wide BOS: with cane: improved gait pattern and patient confidence   TODAY'S TREATMENT:                                                                                                                              DATE:   07/20/22 See pt ed and HEP  PATIENT EDUCATION:  Education details: PT eval findings, anticipated POC, need for further assessment of low back, initial HEP, and gait safety with SPC  Person educated: Patient Education method: Explanation, Demonstration, and Handouts Education comprehension: verbalized understanding and returned demonstration  HOME EXERCISE PROGRAM: Access Code: [email or web address] URL: [email or web address] Date: 07/20/2022 Prepared by: Raynelle Fanning  Exercises - Supine Bridge  - 2 x daily - 7 x weekly - 1-2 sets - 10 reps - Sit to Stand with Armchair  - 2 x daily - 7 x weekly - 1 sets - 5-10 reps - Standing Hip Abduction with Counter Support  - 1 x daily - 7 x weekly - 2 sets - 10 reps  ASSESSMENT:  CLINICAL IMPRESSION: [name] is a 89 y.o. female who was seen today for physical therapy evaluation and treatment for unsteadiness. She has not had any falls but reports her left leg feels like she is walking on a sponge. She has a h/o LBP, but denies pain today. She also reports pain in her bil knees R>L. She has weakness in B LE L> R and her functional weakness with balance testing and stairs is more pronounced than MMT. Her ABC scale is 61.3 %. Her confidence improved when using a SPC for gait in the clinic and patient plans to acquire one. Her BERG score of 42/56 indicates she is at significant risk for falls. She will benefit from skilled PT to address these deificits.   OBJECTIVE IMPAIRMENTS: Abnormal  gait, decreased balance, decreased strength, impaired flexibility, postural dysfunction, and pain.   ACTIVITY  LIMITATIONS: stairs and locomotion level  PARTICIPATION LIMITATIONS: community activity  PERSONAL FACTORS: 3+ comorbidities: anxiety, LBP, B knee pain  are also affecting patient's functional outcome.   REHAB POTENTIAL: Excellent  CLINICAL DECISION MAKING: Evolving/moderate complexity  EVALUATION COMPLEXITY: Moderate   GOALS: Goals reviewed with patient? Yes  SHORT TERM GOALS: Target date: 08/03/2022   Patient will be independent with initial HEP. Baseline:  Goal status: INITIAL  2.   Patient will be educated on strategies to decrease risk of falls.  Baseline:  Goal status: INITIAL   LONG TERM GOALS: Target date: 09/14/2022  Patient will be independent with advanced/ongoing HEP to improve outcomes and carryover.  Baseline:  Goal status: INITIAL  2.  Patient will be able to ambulate 600' with LRAD with good safety to access community.  Baseline:  Goal status: INITIAL  3.  Patient will be able to step up/down curb safely with LRAD for safety with community ambulation.  Baseline:  Goal status: INITIAL   4.  Patient will demonstrate improved functional LE strength as demonstrated by 5 sec decrease in 5x sit to stand time. Baseline: 21.4 sec Goal status: INITIAL  5.  Patient will demonstrate at least 19/24 on DGI to improve gait stability and reduce risk for falls. Baseline: TBD Goal status: INITIAL  6.  Patient will score 50/56 on Berg Balance test to demonstrate lower risk of falls. (MCID= 8 points) .  Baseline: 42/56 Goal status: INITIAL  7.  Patient will report >= 999 on ABC scale to demonstrate improved functional ability. Baseline:  980 / 1600 = 61.3 % Goal status: INITIAL  8.  Patient will demonstrate improved B hip ABD and ext strength to 4+/5 or better to improve function. Baseline:  Goal status: INITIAL    PLAN:  PT FREQUENCY: 2x/week  PT  DURATION: 8 weeks  PLANNED INTERVENTIONS: Therapeutic exercises, Therapeutic activity, Neuromuscular re-education, Balance training, Gait training, Patient/Family education, Self Care, Joint mobilization, Stair training, Dry Needling, Electrical stimulation, Spinal mobilization, Cryotherapy, Moist heat, and Manual therapy  PLAN FOR NEXT SESSION: complete DGI, assess LB, work on gait with SPC, step ups/downs and standing hip ABD/ext strengthening;    Ivy Puryear, PT 07/20/2022, 10:55 AM

## 2022-07-20 ENCOUNTER — Encounter: Payer: Self-pay | Admitting: Physical Therapy

## 2022-07-20 ENCOUNTER — Ambulatory Visit: Payer: Medicare HMO | Attending: Family Medicine | Admitting: Physical Therapy

## 2022-07-20 ENCOUNTER — Other Ambulatory Visit: Payer: Self-pay

## 2022-07-20 DIAGNOSIS — R2689 Other abnormalities of gait and mobility: Secondary | ICD-10-CM | POA: Insufficient documentation

## 2022-07-20 DIAGNOSIS — M6281 Muscle weakness (generalized): Secondary | ICD-10-CM | POA: Diagnosis present

## 2022-07-20 DIAGNOSIS — R2681 Unsteadiness on feet: Secondary | ICD-10-CM | POA: Diagnosis present

## 2022-07-27 NOTE — Therapy (Signed)
OUTPATIENT PHYSICAL THERAPY LOWER EXTREMITY TREATMENT   Patient Name: Shanekwa Molt MRN: [id number] DOB:1926-09-28, 89 y.o., female Today's Date: 07/20/2022  END OF SESSION:  PT End of Session - 07/20/22 0933     Visit Number 1    Date for PT Re-Evaluation 09/14/22    Progress Note Due on Visit 10    PT Start Time 0933    PT Stop Time 1020    PT Time Calculation (min) 47 min    Activity Tolerance Patient tolerated treatment well    Behavior During Therapy [facility] for tasks assessed/performed             Past Medical History:  Diagnosis Date   Aortic valve sclerosis    CAD (coronary artery disease)    S/p DES to the proximal RCA, POBA to the distal RCA 8/08 //Myoview 2009 low risk // Myoview 9/22: EF 57, no ischemia or infarction, apical thinning artifact, low risk study   DUB (dysfunctional uterine bleeding)    Elevated cholesterol    Atorvastatin   GERD (gastroesophageal reflux disease)    History of echocardiogram    EF 60-65, GRII DD, GLS -18.4, normal RVSF, moderate MAC, moderate AV calcification, trivial AI   Hypertension    Plantar fasciitis    Shingles    Past Surgical History:  Procedure Laterality Date   APPENDECTOMY  2011   CARDIAC STINT IMPLANT  2008   CARPAL TUNNEL RELEASE     EYE SURGERY     INGUINAL HERNIA REPAIR     OOPHORECTOMY  2001   LAVH,BSO   ULNA NERVE RELEASE  2009   VAGINAL HYSTERECTOMY  2001   LAVH, BSO   Patient Active Problem List   Diagnosis Date Noted   High risk medication use 03/25/2022   Bilateral sensorineural hearing loss 12/29/2021   Anxiety 11/24/2021   Aortic valve sclerosis 02/24/2021   Varicose veins of both lower extremities 02/24/2021   Left knee pain 04/28/2015   Coronary artery disease involving native coronary artery of native heart without angina pectoris 11/22/2013   Essential hypertension 11/22/2013   Pure hypercholesterolemia 11/22/2013   Inversion, nipple 09/05/2013   Shingles    Elevated cholesterol     Cancer (HCC)    Right knee pain 05/10/2011    PCP: Clayborne Dana, NP   REFERRING PROVIDER: Clayborne Dana, NP   REFERRING DIAG: R26.89 (ICD-10-CM) - Balance problems   THERAPY DIAG:  Unsteadiness on feet  Muscle weakness (generalized)  Other abnormalities of gait and mobility  Rationale for Evaluation and Treatment: Rehabilitation  ONSET DATE: one year or more  SUBJECTIVE:   SUBJECTIVE STATEMENT: ***.  PERTINENT HISTORY: Anxiety, CAD, HTN, B knee pain PAIN:  Are you having pain? No  PRECAUTIONS: None  WEIGHT BEARING RESTRICTIONS: No  FALLS:  Has patient fallen in last 6 months? No  LIVING ENVIRONMENT: Lives with: lives alone Lives in: House/apartment Stairs:  No, but a steep driveway (length only car and a half) Has following equipment at home: Single point cane, shower chair, and were her late husbands  OCCUPATION: retired  PLOF: Independent  PATIENT GOALS: walk and feel comfortable without fear  NEXT MD VISIT: not scheduled yet  OBJECTIVE:   DIAGNOSTIC FINDINGS:   PATIENT SURVEYS:  ABC scale  980 / 1600 = 61.3 %  COGNITION: Overall cognitive status: Within functional limits for tasks assessed    MUSCLE LENGTH: Mild bil HS and piriformis R>L  POSTURE: rounded shoulders and forward head  PALPATION: NT  LOWER EXTREMITY MMT:   MMT Right eval Left eval  Hip flexion 4+ 4+  Hip extension 4- 4-  Hip abduction 4- 4-  Hip adduction 4 4  Knee flexion 4+ 4+  Knee extension 5 5  Ankle dorsiflexion 5 5   (Blank rows = not tested)  LOWER EXTREMITY ROM: WFL   FUNCTIONAL TESTS:  5 times sit to stand: 21.4 sec  Berg Balance Scale: 42/56 Significant risk for falls Dynamic Gait Index: TBD MCTSIB: Condition 1: Avg of 3 trials: 30 sec, Condition 2: Avg of 3 trials: 30 sec, Condition 3: Avg of 3 trials: 30 sec, Condition 4: Avg of 3 trials: 30 sec, and Total Score: 120/120 Stairs: step to gait; able to do reciprocal but must pull up with B UE  with L leg.   GAIT: Distance walked: 180 Assistive device utilized: Single point cane and None Level of assistance: Complete Independence Comments: without AD: decreased step length R, Wide BOS: with cane: improved gait pattern and patient confidence   TODAY'S TREATMENT:                                                                                                                              DATE:   07/27/22 DGI, assess LB  07/20/22 See pt ed and HEP  PATIENT EDUCATION:  Education details: PT eval findings, anticipated POC, need for further assessment of low back, initial HEP, and gait safety with SPC  Person educated: Patient Education method: Explanation, Demonstration, and Handouts Education comprehension: verbalized understanding and returned demonstration  HOME EXERCISE PROGRAM: Access Code: [email or web address] URL: [email or web address] Date: 07/20/2022 Prepared by: Raynelle Fanning  Exercises - Supine Bridge  - 2 x daily - 7 x weekly - 1-2 sets - 10 reps - Sit to Stand with Armchair  - 2 x daily - 7 x weekly - 1 sets - 5-10 reps - Standing Hip Abduction with Counter Support  - 1 x daily - 7 x weekly - 2 sets - 10 reps  ASSESSMENT:  CLINICAL IMPRESSION: ***  Griffyn Clothier is a 89 y.o. female who was seen today for physical therapy evaluation and treatment for unsteadiness. She has not had any falls but reports her left leg feels like she is walking on a sponge. She has a h/o LBP, but denies pain today. She also reports pain in her bil knees R>L. She has weakness in B LE L> R and her functional weakness with balance testing and stairs is more pronounced than MMT. Her ABC scale is 61.3 %. Her confidence improved when using a SPC for gait in the clinic and patient plans to acquire one. Her BERG score of 42/56 indicates she is at significant risk for falls. She will benefit from skilled PT to address these deificits.   OBJECTIVE IMPAIRMENTS: Abnormal gait, decreased balance,  decreased strength, impaired flexibility, postural dysfunction, and pain.   ACTIVITY LIMITATIONS: stairs and locomotion level  PARTICIPATION LIMITATIONS:  community activity  PERSONAL FACTORS: 3+ comorbidities: anxiety, LBP, B knee pain  are also affecting patient's functional outcome.   REHAB POTENTIAL: Excellent  CLINICAL DECISION MAKING: Evolving/moderate complexity  EVALUATION COMPLEXITY: Moderate   GOALS: Goals reviewed with patient? Yes  SHORT TERM GOALS: Target date: 08/03/2022   Patient will be independent with initial HEP. Baseline:  Goal status: INITIAL  2.   Patient will be educated on strategies to decrease risk of falls.  Baseline:  Goal status: INITIAL   LONG TERM GOALS: Target date: 09/14/2022  Patient will be independent with advanced/ongoing HEP to improve outcomes and carryover.  Baseline:  Goal status: INITIAL  2.  Patient will be able to ambulate 600' with LRAD with good safety to access community.  Baseline:  Goal status: INITIAL  3.  Patient will be able to step up/down curb safely with LRAD for safety with community ambulation.  Baseline:  Goal status: INITIAL   4.  Patient will demonstrate improved functional LE strength as demonstrated by 5 sec decrease in 5x sit to stand time. Baseline: 21.4 sec Goal status: INITIAL  5.  Patient will demonstrate at least 19/24 on DGI to improve gait stability and reduce risk for falls. Baseline: TBD Goal status: INITIAL  6.  Patient will score 50/56 on Berg Balance test to demonstrate lower risk of falls. (MCID= 8 points) .  Baseline: 42/56 Goal status: INITIAL  7.  Patient will report >= 999 on ABC scale to demonstrate improved functional ability. Baseline:  980 / 1600 = 61.3 % Goal status: INITIAL  8.  Patient will demonstrate improved B hip ABD and ext strength to 4+/5 or better to improve function. Baseline:  Goal status: INITIAL    PLAN:  PT FREQUENCY: 2x/week  PT DURATION: 8  weeks  PLANNED INTERVENTIONS: Therapeutic exercises, Therapeutic activity, Neuromuscular re-education, Balance training, Gait training, Patient/Family education, Self Care, Joint mobilization, Stair training, Dry Needling, Electrical stimulation, Spinal mobilization, Cryotherapy, Moist heat, and Manual therapy  PLAN FOR NEXT SESSION: complete DGI, assess LB, work on gait with SPC, step ups/downs and standing hip ABD/ext strengthening;    Devaun Hernandez, PT 07/20/2022, 10:55 AM

## 2022-07-28 ENCOUNTER — Encounter: Payer: Self-pay | Admitting: Physical Therapy

## 2022-07-28 ENCOUNTER — Ambulatory Visit: Payer: Medicare HMO | Admitting: Physical Therapy

## 2022-07-28 DIAGNOSIS — M6281 Muscle weakness (generalized): Secondary | ICD-10-CM

## 2022-07-28 DIAGNOSIS — R2689 Other abnormalities of gait and mobility: Secondary | ICD-10-CM

## 2022-07-28 DIAGNOSIS — R2681 Unsteadiness on feet: Secondary | ICD-10-CM | POA: Diagnosis not present

## 2022-08-03 ENCOUNTER — Ambulatory Visit: Payer: Medicare HMO

## 2022-08-03 DIAGNOSIS — R2681 Unsteadiness on feet: Secondary | ICD-10-CM | POA: Diagnosis not present

## 2022-08-03 DIAGNOSIS — R2689 Other abnormalities of gait and mobility: Secondary | ICD-10-CM

## 2022-08-03 DIAGNOSIS — M6281 Muscle weakness (generalized): Secondary | ICD-10-CM

## 2022-08-03 NOTE — Therapy (Signed)
OUTPATIENT PHYSICAL THERAPY LOWER EXTREMITY TREATMENT   Patient Name: [name] MRN: [id number] DOB:[birth year], 89 y.o., female Today's Date: 08/03/2022  END OF SESSION:  PT End of Session - 08/03/22 1158     Visit Number 3    Date for PT Re-Evaluation 09/14/22    Authorization Type [employer]/M[employer]    Progress Note Due on Visit 10    PT Start Time 1105    PT Stop Time 1147    PT Time Calculation (min) 42 min    Activity Tolerance Patient tolerated treatment well    Behavior During Therapy WFL for tasks assessed/performed             Past Medical History:  Diagnosis Date   Aortic valve sclerosis    CAD (coronary artery disease)    S/p DES to the proximal RCA, POBA to the distal RCA 8/08 //Myoview 2009 low risk // Myoview 9/22: EF 57, no ischemia or infarction, apical thinning artifact, low risk study   DUB (dysfunctional uterine bleeding)    Elevated cholesterol    Atorvastatin   GERD (gastroesophageal reflux disease)    History of echocardiogram    EF 60-65, GRII DD, GLS -18.4, normal RVSF, moderate MAC, moderate AV calcification, trivial AI   Hypertension    Plantar fasciitis    Shingles    Past Surgical History:  Procedure Laterality Date   APPENDECTOMY  2011   CARDIAC STINT IMPLANT  2008   CARPAL TUNNEL RELEASE     EYE SURGERY     INGUINAL HERNIA REPAIR     OOPHORECTOMY  2001   LAVH,BSO   ULNA NERVE RELEASE  2009   VAGINAL HYSTERECTOMY  2001   LAVH, BSO   Patient Active Problem List   Diagnosis Date Noted   High risk medication use 03/25/2022   Bilateral sensorineural hearing loss 12/29/2021   Anxiety 11/24/2021   Aortic valve sclerosis 02/24/2021   Varicose veins of both lower extremities 02/24/2021   Left knee pain 04/28/2015   Coronary artery disease involving native coronary artery of native heart without angina pectoris 11/22/2013   Essential hypertension 11/22/2013   Pure hypercholesterolemia 11/22/2013   Inversion, nipple 09/05/2013    Shingles    Elevated cholesterol    Cancer (HCC)    Right knee pain 05/10/2011    PCP: Clayborne Dana, NP   REFERRING PROVIDER: Clayborne Dana, NP   REFERRING DIAG: R26.89 (ICD-10-CM) - Balance problems   THERAPY DIAG:  Unsteadiness on feet  Muscle weakness (generalized)  Other abnormalities of gait and mobility  Rationale for Evaluation and Treatment: Rehabilitation  ONSET DATE: one year or more  SUBJECTIVE:   SUBJECTIVE STATEMENT: No pain. No recent falls.  PERTINENT HISTORY: Anxiety, CAD, HTN, B knee pain PAIN:  Are you having pain? No  PRECAUTIONS: None  WEIGHT BEARING RESTRICTIONS: No  FALLS:  Has patient fallen in last 6 months? No  LIVING ENVIRONMENT: Lives with: lives alone Lives in: House/apartment Stairs:  No, but a steep driveway (length only car and a half) Has following equipment at home: Single point cane, shower chair, and were her late husbands  OCCUPATION: retired  PLOF: Independent  PATIENT GOALS: walk and feel comfortable without fear  NEXT MD VISIT: not scheduled yet  OBJECTIVE:   DIAGNOSTIC FINDINGS:   PATIENT SURVEYS:  ABC scale  980 / 1600 = 61.3 %  COGNITION: Overall cognitive status: Within functional limits for tasks assessed    MUSCLE LENGTH: Mild bil HS and  piriformis R>L  POSTURE: rounded shoulders and forward head  PALPATION: NT  LOWER EXTREMITY MMT:   MMT Right eval Left eval  Hip flexion 4+ 4+  Hip extension 4- 4-  Hip abduction 4- 4-  Hip adduction 4 4  Knee flexion 4+ 4+  Knee extension 5 5  Ankle dorsiflexion 5 5   (Blank rows = not tested)  LOWER EXTREMITY ROM: WFL  LUMBAR ROM:  flex to shins, extension < 10 deg, bil rotation 25% bil a little unsteady   FUNCTIONAL TESTS:  5 times sit to stand: 21.4 sec  Berg Balance Scale: 42/56 Significant risk for falls Dynamic Gait Index: 18/24  07/28/22 MCTSIB: Condition 1: Avg of 3 trials: 30 sec, Condition 2: Avg of 3 trials: 30 sec, Condition 3:  Avg of 3 trials: 30 sec, Condition 4: Avg of 3 trials: 30 sec, and Total Score: 120/120 Stairs: step to gait; able to do reciprocal but must pull up with B UE with L leg.   GAIT: Distance walked: 180 Assistive device utilized: Single point cane and None Level of assistance: Complete Independence Comments: without AD: decreased step length R, Wide BOS: with cane: improved gait pattern and patient confidence   TODAY'S TREATMENT:                                                                                                                              DATE:   08/03/22 Therapeutic Exercise: to improve strength and mobility.  Demo, verbal and tactile cues throughout for technique.  Rec Bike L1x81min LTR bil 10x3" Supine clams 10x3" Bridges 2x10 Standing hip abduction 2# x 10 B Standing hip extension 2# x 10 B Standing march 2# x 10 B  07/27/22  Therapeutic Activities: DGI, LB ROM assessed Back extension at wall x 10 3 sec hold Gait: worked on walking with SPC 3 laps focusing on increasing L stance and R step length. Piriformis stretch 2x 30 sec ea seated Seated RTB clam x 10 Standing hip ABD, ext with RTB at thighs  x 10 ea bil at counter Sidestepping with RTB at thighs 5 steps x 6  07/20/22 See pt ed and HEP  PATIENT EDUCATION:  Education details: PT eval findings, anticipated POC, need for further assessment of low back, initial HEP, and gait safety with SPC  Person educated: Patient Education method: Explanation, Demonstration, and Handouts Education comprehension: verbalized understanding and returned demonstration  HOME EXERCISE PROGRAM: Access Code: [email or web address] URL: [email or web address] Date: 08/03/2022 Prepared by: Verta Ellen  Exercises - Supine Bridge  - 2 x daily - 7 x weekly - 1-2 sets - 10 reps - Sit to Stand with Armchair  - 2 x daily - 7 x weekly - 1 sets - 5-10 reps - Standing Hip Abduction with Counter Support  - 1 x daily - 3 x weekly - 2 sets -  10 reps - Seated Piriformis Stretch  - 1 x daily - 7 x weekly -  1 sets - 3 reps - 30-60 sec hold - Standing Hip Extension with Resistance at Ankles and Counter Support  - 1 x daily - 3 x weekly - 3 sets - 10 reps - Side Stepping with Resistance at Thighs and Counter Support  - 1 x daily - 3 x weekly - 3 sets - 10 reps - Standing Lumbar Extension at Wall - Forearms  - 2 x daily - 7 x weekly - 1 sets - 5 reps - Seated Hip Abduction with Resistance  - 1 x daily - 3 x weekly - 3 sets - 10 reps - Supine Lower Trunk Rotation  - 2 x daily - 7 x weekly - 2 sets - 10 reps  ASSESSMENT:  CLINICAL IMPRESSION: Reviewed fall prevention techniques with patient. Progressed exercises focusing on hip strengthening for proximal stability. Cues with STS to scoot fwd and for anterior WS to allow for more ease with transfer. Attempted mini squats at counter but too much knee pain so we deferred this exercise. Salam continues to demonstrate potential for improvement and would benefit from continued skilled therapy to address impairments.    OBJECTIVE IMPAIRMENTS: Abnormal gait, decreased balance, decreased strength, impaired flexibility, postural dysfunction, and pain.   ACTIVITY LIMITATIONS: stairs and locomotion level  PARTICIPATION LIMITATIONS: community activity  PERSONAL FACTORS: 3+ comorbidities: anxiety, LBP, B knee pain  are also affecting patient's functional outcome.   REHAB POTENTIAL: Excellent  CLINICAL DECISION MAKING: Evolving/moderate complexity  EVALUATION COMPLEXITY: Moderate   GOALS: Goals reviewed with patient? Yes  SHORT TERM GOALS: Target date: 08/03/2022   Patient will be independent with initial HEP. Baseline:  Goal status: MET- 08/03/22  2.   Patient will be educated on strategies to decrease risk of falls.  Baseline:  Goal status: MET- 08/03/22   LONG TERM GOALS: Target date: 09/14/2022  Patient will be independent with advanced/ongoing HEP to improve outcomes and carryover.   Baseline:  Goal status: IN PROGRESS  2.  Patient will be able to ambulate 600' with LRAD with good safety to access community.  Baseline:  Goal status: IN PROGRESS  3.  Patient will be able to step up/down curb safely with LRAD for safety with community ambulation.  Baseline:  Goal status: IN PROGRESS   4.  Patient will demonstrate improved functional LE strength as demonstrated by 5 sec decrease in 5x sit to stand time. Baseline: 21.4 sec Goal status: IN PROGRESS  5.  Patient will demonstrate at least 19/24 on DGI to improve gait stability and reduce risk for falls. Baseline: 18/24 on 07/28/22 Goal status: IN PROGRESS  6.  Patient will score 50/56 on Berg Balance test to demonstrate lower risk of falls. (MCID= 8 points) .  Baseline: 42/56 Goal status: IN PROGRESS  7.  Patient will report >= 999 on ABC scale to demonstrate improved functional ability. Baseline:  980 / 1600 = 61.3 % Goal status: IN PROGRESS  8.  Patient will demonstrate improved B hip ABD and ext strength to 4+/5 or better to improve function. Baseline:  Goal status: IN PROGRESS    PLAN:  PT FREQUENCY: 2x/week  PT DURATION: 8 weeks  PLANNED INTERVENTIONS: Therapeutic exercises, Therapeutic activity, Neuromuscular re-education, Balance training, Gait training, Patient/Family education, Self Care, Joint mobilization, Stair training, Dry Needling, Electrical stimulation, Spinal mobilization, Cryotherapy, Moist heat, and Manual therapy  PLAN FOR NEXT SESSION: print out LTR for HEP; work on gait with SPC, step ups/downs and standing hip ABD/ext strengthening;     Keatin Benham  Caprice Red, PTA 08/03/2022, 11:58 AM

## 2022-08-04 NOTE — Therapy (Signed)
OUTPATIENT PHYSICAL THERAPY TREATMENT   Patient Name: [name] MRN: [id number] DOB:08/18/1926, 89 y.o., female Today's Date: 08/05/2022  END OF SESSION:  PT End of Session - 08/05/22 1023     Visit Number 4    Date for PT Re-Evaluation 09/14/22    Authorization Type Aet[employer]/MCR    Progress Note Due on Visit 10    PT Start Time 1023    PT Stop Time 1109    PT Time Calculation (min) 46 min    Activity Tolerance Patient tolerated treatment well    Behavior During Therapy [facility] for tasks assessed/performed              Past Medical History:  Diagnosis Date   Aortic valve sclerosis    CAD (coronary artery disease)    S/p DES to the proximal RCA, POBA to the distal RCA 8/08 //Myoview 2009 low risk // Myoview 9/22: EF 57, no ischemia or infarction, apical thinning artifact, low risk study   DUB (dysfunctional uterine bleeding)    Elevated cholesterol    Atorvastatin   GERD (gastroesophageal reflux disease)    History of echocardiogram    EF 60-65, GRII DD, GLS -18.4, normal RVSF, moderate MAC, moderate AV calcification, trivial AI   Hypertension    Plantar fasciitis    Shingles    Past Surgical History:  Procedure Laterality Date   APPENDECTOMY  2011   CARDIAC STINT IMPLANT  2008   CARPAL TUNNEL RELEASE     EYE SURGERY     INGUINAL HERNIA REPAIR     OOPHORECTOMY  2001   LAVH,BSO   ULNA NERVE RELEASE  2009   VAGINAL HYSTERECTOMY  2001   LAVH, BSO   Patient Active Problem List   Diagnosis Date Noted   High risk medication use 03/25/2022   Bilateral sensorineural hearing loss 12/29/2021   Anxiety 11/24/2021   Aortic valve sclerosis 02/24/2021   Varicose veins of both lower extremities 02/24/2021   Left knee pain 04/28/2015   Coronary artery disease involving native coronary artery of native heart without angina pectoris 11/22/2013   Essential hypertension 11/22/2013   Pure hypercholesterolemia 11/22/2013   Inversion, nipple 09/05/2013   Shingles     Elevated cholesterol    Cancer (HCC)    Right knee pain 05/10/2011    PCP: Clayborne Dana, NP  REFERRING PROVIDER: Clayborne Dana, NP  REFERRING DIAG: R26.89 (ICD-10-CM) - Balance problems   THERAPY DIAG:  Unsteadiness on feet  Muscle weakness (generalized)  Other abnormalities of gait and mobility  RATIONALE FOR EVALUATION AND TREATMENT: Rehabilitation  ONSET DATE: one year or more  NEXT MD VISIT: not scheduled yet   SUBJECTIVE:   SUBJECTIVE STATEMENT: Pt reports her L LE (knee down) always bothers her some  PAIN:  Are you having pain? Yes: NPRS scale: 1-2/10 Pain location: L leg (knee down) Pain description: "weak"  PERTINENT HISTORY: Anxiety, CAD, HTN, B knee pain  PRECAUTIONS: None  WEIGHT BEARING RESTRICTIONS: No  FALLS:  Has patient fallen in last 6 months? No  LIVING ENVIRONMENT: Lives with: lives alone Lives in: House/apartment Stairs:  No, but a steep driveway (length only car and a half) Has following equipment at home: Single point cane, shower chair, and were her late husbands  OCCUPATION: retired  PLOF: Independent  PATIENT GOALS: walk and feel comfortable without fear   OBJECTIVE:   DIAGNOSTIC FINDINGS:   PATIENT SURVEYS:  ABC scale  980 / 1600 = 61.3 %  COGNITION:  Overall cognitive status: Within functional limits for tasks assessed    MUSCLE LENGTH: Mild bil HS and piriformis R>L  POSTURE: rounded shoulders and forward head  PALPATION: NT  LOWER EXTREMITY MMT:   MMT Right eval Left eval  Hip flexion 4+ 4+  Hip extension 4- 4-  Hip abduction 4- 4-  Hip adduction 4 4  Knee flexion 4+ 4+  Knee extension 5 5  Ankle dorsiflexion 5 5   (Blank rows = not tested)  LOWER EXTREMITY ROM: WFL  LUMBAR ROM:  flex to shins, extension < 10 deg, bil rotation 25% bil a little unsteady  FUNCTIONAL TESTS:  5 times sit to stand: 21.4 sec  Berg Balance Scale: 42/56 Significant risk for falls Dynamic Gait Index: 18/24   07/28/22 MCTSIB: Condition 1: Avg of 3 trials: 30 sec, Condition 2: Avg of 3 trials: 30 sec, Condition 3: Avg of 3 trials: 30 sec, Condition 4: Avg of 3 trials: 30 sec, and Total Score: 120/120 Stairs: step to gait; able to do reciprocal but must pull up with B UE with L leg.   GAIT: Distance walked: 180 Assistive device utilized: Single point cane and None Level of assistance: Complete Independence Comments: without AD: decreased step length R, Wide BOS: with cane: improved gait pattern and patient confidence   TODAY'S TREATMENT:                                                                                                                              DATE:    08/05/22 THERAPEUTIC EXERCISE: to improve flexibility, strength and mobility.  Verbal and tactile cues throughout for technique.  NuStep - L4 x 6 min (UE/LE) Hooklying LTR 10 x 5-10" Bridge + RTB hip ABD isometric 10 x 3" TrA + RTB bent-knee fallout 10 x 3" S/L RTB clam 10 x 3" each side  NEUROMUSCULAR RE-EDUCATION: To improve balance, proprioception, coordination, and reduce fall risk.  Tandem stance at counter with light UE support x 30" with forward Tandem gait forward and back with single UE support on counter 2 x 10 ft Corner balance progression with narrow BOS on firm surface with arms crossed on chest            Eyes open: - static stance x 30 sec - horiz head turns x 5 - vertical head nods x 5 - trunk rotation x 5 Eyes closed: - static stance x 10 sec   08/03/22 Therapeutic Exercise: to improve strength and mobility.  Demo, verbal and tactile cues throughout for technique.  Rec Bike L1x77min LTR bil 10x3" Supine clams 10x3" Bridges 2x10 Standing hip abduction 2# x 10 B Standing hip extension 2# x 10 B Standing march 2# x 10 B   07/27/22  Therapeutic Activities: DGI, LB ROM assessed Back extension at wall x 10 3 sec hold Gait: worked on walking with SPC 3 laps focusing on increasing L stance and R step  length. Piriformis stretch  2x 30 sec ea seated Seated RTB clam x 10 Standing hip ABD, ext with RTB at thighs  x 10 ea bil at counter Sidestepping with RTB at thighs 5 steps x 6    PATIENT EDUCATION:  Education details: PT eval findings, anticipated POC, need for further assessment of low back, initial HEP, and gait safety with SPC Person educated: Patient Education method: Explanation, Demonstration, and Handouts Education comprehension: verbalized understanding and returned demonstration  HOME EXERCISE PROGRAM: Access Code: [email or web address] URL: [email or web address] Date: 08/05/2022 Prepared by: Glenetta Hew  Exercises - Supine Bridge  - 2 x daily - 7 x weekly - 1-2 sets - 10 reps - Sit to Stand with Armchair  - 2 x daily - 7 x weekly - 1 sets - 5-10 reps - Standing Hip Abduction with Counter Support  - 1 x daily - 3 x weekly - 2 sets - 10 reps - Seated Piriformis Stretch  - 1 x daily - 7 x weekly - 1 sets - 3 reps - 30-60 sec hold - Standing Hip Extension with Resistance at Ankles and Counter Support  - 1 x daily - 3 x weekly - 3 sets - 10 reps - Side Stepping with Resistance at Thighs and Counter Support  - 1 x daily - 3 x weekly - 3 sets - 10 reps - Standing Lumbar Extension at Wall - Forearms  - 2 x daily - 7 x weekly - 1 sets - 5 reps - Seated Hip Abduction with Resistance  - 1 x daily - 3 x weekly - 3 sets - 10 reps - Supine Lower Trunk Rotation  - 2 x daily - 7 x weekly - 2 sets - 10 reps - 5-10 sec hold - Corner Balance Feet Together With Eyes Open  - 1 x daily - 7 x weekly - 3 reps - 30 sec hold - Corner Balance Feet Together: Eyes Open With Head Turns  - 1 x daily - 7 x weekly - 2 sets - 5 reps - Semi-Tandem Corner Balance With Eyes Open  - 1 x daily - 7 x weekly - 3 reps - 20-30 sec hold  ASSESSMENT:  CLINICAL IMPRESSION: Priscila reports HEP going well and denies need for review, however did review LTR before providing HEP handout. Progressed lumbopelvic  strengthening adding increased resistance with RTB or positioning to increase gravity influence.  Initiated neuromuscular reeducation for balance training focusing on narrow base of support with static and dynamic stance.  Patient able to perform corner balance activities safely, therefore included in HEP update today.  Maddelyn will continue to benefit from skilled PT to address strength and balance deficits to improve mobility and activity tolerance with decreased pain interference and decreased risk for falls.   OBJECTIVE IMPAIRMENTS: Abnormal gait, decreased balance, decreased strength, impaired flexibility, postural dysfunction, and pain.   ACTIVITY LIMITATIONS: stairs and locomotion level  PARTICIPATION LIMITATIONS: community activity  PERSONAL FACTORS: 3+ comorbidities: anxiety, LBP, B knee pain  are also affecting patient's functional outcome.   REHAB POTENTIAL: Excellent  CLINICAL DECISION MAKING: Evolving/moderate complexity  EVALUATION COMPLEXITY: Moderate   GOALS: Goals reviewed with patient? Yes  SHORT TERM GOALS: Target date: 08/03/2022   Patient will be independent with initial HEP. Baseline:  Goal status: MET  08/03/22  2.   Patient will be educated on strategies to decrease risk of falls.  Baseline:  Goal status: MET  08/03/22   LONG TERM GOALS: Target date: 09/14/2022  Patient will be independent with advanced/ongoing  HEP to improve outcomes and carryover.  Baseline:  Goal status: IN PROGRESS  2.  Patient will be able to ambulate 600' with LRAD with good safety to access community.  Baseline:  Goal status: IN PROGRESS  3.  Patient will be able to step up/down curb safely with LRAD for safety with community ambulation.  Baseline:  Goal status: IN PROGRESS   4.  Patient will demonstrate improved functional LE strength as demonstrated by 5 sec decrease in 5x sit to stand time. Baseline: 21.4 sec Goal status: IN PROGRESS  5.  Patient will demonstrate at least  19/24 on DGI to improve gait stability and reduce risk for falls. Baseline: 18/24 on 07/28/22 Goal status: IN PROGRESS  6.  Patient will score 50/56 on Berg Balance test to demonstrate lower risk of falls. (MCID= 8 points) .  Baseline: 42/56 Goal status: IN PROGRESS  7.  Patient will report >= 999 on ABC scale to demonstrate improved functional ability. Baseline:  980 / 1600 = 61.3 % Goal status: IN PROGRESS  8.  Patient will demonstrate improved B hip ABD and ext strength to 4+/5 or better to improve function. Baseline:  Goal status: IN PROGRESS   PLAN:  PT FREQUENCY: 2x/week  PT DURATION: 8 weeks  PLANNED INTERVENTIONS: Therapeutic exercises, Therapeutic activity, Neuromuscular re-education, Balance training, Gait training, Patient/Family education, Self Care, Joint mobilization, Stair training, Dry Needling, Electrical stimulation, Spinal mobilization, Cryotherapy, Moist heat, and Manual therapy  PLAN FOR NEXT SESSION: work on gait with SPC; step ups/downs and standing hip ABD/ext strengthening; NMR/balance training   Marry Guan, PT 08/05/2022, 12:20 PM

## 2022-08-05 ENCOUNTER — Encounter: Payer: Self-pay | Admitting: Physical Therapy

## 2022-08-05 ENCOUNTER — Ambulatory Visit: Payer: Medicare HMO | Admitting: Physical Therapy

## 2022-08-05 DIAGNOSIS — R2681 Unsteadiness on feet: Secondary | ICD-10-CM

## 2022-08-05 DIAGNOSIS — M6281 Muscle weakness (generalized): Secondary | ICD-10-CM

## 2022-08-05 DIAGNOSIS — R2689 Other abnormalities of gait and mobility: Secondary | ICD-10-CM

## 2022-08-11 ENCOUNTER — Encounter: Payer: Self-pay | Admitting: Physical Therapy

## 2022-08-11 ENCOUNTER — Ambulatory Visit: Payer: Medicare HMO | Admitting: Physical Therapy

## 2022-08-11 DIAGNOSIS — R2681 Unsteadiness on feet: Secondary | ICD-10-CM

## 2022-08-11 DIAGNOSIS — M6281 Muscle weakness (generalized): Secondary | ICD-10-CM

## 2022-08-11 DIAGNOSIS — R2689 Other abnormalities of gait and mobility: Secondary | ICD-10-CM

## 2022-08-11 NOTE — Therapy (Signed)
OUTPATIENT PHYSICAL THERAPY TREATMENT   Patient Name: [name] MRN: [id number] DOB:01-Feb-1927, 89 y.o., female Today's Date: 08/11/2022  END OF SESSION:  PT End of Session - 08/11/22 1104     Visit Number 5    Date for PT Re-Evaluation 09/14/22    Authorization Type Aetna/MCR    Progress Note Due on Visit 10    PT Start Time 1104    PT Stop Time 1144    PT Time Calculation (min) 40 min    Activity Tolerance Patient tolerated treatment well    Behavior During Therapy [facility] for tasks assessed/performed               Past Medical History:  Diagnosis Date   Aortic valve sclerosis    CAD (coronary artery disease)    S/p DES to the proximal RCA, POBA to the distal RCA 8/08 //Myoview 2009 low risk // Myoview 9/22: EF 57, no ischemia or infarction, apical thinning artifact, low risk study   DUB (dysfunctional uterine bleeding)    Elevated cholesterol    Atorvastatin   GERD (gastroesophageal reflux disease)    History of echocardiogram    EF 60-65, GRII DD, GLS -18.4, normal RVSF, moderate MAC, moderate AV calcification, trivial AI   Hypertension    Plantar fasciitis    Shingles    Past Surgical History:  Procedure Laterality Date   APPENDECTOMY  2011   CARDIAC STINT IMPLANT  2008   CARPAL TUNNEL RELEASE     EYE SURGERY     INGUINAL HERNIA REPAIR     OOPHORECTOMY  2001   LAVH,BSO   ULNA NERVE RELEASE  2009   VAGINAL HYSTERECTOMY  2001   LAVH, BSO   Patient Active Problem List   Diagnosis Date Noted   High risk medication use 03/25/2022   Bilateral sensorineural hearing loss 12/29/2021   Anxiety 11/24/2021   Aortic valve sclerosis 02/24/2021   Varicose veins of both lower extremities 02/24/2021   Left knee pain 04/28/2015   Coronary artery disease involving native coronary artery of native heart without angina pectoris 11/22/2013   Essential hypertension 11/22/2013   Pure hypercholesterolemia 11/22/2013   Inversion, nipple 09/05/2013   Shingles     Elevated cholesterol    Cancer (HCC)    Right knee pain 05/10/2011    PCP: Clayborne Dana, NP  REFERRING PROVIDER: Clayborne Dana, NP  REFERRING DIAG: R26.89 (ICD-10-CM) - Balance problems   THERAPY DIAG:  Unsteadiness on feet  Muscle weakness (generalized)  Other abnormalities of gait and mobility  RATIONALE FOR EVALUATION AND TREATMENT: Rehabilitation  ONSET DATE: one year or more  NEXT MD VISIT: not scheduled yet   SUBJECTIVE:   SUBJECTIVE STATEMENT: Pt reports her L LE (knee down) always bothers her some but her back typically only bothers her if she is on her feet too much.  PAIN:  Are you having pain? Yes: NPRS scale:  2/10 Pain location: L leg (knee down) Pain description: "weak"   Are you having pain? Yes: NPRS scale: up to 7/10 Pain location: low back Pain description: dull constant Aggravating factors: prolonged standing Relieving factors: sitting down, standing exercises  PERTINENT HISTORY: Anxiety, CAD, HTN, B knee pain  PRECAUTIONS: None  WEIGHT BEARING RESTRICTIONS: No  FALLS:  Has patient fallen in last 6 months? No  LIVING ENVIRONMENT: Lives with: lives alone Lives in: House/apartment Stairs:  No, but a steep driveway (length only car and a half) Has following equipment at home: Single  point cane, shower chair, and were her late husbands  OCCUPATION: retired  PLOF: Independent  PATIENT GOALS: walk and feel comfortable without fear   OBJECTIVE:   DIAGNOSTIC FINDINGS:   PATIENT SURVEYS:  ABC scale  980 / 1600 = 61.3 %  COGNITION: Overall cognitive status: Within functional limits for tasks assessed    MUSCLE LENGTH: Mild bil HS and piriformis R>L  POSTURE: rounded shoulders and forward head  PALPATION: NT  LOWER EXTREMITY MMT:   MMT Right eval Left eval  Hip flexion 4+ 4+  Hip extension 4- 4-  Hip abduction 4- 4-  Hip adduction 4 4  Knee flexion 4+ 4+  Knee extension 5 5  Ankle dorsiflexion 5 5   (Blank rows =  not tested)  LOWER EXTREMITY ROM: WFL  LUMBAR ROM:  flex to shins, extension < 10 deg, bil rotation 25% bil a little unsteady  FUNCTIONAL TESTS:  5 times sit to stand: 21.4 sec  Berg Balance Scale: 42/56 Significant risk for falls Dynamic Gait Index: 18/24  07/28/22 MCTSIB: Condition 1: Avg of 3 trials: 30 sec, Condition 2: Avg of 3 trials: 30 sec, Condition 3: Avg of 3 trials: 30 sec, Condition 4: Avg of 3 trials: 30 sec, and Total Score: 120/120 Stairs: step to gait; able to do reciprocal but must pull up with B UE with L leg.   GAIT: Distance walked: 180 Assistive device utilized: Single point cane and None Level of assistance: Complete Independence Comments: without AD: decreased step length R, Wide BOS: with cane: improved gait pattern and patient confidence   TODAY'S TREATMENT:                                                                                                                              DATE:    08/11/22 THERAPEUTIC EXERCISE: to improve flexibility, strength and mobility.  Verbal and tactile cues throughout for technique.  NuStep - L5 x 6 min (UE/LE) - Seat #11/Handles #12 Seated RTB TB row 10 x 5" Seated RTB scap retraction + shoulder extension 10 x 5" - pt with tendency to lean forward and round back Seated scapular resetting/retraction & depression with pool noodle along spine in chair 10 x 5" Seated RTB pallof press x 10 bil Fwd step-up and step-down to/from 6" step x 10, single UE support on window ledge Fwd step-up and over 6" step x 10, single UE support on window ledge Lateral step-up and step-down to/from 6" step x 10 bil, B UE support on window ledge   08/05/22 THERAPEUTIC EXERCISE: to improve flexibility, strength and mobility.  Verbal and tactile cues throughout for technique.  NuStep - L4 x 6 min (UE/LE) Hooklying LTR 10 x 5-10" Bridge + RTB hip ABD isometric 10 x 3" TrA + RTB bent-knee fallout 10 x 3" S/L RTB clam 10 x 3" each  side  NEUROMUSCULAR RE-EDUCATION: To improve balance, proprioception, coordination, and reduce fall risk.  Tandem stance  at counter with light UE support x 30" with forward Tandem gait forward and back with single UE support on counter 2 x 10 ft Corner balance progression with narrow BOS on firm surface with arms crossed on chest            Eyes open: - static stance x 30 sec - horiz head turns x 5 - vertical head nods x 5 - trunk rotation x 5 Eyes closed: - static stance x 10 sec   08/03/22  Therapeutic Exercise: to improve strength and mobility.  Demo, verbal and tactile cues throughout for technique.  Rec Bike L1x72min LTR bil 10x3" Supine clams 10x3" Bridges 2x10 Standing hip abduction 2# x 10 B Standing hip extension 2# x 10 B Standing march 2# x 10 B   PATIENT EDUCATION:  Education details: PT eval findings, anticipated POC, need for further assessment of low back, initial HEP, and gait safety with SPC Person educated: Patient Education method: Explanation, Demonstration, and Handouts Education comprehension: verbalized understanding and returned demonstration  HOME EXERCISE PROGRAM: Access Code: [email or web address] URL: [email or web address] Date: 08/11/2022 Prepared by: Glenetta Hew  Exercises - Supine Bridge  - 2 x daily - 7 x weekly - 1-2 sets - 10 reps - Sit to Stand with Armchair  - 2 x daily - 7 x weekly - 1 sets - 5-10 reps - Standing Hip Abduction with Counter Support  - 1 x daily - 3 x weekly - 2 sets - 10 reps - Seated Piriformis Stretch  - 1 x daily - 7 x weekly - 1 sets - 3 reps - 30-60 sec hold - Standing Hip Extension with Resistance at Ankles and Counter Support  - 1 x daily - 3 x weekly - 3 sets - 10 reps - Side Stepping with Resistance at Thighs and Counter Support  - 1 x daily - 3 x weekly - 3 sets - 10 reps - Standing Lumbar Extension at Wall - Forearms  - 2 x daily - 7 x weekly - 1 sets - 5 reps - Seated Hip Abduction with Resistance  - 1 x  daily - 3 x weekly - 3 sets - 10 reps - Supine Lower Trunk Rotation  - 2 x daily - 7 x weekly - 2 sets - 10 reps - 5-10 sec hold - Corner Balance Feet Together With Eyes Open  - 1 x daily - 7 x weekly - 3 reps - 30 sec hold - Corner Balance Feet Together: Eyes Open With Head Turns  - 1 x daily - 7 x weekly - 2 sets - 5 reps - Semi-Tandem Corner Balance With Eyes Open  - 1 x daily - 7 x weekly - 3 reps - 20-30 sec hold - Seated Scapular Resetting  - 1 x daily - 7 x weekly - 2 sets - 10 reps - 3 sec hold   ASSESSMENT:  CLINICAL IMPRESSION: [name] continues to complain of intermittent back pain most common with prolonged standing, therefore initiated exercises focusing on core strengthening and postural correction with patient noting that scapular retraction/resetting with trunk extension feels good for her back.  Progressed lower extremity strengthening with functional step-ups both forward and lateral.  Slight instability noted during forward step-ups with PT providing close guarding and patient needing intermittent rest breaks due to fatigue.  HEP updated to include scapular retraction/resetting with trunk extension, but deferred addition of TB resisted scapular exercises until patient able to maintain good posture during these exercises in therapy.  OBJECTIVE IMPAIRMENTS: Abnormal gait, decreased balance, decreased strength, impaired flexibility, postural dysfunction, and pain.   ACTIVITY LIMITATIONS: stairs and locomotion level  PARTICIPATION LIMITATIONS: community activity  PERSONAL FACTORS: 3+ comorbidities: anxiety, LBP, B knee pain  are also affecting patient's functional outcome.   REHAB POTENTIAL: Excellent  CLINICAL DECISION MAKING: Evolving/moderate complexity  EVALUATION COMPLEXITY: Moderate   GOALS: Goals reviewed with patient? Yes  SHORT TERM GOALS: Target date: 08/03/2022   Patient will be independent with initial HEP. Baseline:  Goal status: MET  08/03/22  2.   Patient  will be educated on strategies to decrease risk of falls.  Baseline:  Goal status: MET  08/03/22   LONG TERM GOALS: Target date: 09/14/2022  Patient will be independent with advanced/ongoing HEP to improve outcomes and carryover.  Baseline:  Goal status: IN PROGRESS  2.  Patient will be able to ambulate 600' with LRAD with good safety to access community.  Baseline:  Goal status: IN PROGRESS  3.  Patient will be able to step up/down curb safely with LRAD for safety with community ambulation.  Baseline:  Goal status: IN PROGRESS   4.  Patient will demonstrate improved functional LE strength as demonstrated by 5 sec decrease in 5x sit to stand time. Baseline: 21.4 sec Goal status: IN PROGRESS  5.  Patient will demonstrate at least 19/24 on DGI to improve gait stability and reduce risk for falls. Baseline: 18/24 on 07/28/22 Goal status: IN PROGRESS  6.  Patient will score 50/56 on Berg Balance test to demonstrate lower risk of falls. (MCID= 8 points) .  Baseline: 42/56 Goal status: IN PROGRESS  7.  Patient will report >= 999 on ABC scale to demonstrate improved functional ability. Baseline:  980 / 1600 = 61.3 % Goal status: IN PROGRESS  8.  Patient will demonstrate improved B hip ABD and ext strength to 4+/5 or better to improve function. Baseline:  Goal status: IN PROGRESS   PLAN:  PT FREQUENCY: 2x/week  PT DURATION: 8 weeks  PLANNED INTERVENTIONS: Therapeutic exercises, Therapeutic activity, Neuromuscular re-education, Balance training, Gait training, Patient/Family education, Self Care, Joint mobilization, Stair training, Dry Needling, Electrical stimulation, Spinal mobilization, Cryotherapy, Moist heat, and Manual therapy  PLAN FOR NEXT SESSION: work on gait with SPC; step ups/downs and standing hip ABD/ext strengthening; NMR/balance training   Marry Guan, PT 08/11/2022, 11:45 AM

## 2022-08-13 ENCOUNTER — Other Ambulatory Visit: Payer: Self-pay | Admitting: Family

## 2022-08-13 ENCOUNTER — Encounter: Payer: Self-pay | Admitting: Physical Therapy

## 2022-08-13 ENCOUNTER — Telehealth: Payer: Self-pay | Admitting: Family Medicine

## 2022-08-13 ENCOUNTER — Ambulatory Visit: Payer: Medicare HMO | Admitting: Physical Therapy

## 2022-08-13 DIAGNOSIS — R2681 Unsteadiness on feet: Secondary | ICD-10-CM

## 2022-08-13 DIAGNOSIS — M6281 Muscle weakness (generalized): Secondary | ICD-10-CM

## 2022-08-13 DIAGNOSIS — F419 Anxiety disorder, unspecified: Secondary | ICD-10-CM

## 2022-08-13 DIAGNOSIS — R2689 Other abnormalities of gait and mobility: Secondary | ICD-10-CM

## 2022-08-13 MED ORDER — CLORAZEPATE DIPOTASSIUM 3.75 MG PO TABS: 3.7500 mg | ORAL_TABLET | Freq: Two times a day (BID) | ORAL | 0 refills | Status: DC | PRN

## 2022-08-13 NOTE — Therapy (Signed)
OUTPATIENT PHYSICAL THERAPY TREATMENT   Patient Name: [name] MRN: [id number] DOB:1926/06/24, 89 y.o., female Today's Date: 08/13/2022  END OF SESSION:  PT End of Session - 08/13/22 1017     Visit Number 6    Date for PT Re-Evaluation 09/14/22    Authorization Type [employer]/M[employer]    Progress Note Due on Visit 10    PT Start Time 1017    PT Stop Time 1059    PT Time Calculation (min) 42 min    Activity Tolerance Patient tolerated treatment well    Behavior During Therapy WFL for tasks assessed/performed               Past Medical History:  Diagnosis Date   Aortic valve sclerosis    CAD (coronary artery disease)    S/p DES to the proximal RCA, POBA to the distal RCA 8/08 //Myoview 2009 low risk // Myoview 9/22: EF 57, no ischemia or infarction, apical thinning artifact, low risk study   DUB (dysfunctional uterine bleeding)    Elevated cholesterol    Atorvastatin   GERD (gastroesophageal reflux disease)    History of echocardiogram    EF 60-65, GRII DD, GLS -18.4, normal RVSF, moderate MAC, moderate AV calcification, trivial AI   Hypertension    Plantar fasciitis    Shingles    Past Surgical History:  Procedure Laterality Date   APPENDECTOMY  2011   CARDIAC STINT IMPLANT  2008   CARPAL TUNNEL RELEASE     EYE SURGERY     INGUINAL HERNIA REPAIR     OOPHORECTOMY  2001   LAVH,BSO   ULNA NERVE RELEASE  2009   VAGINAL HYSTERECTOMY  2001   LAVH, BSO   Patient Active Problem List   Diagnosis Date Noted   High risk medication use 03/25/2022   Bilateral sensorineural hearing loss 12/29/2021   Anxiety 11/24/2021   Aortic valve sclerosis 02/24/2021   Varicose veins of both lower extremities 02/24/2021   Left knee pain 04/28/2015   Coronary artery disease involving native coronary artery of native heart without angina pectoris 11/22/2013   Essential hypertension 11/22/2013   Pure hypercholesterolemia 11/22/2013   Inversion, nipple 09/05/2013   Shingles     Elevated cholesterol    Cancer (HCC)    Right knee pain 05/10/2011    PCP: Clayborne Dana, NP  REFERRING PROVIDER: Clayborne Dana, NP  REFERRING DIAG: R26.89 (ICD-10-CM) - Balance problems   THERAPY DIAG:  Unsteadiness on feet  Muscle weakness (generalized)  Other abnormalities of gait and mobility  RATIONALE FOR EVALUATION AND TREATMENT: Rehabilitation  ONSET DATE: one year or more  NEXT MD VISIT: not scheduled yet   SUBJECTIVE:   SUBJECTIVE STATEMENT: Pt brought all of her HEP handouts for guidance on which ones to focus on going forward.Marland Kitchen  PAIN:  Are you having pain? Yes: NPRS scale:  2/10 Pain location: L knee Pain description: "creaks"   Are you having pain? No - low back  PERTINENT HISTORY: Anxiety, CAD, HTN, B knee pain  PRECAUTIONS: None  WEIGHT BEARING RESTRICTIONS: No  FALLS:  Has patient fallen in last 6 months? No  LIVING ENVIRONMENT: Lives with: lives alone Lives in: House/apartment Stairs:  No, but a steep driveway (length only car and a half) Has following equipment at home: Single point cane, shower chair, and were her late husbands  OCCUPATION: retired  PLOF: Independent  PATIENT GOALS: walk and feel comfortable without fear   OBJECTIVE:   DIAGNOSTIC FINDINGS:  PATIENT SURVEYS:  ABC scale  980 / 1600 = 61.3 %  COGNITION: Overall cognitive status: Within functional limits for tasks assessed    MUSCLE LENGTH: Mild bil HS and piriformis R>L  POSTURE: rounded shoulders and forward head  PALPATION: NT  LOWER EXTREMITY MMT:   MMT Right eval Left eval  Hip flexion 4+ 4+  Hip extension 4- 4-  Hip abduction 4- 4-  Hip adduction 4 4  Knee flexion 4+ 4+  Knee extension 5 5  Ankle dorsiflexion 5 5   (Blank rows = not tested)  LOWER EXTREMITY ROM: WFL  LUMBAR ROM:  flex to shins, extension < 10 deg, bil rotation 25% bil a little unsteady  FUNCTIONAL TESTS:  5 times sit to stand: 21.4 sec  Berg Balance Scale: 42/56  Significant risk for falls Dynamic Gait Index: 18/24  07/28/22 MCTSIB: Condition 1: Avg of 3 trials: 30 sec, Condition 2: Avg of 3 trials: 30 sec, Condition 3: Avg of 3 trials: 30 sec, Condition 4: Avg of 3 trials: 30 sec, and Total Score: 120/120 Stairs: step to gait; able to do reciprocal but must pull up with B UE with L leg.   GAIT: Distance walked: 180 Assistive device utilized: Single point cane and None Level of assistance: Complete Independence Comments: without AD: decreased step length R, Wide BOS: with cane: improved gait pattern and patient confidence   TODAY'S TREATMENT:                                                                                                                              DATE:    08/13/22 THERAPEUTIC EXERCISE: to improve flexibility, strength and mobility.  Verbal and tactile cues throughout for technique.  NuStep - L5 x 6 min (UE/LE) - Seat #11/Handles #12 Review of current HEP adjusting frequency and indicating priorities for HEP performance at home Standing RTB TB row 10 x 5" - cues for abdominal muscle engagement Standing RTB scap retraction + shoulder extension 10 x 5" - cues for abdominal muscle engagement and upright posture Standing RTB pallof press x 10 bil  NEUROMUSCULAR RE-EDUCATION: To improve posture, balance, proprioception, coordination, and reduce fall risk. Corner balance progression with 1/2 tandem stance on firm surface with arms crossed on chest (2 sets - 1 with each foot fwd; back of chair in front for safety)            Eyes open: - static stance x 30 sec - horiz head turns x 5 - vertical head nods x 5 - trunk rotation x 5 Eyes closed: - static stance x 10 sec Corner balance progression with narrow BOS on Airex pad with arms crossed on chest            Eyes open: - static stance x 30 sec - horiz head turns x 5 - vertical head nods x 5 - trunk rotation x 5 Eyes closed: - static stance x 10 sec Standing on  Airex pad in  corner with cross-body reach to PT's hands x 10   08/11/22 THERAPEUTIC EXERCISE: to improve flexibility, strength and mobility.  Verbal and tactile cues throughout for technique.  NuStep - L5 x 6 min (UE/LE) - Seat #11/Handles #12 Seated RTB TB row 10 x 5" Seated RTB scap retraction + shoulder extension 10 x 5" - pt with tendency to lean forward and round back Seated scapular resetting/retraction & depression with pool noodle along spine in chair 10 x 5" Seated RTB pallof press x 10 bil Fwd step-up and step-down to/from 6" step x 10, single UE support on window ledge Fwd step-up and over 6" step x 10, single UE support on window ledge Lateral step-up and step-down to/from 6" step x 10 bil, B UE support on window ledge   08/05/22 THERAPEUTIC EXERCISE: to improve flexibility, strength and mobility.  Verbal and tactile cues throughout for technique.  NuStep - L4 x 6 min (UE/LE) Hooklying LTR 10 x 5-10" Bridge + RTB hip ABD isometric 10 x 3" TrA + RTB bent-knee fallout 10 x 3" S/L RTB clam 10 x 3" each side  NEUROMUSCULAR RE-EDUCATION: To improve balance, proprioception, coordination, and reduce fall risk.  Tandem stance at counter with light UE support x 30" with forward Tandem gait forward and back with single UE support on counter 2 x 10 ft Corner balance progression with narrow BOS on firm surface with arms crossed on chest            Eyes open: - static stance x 30 sec - horiz head turns x 5 - vertical head nods x 5 - trunk rotation x 5 Eyes closed: - static stance x 10 sec   PATIENT EDUCATION:  Education details: HEP review and HEP consolidation Person educated: Patient Education method: Explanation, Demonstration, and Handouts Education comprehension: verbalized understanding and returned demonstration  HOME EXERCISE PROGRAM: Access Code: [email or web address] URL: [email or web address] Date: 08/13/2022 Prepared by: Glenetta Hew  Exercises - Supine Bridge  - 2 x  daily - 3 x weekly - 1-2 sets - 10 reps - Sit to Stand with Armchair  - 2 x daily - 7 x weekly - 1 sets - 5-10 reps - Seated Piriformis Stretch  - 1 x daily - 7 x weekly - 1 sets - 3 reps - 30-60 sec hold - Standing Hip Extension with Resistance at Ankles and Counter Support  - 1 x daily - 3 x weekly - 3 sets - 10 reps - Side Stepping with Resistance at Thighs and Counter Support  - 1 x daily - 3 x weekly - 3 sets - 10 reps - Standing Lumbar Extension at Wall - Forearms  - 2 x daily - 7 x weekly - 1 sets - 5 reps - Seated Hip Abduction with Resistance  - 1 x daily - 3 x weekly - 3 sets - 10 reps - Supine Lower Trunk Rotation  - 2 x daily - 7 x weekly - 2 sets - 10 reps - 5-10 sec hold - Corner Balance Feet Together With Eyes Open  - 1 x daily - 5 x weekly - 3 reps - 30 sec hold - Corner Balance Feet Together: Eyes Open With Head Turns  - 1 x daily - 5 x weekly - 2 sets - 5 reps - Semi-Tandem Corner Balance With Eyes Open  - 1 x daily - 5 x weekly - 3 reps - 20-30 sec hold - Seated Scapular Resetting  - 1 x  daily - 7 x weekly - 2 sets - 10 reps - 3 sec hold   ASSESSMENT:  CLINICAL IMPRESSION: Jasmonique requested review of current HEP to help her identify priorities for performance at home as well as to see if any exercises no longer need to be performed - HEP reviewed and consolidated adjusting frequencies and discontinuing exercises, as well as providing guidance on scattered performance throughout the day rather than trying to focus on all the exercises at the same time.  We reviewed corner balance progression adding complexity with advancing base of support on firm surfaces as well as introducing compliant surfaces with narrow BOS - increased sway noticed but no significant LOB.  Progressed core/postural exercises to standing today to increase balance component - cues still necessary for upright posture but better maintained after initial cueing today.  Sylivia will continue to benefit from skilled PT to  address ongoing postural/core and lower extremity strength as well as balance deficits to improve mobility and activity tolerance with decreased pain interference and decreased risk for falls.   OBJECTIVE IMPAIRMENTS: Abnormal gait, decreased balance, decreased strength, impaired flexibility, postural dysfunction, and pain.   ACTIVITY LIMITATIONS: stairs and locomotion level  PARTICIPATION LIMITATIONS: community activity  PERSONAL FACTORS: 3+ comorbidities: anxiety, LBP, B knee pain  are also affecting patient's functional outcome.   REHAB POTENTIAL: Excellent  CLINICAL DECISION MAKING: Evolving/moderate complexity  EVALUATION COMPLEXITY: Moderate   GOALS: Goals reviewed with patient? Yes  SHORT TERM GOALS: Target date: 08/03/2022   Patient will be independent with initial HEP. Baseline:  Goal status: MET  08/03/22  2.   Patient will be educated on strategies to decrease risk of falls.  Baseline:  Goal status: MET  08/03/22   LONG TERM GOALS: Target date: 09/14/2022  Patient will be independent with advanced/ongoing HEP to improve outcomes and carryover.  Baseline:  Goal status: IN PROGRESS  2.  Patient will be able to ambulate 600' with LRAD with good safety to access community.  Baseline:  Goal status: IN PROGRESS  3.  Patient will be able to step up/down curb safely with LRAD for safety with community ambulation.  Baseline:  Goal status: IN PROGRESS   4.  Patient will demonstrate improved functional LE strength as demonstrated by 5 sec decrease in 5x sit to stand time. Baseline: 21.4 sec Goal status: IN PROGRESS  5.  Patient will demonstrate at least 19/24 on DGI to improve gait stability and reduce risk for falls. Baseline: 18/24 on 07/28/22 Goal status: IN PROGRESS  6.  Patient will score 50/56 on Berg Balance test to demonstrate lower risk of falls. (MCID= 8 points) .  Baseline: 42/56 Goal status: IN PROGRESS  7.  Patient will report >= 999 on ABC scale to  demonstrate improved functional ability. Baseline:  980 / 1600 = 61.3 % Goal status: IN PROGRESS  8.  Patient will demonstrate improved B hip ABD and ext strength to 4+/5 or better to improve function. Baseline:  Goal status: IN PROGRESS   PLAN:  PT FREQUENCY: 2x/week  PT DURATION: 8 weeks  PLANNED INTERVENTIONS: Therapeutic exercises, Therapeutic activity, Neuromuscular re-education, Balance training, Gait training, Patient/Family education, Self Care, Joint mobilization, Stair training, Dry Needling, Electrical stimulation, Spinal mobilization, Cryotherapy, Moist heat, and Manual therapy  PLAN FOR NEXT SESSION: work on gait with SPC; step ups/downs and standing hip ABD/ext strengthening; NMR/balance training   Marry Guan, PT 08/13/2022, 11:18 AM

## 2022-08-13 NOTE — Telephone Encounter (Signed)
Requesting: Clorazepate Contract: 12/07/2021 UDS:03/25/2022 Last Visit: 06/29/2022 Next Visit: None Last Refill: 03/25/2022, #60 x 0RF  Please Advise

## 2022-08-13 NOTE — Telephone Encounter (Signed)
Prescription Request  08/13/2022  Is this a "Controlled Substance" medicine? No  LOV: 06/29/2022  What is the name of the medication or equipment?   clorazepate (TRANXENE) 3.75 MG tablet [[id number]]   Have you contacted your pharmacy to request a refill? No   Which pharmacy would you like this sent to?  [facility] - [place], [place] - [address] [place] [address] Phone: [phone] Fax: [phone]    Patient notified that their request is being sent to the clinical staff for review and that they should receive a response within 2 business days.   Please advise at Mobile [phone] (mobile)

## 2022-08-16 NOTE — Telephone Encounter (Signed)
Patient advised of refill.  

## 2022-08-17 ENCOUNTER — Ambulatory Visit: Payer: Medicare HMO | Attending: Family Medicine | Admitting: Physical Therapy

## 2022-08-17 ENCOUNTER — Encounter: Payer: Self-pay | Admitting: Physical Therapy

## 2022-08-17 DIAGNOSIS — R2689 Other abnormalities of gait and mobility: Secondary | ICD-10-CM | POA: Diagnosis present

## 2022-08-17 DIAGNOSIS — R2681 Unsteadiness on feet: Secondary | ICD-10-CM | POA: Insufficient documentation

## 2022-08-17 DIAGNOSIS — M6281 Muscle weakness (generalized): Secondary | ICD-10-CM | POA: Insufficient documentation

## 2022-08-17 NOTE — Therapy (Addendum)
OUTPATIENT PHYSICAL THERAPY TREATMENT / DISCHARGE SUMMARY   Patient Name: [name] MRN: [id number] DOB:03-06-1927, 89 y.o., female Today's Date: 08/17/2022  END OF SESSION:  PT End of Session - 08/17/22 1102     Visit Number 7    Date for PT Re-Evaluation 09/14/22    Authorization Type [employer]/MCR    Progress Note Due on Visit 10    PT Start Time 1102    PT Stop Time 1145    PT Time Calculation (min) 43 min    Activity Tolerance Patient tolerated treatment well    Behavior During Therapy WFL for tasks assessed/performed                Past Medical History:  Diagnosis Date   Aortic valve sclerosis    CAD (coronary artery disease)    S/p DES to the proximal RCA, POBA to the distal RCA 8/08 //Myoview 2009 low risk // Myoview 9/22: EF 57, no ischemia or infarction, apical thinning artifact, low risk study   DUB (dysfunctional uterine bleeding)    Elevated cholesterol    Atorvastatin   GERD (gastroesophageal reflux disease)    History of echocardiogram    EF 60-65, GRII DD, GLS -18.4, normal RVSF, moderate MAC, moderate AV calcification, trivial AI   Hypertension    Plantar fasciitis    Shingles    Past Surgical History:  Procedure Laterality Date   APPENDECTOMY  2011   CARDIAC STINT IMPLANT  2008   CARPAL TUNNEL RELEASE     EYE SURGERY     INGUINAL HERNIA REPAIR     OOPHORECTOMY  2001   LAVH,BSO   ULNA NERVE RELEASE  2009   VAGINAL HYSTERECTOMY  2001   LAVH, BSO   Patient Active Problem List   Diagnosis Date Noted   High risk medication use 03/25/2022   Bilateral sensorineural hearing loss 12/29/2021   Anxiety 11/24/2021   Aortic valve sclerosis 02/24/2021   Varicose veins of both lower extremities 02/24/2021   Left knee pain 04/28/2015   Coronary artery disease involving native coronary artery of native heart without angina pectoris 11/22/2013   Essential hypertension 11/22/2013   Pure hypercholesterolemia 11/22/2013   Inversion, nipple 09/05/2013    Shingles    Elevated cholesterol    Cancer (HCC)    Right knee pain 05/10/2011    PCP: Clayborne Dana, NP  REFERRING PROVIDER: Clayborne Dana, NP  REFERRING DIAG: R26.89 (ICD-10-CM) - Balance problems   THERAPY DIAG:  Unsteadiness on feet  Muscle weakness (generalized)  Other abnormalities of gait and mobility  RATIONALE FOR EVALUATION AND TREATMENT: Rehabilitation  ONSET DATE: one year or more  NEXT MD VISIT: not scheduled yet   SUBJECTIVE:   SUBJECTIVE STATEMENT: Pt reports she had good days Sunday and Monday but is a little sore today.  PAIN:  Are you having pain? Yes: NPRS scale:  2/10 Pain location: L knee Pain description: "creaks"   Are you having pain? No - low back  PERTINENT HISTORY: Anxiety, CAD, HTN, B knee pain  PRECAUTIONS: None  WEIGHT BEARING RESTRICTIONS: No  FALLS:  Has patient fallen in last 6 months? No  LIVING ENVIRONMENT: Lives with: lives alone Lives in: House/apartment Stairs:  No, but a steep driveway (length only car and a half) Has following equipment at home: Single point cane, shower chair, and were her late husbands  OCCUPATION: retired  PLOF: Independent  PATIENT GOALS: walk and feel comfortable without fear   OBJECTIVE:  DIAGNOSTIC FINDINGS:   PATIENT SURVEYS:  ABC scale  980 / 1600 = 61.3 %  COGNITION: Overall cognitive status: Within functional limits for tasks assessed    MUSCLE LENGTH: Mild bil HS and piriformis R>L  POSTURE: rounded shoulders and forward head  PALPATION: NT  LOWER EXTREMITY MMT:   MMT Right eval Left eval  Hip flexion 4+ 4+  Hip extension 4- 4-  Hip abduction 4- 4-  Hip adduction 4 4  Knee flexion 4+ 4+  Knee extension 5 5  Ankle dorsiflexion 5 5   (Blank rows = not tested)  LOWER EXTREMITY ROM: WFL  LUMBAR ROM:  flex to shins, extension < 10 deg, bil rotation 25% bil a little unsteady  FUNCTIONAL TESTS:  5 times sit to stand: 21.4 sec  Berg Balance Scale: 42/56  Significant risk for falls Dynamic Gait Index: 18/24  07/28/22 MCTSIB: Condition 1: Avg of 3 trials: 30 sec, Condition 2: Avg of 3 trials: 30 sec, Condition 3: Avg of 3 trials: 30 sec, Condition 4: Avg of 3 trials: 30 sec, and Total Score: 120/120 Stairs: step to gait; able to do reciprocal but must pull up with B UE with L leg.   GAIT: Distance walked: 180 Assistive device utilized: Single point cane and None Level of assistance: Complete Independence Comments: without AD: decreased step length R, Wide BOS: with cane: improved gait pattern and patient confidence   TODAY'S TREATMENT:                                                                                                                              DATE:    08/17/22 THERAPEUTIC EXERCISE: to improve flexibility, strength and mobility.  Verbal and tactile cues throughout for technique.  NuStep - L5 x 6 min (UE/LE) - Seat #11/Handles #12 R/L Fwd step-up and step-down to/from 6" step x 5 each, single UE support on SPC R/L Fwd step-up and over 6" step x 5 each, single UE support on SPC Lateral step-up and step-down to/from 6" step x 10 bil, B UE support on window ledge Mini-squat with UE support on back of chair x 8 - arthritic grinding noted in L knee but discontinued d/t increased pain in R knee  GAIT TRAINING: To normalize gait pattern and improve safety with SPC . 270 ft with SPC on R - good sequencing with cane Provided instruction in sequencing of SPC with curb negotiation using aerobic step for practice  NEUROMUSCULAR RE-EDUCATION: To improve balance, coordination, and reduce fall risk. Alt toe clears to 6" step x 20, initially with single UE support on windowsill, progressing to no support Cone knock down and righting + side-step between cones x 10 each foot, UE support on [facility] Fwd  single step with cross-body reach to cone atop 36" FR x 10 bil   08/13/22 THERAPEUTIC EXERCISE: to improve flexibility, strength and mobility.   Verbal and tactile cues throughout for technique.  NuStep - L5  x 6 min (UE/LE) - Seat #11/Handles #12 Review of current HEP adjusting frequency and indicating priorities for HEP performance at home Standing RTB TB row 10 x 5" - cues for abdominal muscle engagement Standing RTB scap retraction + shoulder extension 10 x 5" - cues for abdominal muscle engagement and upright posture Standing RTB pallof press x 10 bil  NEUROMUSCULAR RE-EDUCATION: To improve posture, balance, proprioception, coordination, and reduce fall risk. Corner balance progression with 1/2 tandem stance on firm surface with arms crossed on chest (2 sets - 1 with each foot fwd; back of chair in front for safety)            Eyes open: - static stance x 30 sec - horiz head turns x 5 - vertical head nods x 5 - trunk rotation x 5 Eyes closed: - static stance x 10 sec Corner balance progression with narrow BOS on Airex pad with arms crossed on chest            Eyes open: - static stance x 30 sec - horiz head turns x 5 - vertical head nods x 5 - trunk rotation x 5 Eyes closed: - static stance x 10 sec Standing on Airex pad in corner with cross-body reach to PT's hands x 10   08/11/22 THERAPEUTIC EXERCISE: to improve flexibility, strength and mobility.  Verbal and tactile cues throughout for technique.  NuStep - L5 x 6 min (UE/LE) - Seat #11/Handles #12 Seated RTB TB row 10 x 5" Seated RTB scap retraction + shoulder extension 10 x 5" - pt with tendency to lean forward and round back Seated scapular resetting/retraction & depression with pool noodle along spine in chair 10 x 5" Seated RTB pallof press x 10 bil Fwd step-up and step-down to/from 6" step x 10, single UE support on window ledge Fwd step-up and over 6" step x 10, single UE support on window ledge Lateral step-up and step-down to/from 6" step x 10 bil, B UE support on window ledge   PATIENT EDUCATION:  Education details: HEP review and HEP  consolidation Person educated: Patient Education method: Explanation, Demonstration, and Handouts Education comprehension: verbalized understanding and returned demonstration  HOME EXERCISE PROGRAM: Access Code: [email or web address] URL: [email or web address] Date: 08/13/2022 Prepared by: Glenetta Hew  Exercises - Supine Bridge  - 2 x daily - 3 x weekly - 1-2 sets - 10 reps - Sit to Stand with Armchair  - 2 x daily - 7 x weekly - 1 sets - 5-10 reps - Seated Piriformis Stretch  - 1 x daily - 7 x weekly - 1 sets - 3 reps - 30-60 sec hold - Standing Hip Extension with Resistance at Ankles and Counter Support  - 1 x daily - 3 x weekly - 3 sets - 10 reps - Side Stepping with Resistance at Thighs and Counter Support  - 1 x daily - 3 x weekly - 3 sets - 10 reps - Standing Lumbar Extension at Wall - Forearms  - 2 x daily - 7 x weekly - 1 sets - 5 reps - Seated Hip Abduction with Resistance  - 1 x daily - 3 x weekly - 3 sets - 10 reps - Supine Lower Trunk Rotation  - 2 x daily - 7 x weekly - 2 sets - 10 reps - 5-10 sec hold - Corner Balance Feet Together With Eyes Open  - 1 x daily - 5 x weekly - 3 reps - 30 sec hold - Corner Balance Feet  Together: Eyes Open With Head Turns  - 1 x daily - 5 x weekly - 2 sets - 5 reps - Semi-Tandem Corner Balance With Eyes Open  - 1 x daily - 5 x weekly - 3 reps - 20-30 sec hold - Seated Scapular Resetting  - 1 x daily - 7 x weekly - 2 sets - 10 reps - 3 sec hold   ASSESSMENT:  CLINICAL IMPRESSION: Tau[place]ah still feels like her HEP has a lot but admits to completing all exercises every day rather than the reduced frequency discussed last visit, therefore reminded her of recommended frequency for various HEP exercises. She demonstrates good sequencing with SPC on level surfaces but was more uncertain with sequencing with cane for step/curb negotiation. Continue balance training, introducing dynamic stepping and weight shifting activities with UE and LE reaching - SBA  of PT provided for safety but no significant LOB observed. Good tolerance for most exercises but increased R knee pain noted with mini-squats - pt encouraged to notify PT in exercises increase her pain so that accommodations can be made.  OBJECTIVE IMPAIRMENTS: Abnormal gait, decreased balance, decreased strength, impaired flexibility, postural dysfunction, and pain.   ACTIVITY LIMITATIONS: stairs and locomotion level  PARTICIPATION LIMITATIONS: community activity  PERSONAL FACTORS: 3+ comorbidities: anxiety, LBP, B knee pain  are also affecting patient's functional outcome.   REHAB POTENTIAL: Excellent  CLINICAL DECISION MAKING: Evolving/moderate complexity  EVALUATION COMPLEXITY: Moderate   GOALS: Goals reviewed with patient? Yes  SHORT TERM GOALS: Target date: 08/03/2022   Patient will be independent with initial HEP. Baseline:  Goal status: MET  08/03/22  2.   Patient will be educated on strategies to decrease risk of falls.  Baseline:  Goal status: MET  08/03/22   LONG TERM GOALS: Target date: 09/14/2022  Patient will be independent with advanced/ongoing HEP to improve outcomes and carryover.  Baseline:  Goal status: IN PROGRESS  2.  Patient will be able to ambulate 600' with LRAD with good safety to access community.  Baseline:  Goal status: IN PROGRESS  08/17/22 - 270 ft with SPC with good sequencing/safety  3.  Patient will be able to step up/down curb safely with LRAD for safety with community ambulation.  Baseline:  Goal status: IN PROGRESS  08/17/22 - pt continues to require UE support for curb negotiation and is uncertain with sequencing of SPC when navigating curb  4.  Patient will demonstrate improved functional LE strength as demonstrated by 5 sec decrease in 5x sit to stand time. Baseline: 21.4 sec Goal status: IN PROGRESS  5.  Patient will demonstrate at least 19/24 on DGI to improve gait stability and reduce risk for falls. Baseline: 18/24 on 07/28/22 Goal  status: IN PROGRESS  6.  Patient will score 50/56 on Berg Balance test to demonstrate lower risk of falls. (MCID= 8 points) .  Baseline: 42/56 Goal status: IN PROGRESS  7.  Patient will report >= 999 on ABC scale to demonstrate improved functional ability. Baseline:  980 / 1600 = 61.3 % Goal status: IN PROGRESS  8.  Patient will demonstrate improved B hip ABD and ext strength to 4+/5 or better to improve function. Baseline:  Goal status: IN PROGRESS   PLAN:  PT FREQUENCY: 2x/week  PT DURATION: 8 weeks  PLANNED INTERVENTIONS: Therapeutic exercises, Therapeutic activity, Neuromuscular re-education, Balance training, Gait training, Patient/Family education, Self Care, Joint mobilization, Stair training, Dry Needling, Electrical stimulation, Spinal mobilization, Cryotherapy, Moist heat, and Manual therapy  PLAN FOR NEXT  SESSION: work on gait with SPC, esp with curb negotiation; step ups/downs and standing hip ABD/ext strengthening; NMR/balance training   Marry Guan, PT 08/17/2022, 12:12 PM   PHYSICAL THERAPY DISCHARGE SUMMARY  Visits from Start of Care: 7  Current functional level related to goals / functional outcomes: Refer to above clinical impression and goal assessment for status as of last visit on 08/17/22. Patient cancelled her last several appointments due to diagnosis of shingles, and has not returned to PT in >30 days, therefore will proceed with discharge from PT for this episode.     Remaining deficits: As above. Unable to formally assess status at discharge due to failure to return to PT.    Education / Equipment: HEP   Patient agrees to discharge. Patient goals were partially met. Patient is being discharged due to not returning since the last visit.  Marry Guan, PT 10/25/22, 5:54 PM  [facility] [address]  [address] [place], [place], [address] Phone: [phone]   Fax:  [phone]

## 2022-08-18 ENCOUNTER — Encounter: Payer: Self-pay | Admitting: Family Medicine

## 2022-08-18 ENCOUNTER — Ambulatory Visit (INDEPENDENT_AMBULATORY_CARE_PROVIDER_SITE_OTHER): Payer: Medicare HMO | Admitting: Family Medicine

## 2022-08-18 VITALS — BP 136/55 | HR 73 | Ht 64.0 in | Wt 155.0 lb

## 2022-08-18 DIAGNOSIS — B029 Zoster without complications: Secondary | ICD-10-CM | POA: Diagnosis not present

## 2022-08-18 MED ORDER — VALACYCLOVIR HCL 1 G PO TABS: 1000.0000 mg | ORAL_TABLET | Freq: Two times a day (BID) | ORAL | 0 refills | Status: AC

## 2022-08-18 NOTE — Progress Notes (Signed)
Acute Office Visit  Subjective:     Patient ID: [name], female    DOB: 1926-07-26, 89 y.o.   MRN: [id number]  Chief Complaint  Patient presents with   Skin Problem    HPI Patient is in today for rash.  Discussed the use of AI scribe software for clinical note transcription with the patient, who gave verbal consent to proceed.  History of Present Illness   The patient presents with a painful rash that developed several days ago along their left bra line. Initially attributed to irritation from the bra, the rash has since evolved to become red and blistered. The patient describes the pain as 'achy,' rating it a 5-6/10. The rash is sensitive to touch, but not tingly. There has been no drainage from the blisters and the rash has not spread. The patient also reports some itchiness. They deny any other new symptoms, changes in soaps, laundry detergent, diet, or medications. The patient has been attending physical therapy, which has resulted in some body aches.          ROS All review of systems negative except what is listed in the HPI      Objective:    BP (!) 136/55   Pulse 73   Ht 5\' 4"  (1.626 m)   Wt 155 lb (70.3 kg)   LMP  (LMP Unknown)   SpO2 100%   BMI 26.61 kg/m    Physical Exam Vitals reviewed.  Constitutional:      Appearance: Normal appearance.  Pulmonary:     Effort: Pulmonary effort is normal.  Skin:    General: Skin is warm and dry.     Findings: Rash present.     Comments: Left lateral rib ares under bra line with erythematous rash and vesicular clusters, very sensitive to light palpation   Neurological:     General: No focal deficit present.     Mental Status: She is alert and oriented to person, place, and time. Mental status is at baseline.  Psychiatric:        Mood and Affect: Mood normal.        Behavior: Behavior normal.        Thought Content: Thought content normal.        Judgment: Judgment normal.     No results found for  any visits on 08/18/22.      Assessment & Plan:   Problem List Items Addressed This Visit     Shingles - Primary   Relevant Medications   valACYclovir (VALTREX) 1000 MG tablet      Rash - Suspected Shingles: Painful, red, blistering rash along the left bra line. No systemic symptoms. Patient has received the older shingles vaccine. -Start Valtrex. -Apply hydrocortisone cream or calamine lotion for symptom relief. -Avoid scratching or picking at the rash to prevent secondary infection. -Consider Vaseline gauze to protect the area, especially during physical therapy. -Once healed, consider receiving the newer shingles vaccine. -If pain becomes unbearable, contact the office for additional pain management options.     CrCl ~45, therefore Valtrex 1000 mg BID for 7 days     Meds ordered this encounter  Medications   valACYclovir (VALTREX) 1000 MG tablet    Sig: Take 1 tablet (1,000 mg total) by mouth 2 (two) times daily for 7 days.    Dispense:  14 tablet    Refill:  0    Order Specific Question:   Supervising Provider    Answer:  BLYTH, STACEY A [[id number]]    Return if symptoms worsen or fail to improve.  Clayborne Dana, NP

## 2022-08-18 NOTE — Patient Instructions (Signed)
Valtrex twice a day for 7 days Can use hydrocortisone cream, calamine lotion, Vaseline gauze, tylenol, etc for discomfort.   Please contact office for follow-up if symptoms do not improve or worsen. Seek emergency care if symptoms become severe.

## 2022-08-19 ENCOUNTER — Ambulatory Visit: Payer: Medicare HMO

## 2022-08-19 ENCOUNTER — Encounter: Payer: Self-pay | Admitting: Family Medicine

## 2022-08-19 DIAGNOSIS — B029 Zoster without complications: Secondary | ICD-10-CM

## 2022-08-19 MED ORDER — PREGABALIN 50 MG PO CAPS: 50.0000 mg | ORAL_CAPSULE | Freq: Three times a day (TID) | ORAL | 0 refills | Status: DC

## 2022-08-20 ENCOUNTER — Telehealth: Payer: Self-pay

## 2022-08-20 DIAGNOSIS — B029 Zoster without complications: Secondary | ICD-10-CM

## 2022-08-20 MED ORDER — GABAPENTIN 300 MG PO CAPS: 300.0000 mg | ORAL_CAPSULE | Freq: Three times a day (TID) | ORAL | 1 refills | Status: DC

## 2022-08-20 NOTE — Telephone Encounter (Signed)
Called patient and made her aware. She states she would prefer lyrica and will pay out of pocket. I advised her she could do that, but do not take Lyrica and Gabapentin together. She expressed understanding.

## 2022-08-20 NOTE — Telephone Encounter (Signed)
Lyrica not covered, changing to gabapentin.

## 2022-08-20 NOTE — Telephone Encounter (Signed)
Lyrica is not covered by [employer]. Requires trial of gabapentin first. Please advise?

## 2022-08-24 ENCOUNTER — Encounter: Payer: Medicare HMO | Admitting: Physical Therapy

## 2022-09-01 ENCOUNTER — Ambulatory Visit: Payer: Medicare HMO

## 2022-09-29 IMAGING — MG MM DIGITAL SCREENING BILAT W/ TOMO AND CAD
8 series · 8 of 24 positions shown · non-contrast
Comparison: Previous exam(s).

CLINICAL DATA: Screening.

EXAM:
DIGITAL SCREENING BILATERAL MAMMOGRAM WITH TOMOSYNTHESIS AND CAD
TECHNIQUE: Bilateral screening digital craniocaudal and mediolateral oblique
mammograms were obtained. Bilateral screening digital breast
tomosynthesis was performed. The images were evaluated with
computer-aided detection.

[L MLO synth-2D]
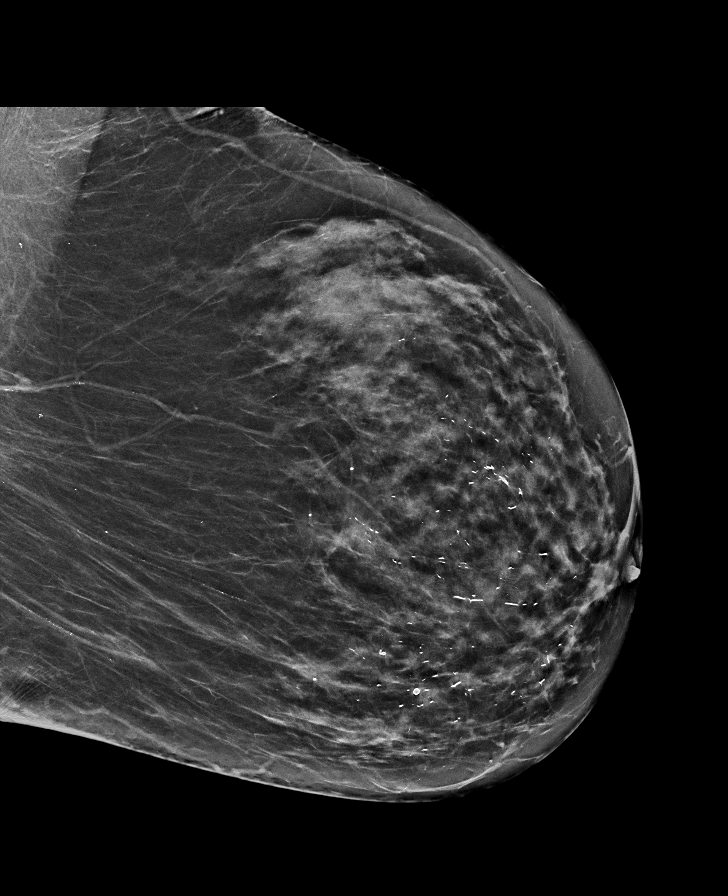

[L CC synth-2D]
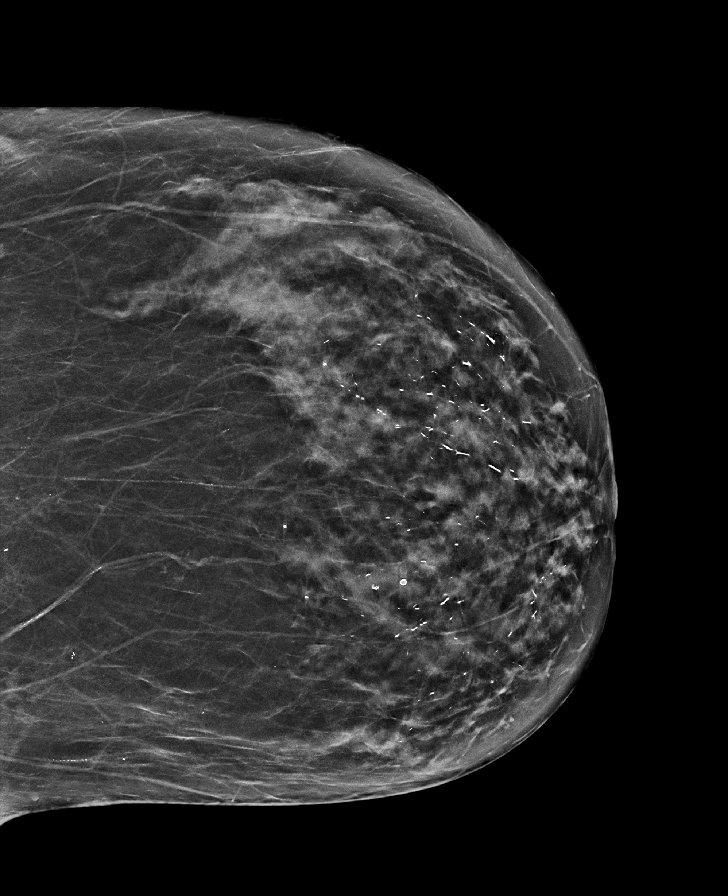

[R MLO synth-2D]
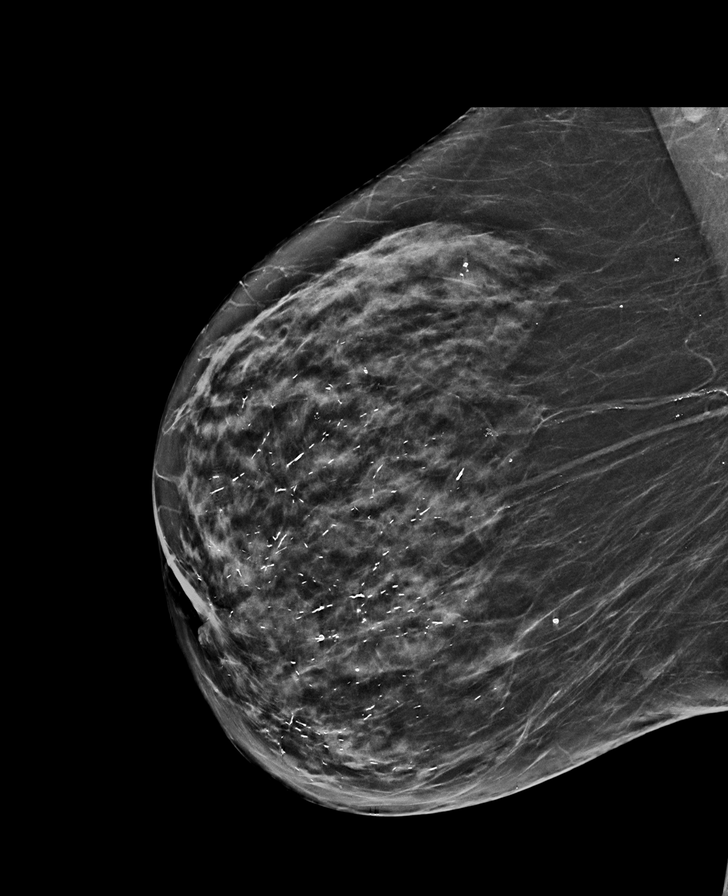

[R CC synth-2D]
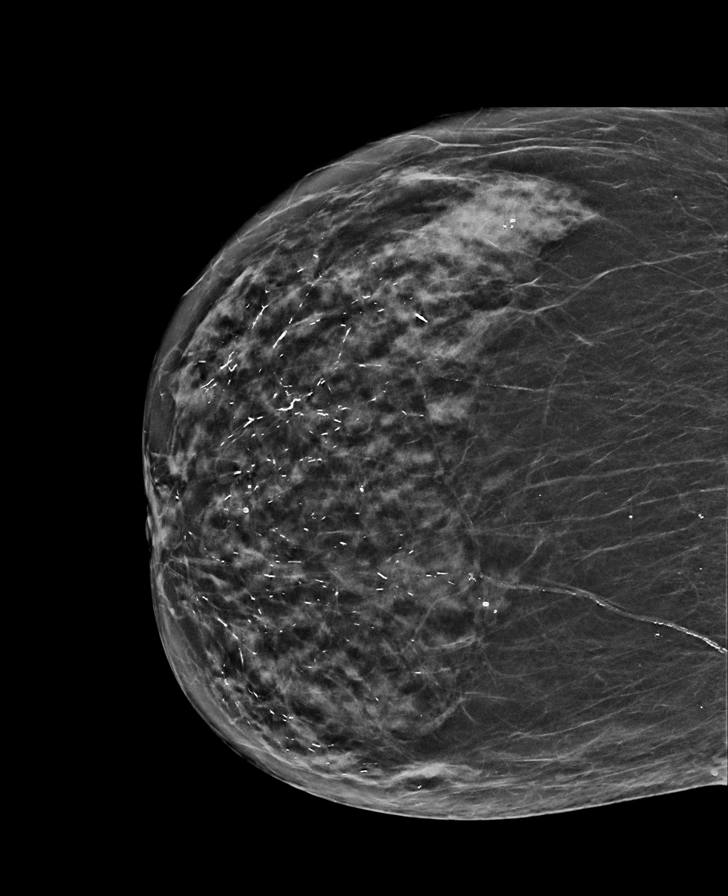

[R MLO tomo · tomo slice 32/63.0]
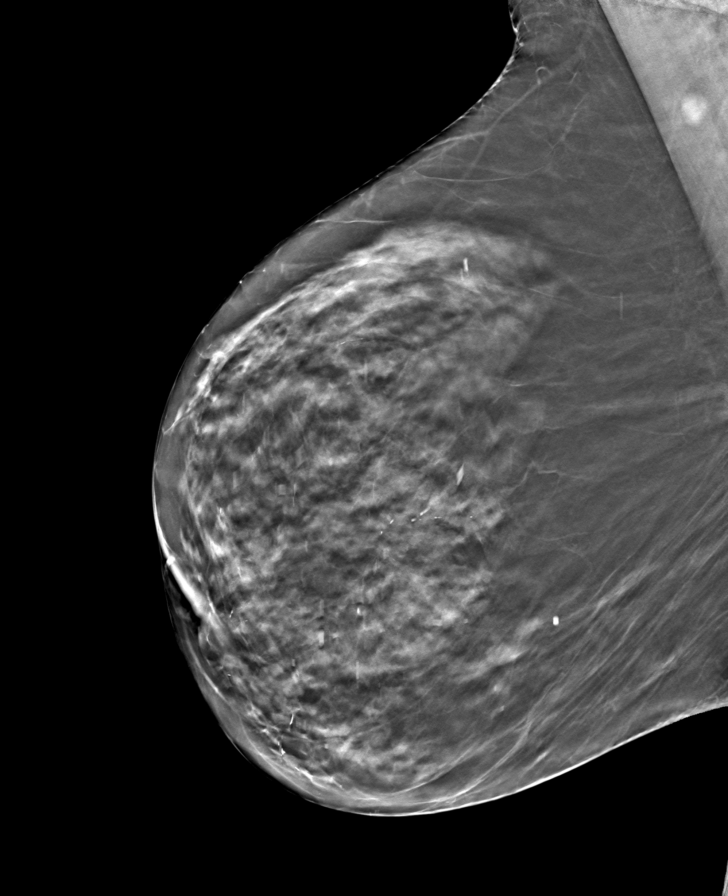

[L MLO tomo · tomo slice 33/66.0]
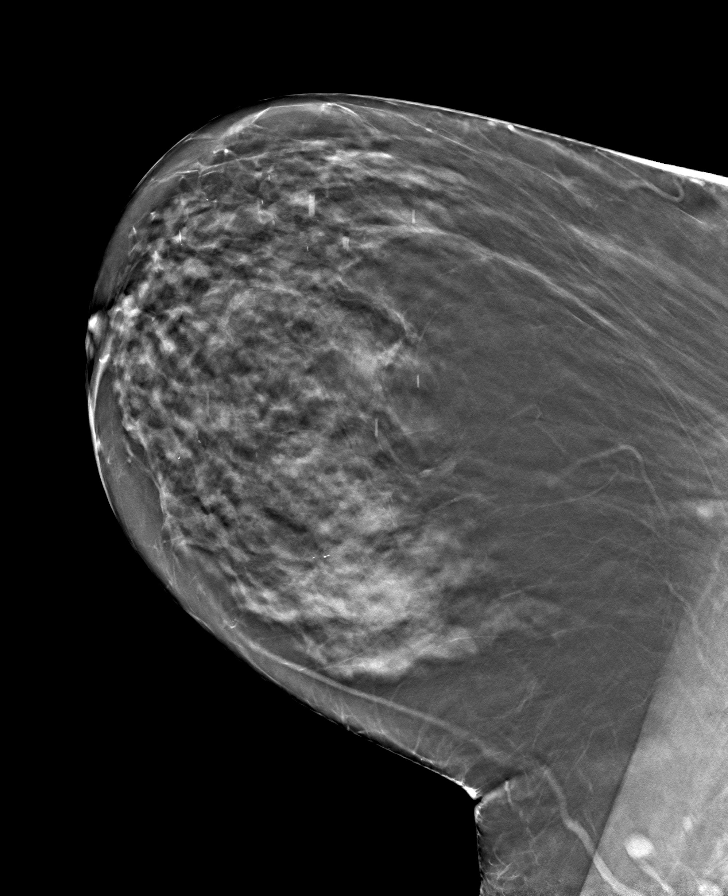

[L CC tomo · tomo slice 31/62.0]
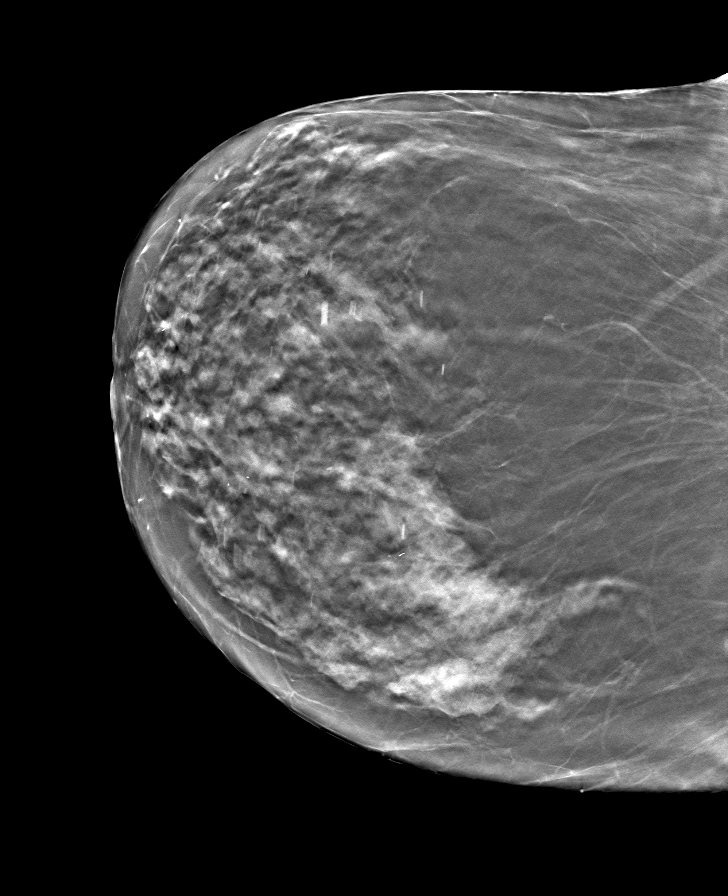

[R CC tomo · tomo slice 30/59.0]
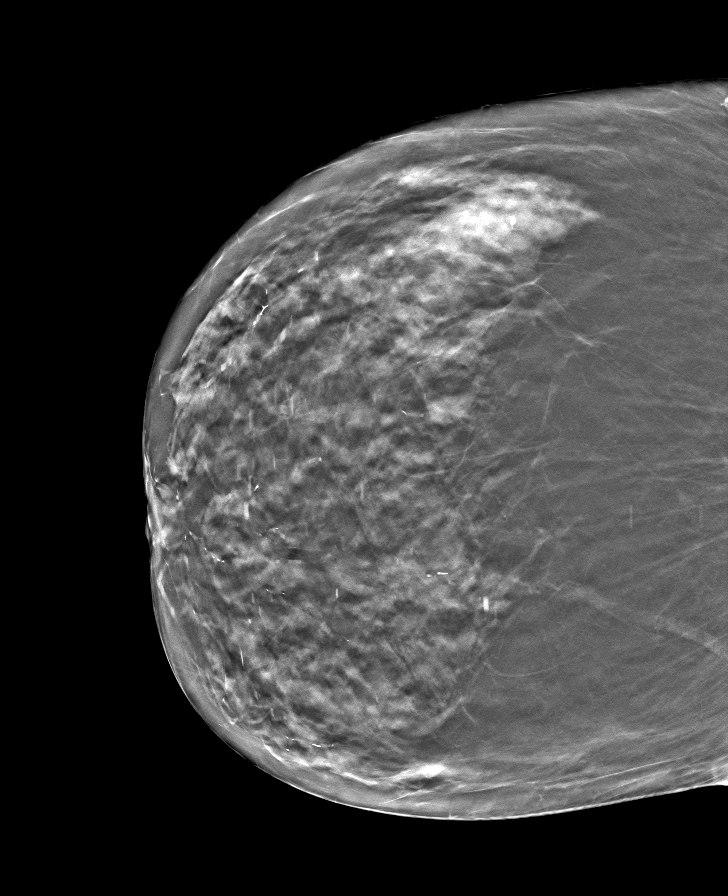

[8 of 24 positions shown; findings below may reference images not displayed]

ACR Breast Density Category c: The breast tissue is heterogeneously
dense, which may obscure small masses.
FINDINGS: There are no findings suspicious for malignancy.
IMPRESSION: No mammographic evidence of malignancy. A result letter of this
screening mammogram will be mailed directly to the patient.

RECOMMENDATION:
Screening mammogram in one year. (Code:[id number])

BI-RADS CATEGORY  1: Negative.

## 2022-10-01 ENCOUNTER — Other Ambulatory Visit: Payer: Self-pay | Admitting: Family Medicine

## 2022-10-01 DIAGNOSIS — I10 Essential (primary) hypertension: Secondary | ICD-10-CM

## 2022-10-07 ENCOUNTER — Ambulatory Visit (INDEPENDENT_AMBULATORY_CARE_PROVIDER_SITE_OTHER): Payer: Medicare HMO | Admitting: Podiatry

## 2022-10-07 ENCOUNTER — Ambulatory Visit: Payer: Medicare HMO

## 2022-10-07 DIAGNOSIS — L84 Corns and callosities: Secondary | ICD-10-CM

## 2022-10-07 DIAGNOSIS — M722 Plantar fascial fibromatosis: Secondary | ICD-10-CM | POA: Diagnosis not present

## 2022-10-07 DIAGNOSIS — M79672 Pain in left foot: Secondary | ICD-10-CM

## 2022-10-07 DIAGNOSIS — M7752 Other enthesopathy of left foot: Secondary | ICD-10-CM | POA: Diagnosis not present

## 2022-10-07 NOTE — Progress Notes (Signed)
Chief Complaint  Patient presents with   Callouses    Patient came in today for right 5th toe corn, left heel pain, started 2 weeks ago, rate of pain 10 out of 10 in the mornings, X-Rays done today    HPI: 89 y.o. female presenting today with c/o pain in the bottom of the left heel.  Denies trauma.  Notes that she is going on a bus trip soon and will be doing a lot of walking.  She does have good supportive sneakers.  Also notes a painful corn on the medial aspect of the fifth toe right foot.  Past Medical History:  Diagnosis Date   Aortic valve sclerosis    CAD (coronary artery disease)    S/p DES to the proximal RCA, POBA to the distal RCA 8/08 //Myoview 2009 low risk // Myoview 9/22: EF 57, no ischemia or infarction, apical thinning artifact, low risk study   DUB (dysfunctional uterine bleeding)    Elevated cholesterol    Atorvastatin   GERD (gastroesophageal reflux disease)    History of echocardiogram    EF 60-65, GRII DD, GLS -18.4, normal RVSF, moderate MAC, moderate AV calcification, trivial AI   Hypertension    Plantar fasciitis    Shingles     Past Surgical History:  Procedure Laterality Date   APPENDECTOMY  2011   CARDIAC STINT IMPLANT  2008   CARPAL TUNNEL RELEASE     EYE SURGERY     INGUINAL HERNIA REPAIR     OOPHORECTOMY  2001   LAVH,BSO   ULNA NERVE RELEASE  2009   VAGINAL HYSTERECTOMY  2001   LAVH, BSO    No Known Allergies   Physical Exam: General: The patient is alert and oriented x3 in no acute distress.  Dermatology:  No ecchymosis, erythema, or edema bilateral.  There is a hyperkeratotic lesion on the medial aspect of the fifth toe DIPJ and lateral aspect of the right fourth toe PIPJ, consistent with kissing corns.  No ulceration is noted.  There is pain on palpation of the lesions.  Vascular: Palpable pedal pulses bilaterally. Capillary refill within normal limits.    Neurological: Light touch sensation intact bilateral.  MMT 5/5 to lower  extremity bilateral. Negative Tinel's sign with percussion of the posterior tibial nerve on the affected extremity.    Musculoskeletal Exam:  There is significant pain on palpation of the plantarmedial & plantarcentral aspect of left heel.  Mild localized edema to the plantar aspect of the heel.  No gaps or nodules within the plantar fascia.  Positive Windlass mechanism bilateral.  Antalgic gait noted with first few steps upon standing.  No pain on palpation of achilles tendon bilateral.  Ankle df less than 10 degrees with knee extended b/l.  Radiographic Exam (left foot, 3 weightbearing views, 10/07/2022):  Decreased osseous mineralization. increased calcaneal inclination angle on lateral view.  Inferior calcaneal spur present.  No fracture noted  Assessment/Plan of Care: 1. Plantar fascial fibromatosis   2. Left foot pain   3. Corns and callosities   4. Bursitis of heel, left     -Reviewed etiology of plantar fasciitis with patient.  Discussed treatment options with patient today, including cortisone injection, NSAID course of treatment, stretching exercises, physical therapy, use of night splint, rest, icing the heel, arch supports/orthotics, and supportive shoe gear.    Corticosteroid injection was administered to the plantar aspect of the left heel with the patient's consent.  This consisted of  a mixture of 1% Xylocaine plain, 0.5% Sensorcaine plain, and Kenalog 10 for total of 1.25 cc.  She tolerated this well and a Band-Aid was applied.  She can perform Stretching exercises starting 2 days from now.  Will hold off on some of the other typical treatments for plantar fasciitis since it so inflamed and tender today.  Will see if we can get the pain to improve before incorporating arch supports or night splints which may aggravate it while the bursal sac is so inflamed.  The kissing corns were shaved with a sterile #312 blade uneventfully.  Dispensed a Silipos toe cap to wear while on her bus  trip.  Return in about 3 weeks (around 10/28/2022) for f/u plantar fasciitis L foot and RFC w/ Dr. Al Corpus.   Clerance Lav, DPM, FACFAS [facility]     [address] [place], [place] [address]                Office [phone]  Fax [phone]

## 2022-10-09 DIAGNOSIS — M7752 Other enthesopathy of left foot: Secondary | ICD-10-CM | POA: Diagnosis not present

## 2022-10-09 DIAGNOSIS — M722 Plantar fascial fibromatosis: Secondary | ICD-10-CM

## 2022-10-09 MED ORDER — TRIAMCINOLONE ACETONIDE 10 MG/ML IJ SUSP
10.0000 mg | Freq: Once | INTRAMUSCULAR | Status: AC
  Administered 2022-10-09: 10 mg via INTRAMUSCULAR

## 2022-11-02 ENCOUNTER — Encounter: Payer: Self-pay | Admitting: Podiatry

## 2022-11-02 ENCOUNTER — Ambulatory Visit (INDEPENDENT_AMBULATORY_CARE_PROVIDER_SITE_OTHER): Payer: Medicare HMO | Admitting: Podiatry

## 2022-11-02 DIAGNOSIS — D2371 Other benign neoplasm of skin of right lower limb, including hip: Secondary | ICD-10-CM

## 2022-11-02 DIAGNOSIS — M79676 Pain in unspecified toe(s): Secondary | ICD-10-CM | POA: Diagnosis not present

## 2022-11-02 DIAGNOSIS — B351 Tinea unguium: Secondary | ICD-10-CM

## 2022-11-02 NOTE — Progress Notes (Signed)
She presents today chief complaint of painful elongated toenails and painful callus.  States that his heel is still hurting but I think is too soon to get another injection.  Objective: Vital signs are stable she is alert and oriented x 3.  Pulses are palpable.  Toenails are long thick yellow dystrophic onychomycotic painful on palpation with sharp incurvated nail margins.  There is no erythema Dem salines drainage or odor she does have tenderness on palpation of the left heel.  Assessment: Plantar fasciitis of the left heel slowly resolving.  Pain limb secondary to onychomycosis.  Plan: Debridement of toenails 1 through 5 bilaterally.  And she will follow-up with Dr. Haynes Hoehn for another injection in 6 weeks

## 2022-11-11 ENCOUNTER — Encounter: Payer: Self-pay | Admitting: Family Medicine

## 2022-11-11 DIAGNOSIS — F419 Anxiety disorder, unspecified: Secondary | ICD-10-CM

## 2022-11-11 NOTE — Telephone Encounter (Signed)
Please sign if appropriate

## 2022-11-16 MED ORDER — CLORAZEPATE DIPOTASSIUM 3.75 MG PO TABS: 3.7500 mg | ORAL_TABLET | Freq: Two times a day (BID) | ORAL | 0 refills | Status: DC | PRN

## 2022-11-23 ENCOUNTER — Telehealth: Payer: Self-pay | Admitting: Cardiology

## 2022-11-23 NOTE — Telephone Encounter (Signed)
  Pt c/o Shortness Of Breath: STAT if SOB developed within the last 24 hours or pt is noticeably SOB on the phone  1. Are you currently SOB (can you hear that pt is SOB on the phone)? Not right now  2. How long have you been experiencing SOB? A week  3. Are you SOB when sitting or when up moving around? The slightest moving around  4. Are you currently experiencing any other symptoms?  Loss of appetite but no other symtoms

## 2022-11-23 NOTE — Telephone Encounter (Signed)
Spoke with patient and  she states she gets SOB really quick. She can barely walk to the mailbox. She states its been happening for a while but is getting worse and she can no longer ignore it Denies chest pain but does have swelling in her ankles. BP today was 160/90 hr. Appointment scheduled for 9/12.

## 2022-11-25 ENCOUNTER — Ambulatory Visit: Payer: Medicare HMO | Attending: Cardiology | Admitting: Cardiology

## 2022-11-25 ENCOUNTER — Encounter: Payer: Self-pay | Admitting: *Deleted

## 2022-11-25 ENCOUNTER — Encounter: Payer: Self-pay | Admitting: Cardiology

## 2022-11-25 VITALS — BP 138/66 | HR 69 | Ht 64.0 in | Wt 157.0 lb

## 2022-11-25 DIAGNOSIS — R0602 Shortness of breath: Secondary | ICD-10-CM | POA: Diagnosis not present

## 2022-11-25 DIAGNOSIS — I10 Essential (primary) hypertension: Secondary | ICD-10-CM | POA: Diagnosis not present

## 2022-11-25 DIAGNOSIS — I358 Other nonrheumatic aortic valve disorders: Secondary | ICD-10-CM

## 2022-11-25 NOTE — Patient Instructions (Addendum)
Medication Instructions:  Please discontinue your Protonix. Continue all other medications as listed.  *If you need a refill on your cardiac medications before your next appointment, please call your pharmacy*  Testing/Procedures: Your physician has requested that you have a lexiscan myoview. For further information please visit [email or web address] Please follow instruction sheet, as given.  Follow-Up: At [facility], you and your health needs are our priority.  As part of our continuing mission to provide you with exceptional heart care, we have created designated Provider Care Teams.  These Care Teams include your primary Cardiologist (physician) and Advanced Practice Providers (APPs -  Physician Assistants and Nurse Practitioners) who all work together to provide you with the care you need, when you need it.  We recommend signing up for the patient portal called "MyChart".  Sign up information is provided on this After Visit Summary.  MyChart is used to connect with patients for Virtual Visits (Telemedicine).  Patients are able to view lab/test results, encounter notes, upcoming appointments, etc.  Non-urgent messages can be sent to your provider as well.   To learn more about what you can do with MyChart, go to [email or web address].    Your next appointment:   1 year(s)  Provider:   Donato Schultz, MD

## 2022-11-25 NOTE — Progress Notes (Signed)
Cardiology Office Note:  .   Date:  11/25/2022  ID:  [name], DOB [birth year], MRN [id number] PCP: Clayborne Dana, NP  [facility] Providers Cardiologist:  Donato Schultz, MD    History of Present Illness: Marland Kitchen   [name] is a 89 y.o. female Discussed the use of AI scribe software for clinical note transcription with the patient, who gave verbal consent to proceed.  History of Present Illness   The patient, with a history of coronary artery disease and a stent placement in the right coronary artery in 2008, presents with new onset shortness of breath with minimal exertion, such as dressing, for the past two weeks. He also reports long-standing leg weakness, which has limited his mobility. He has occasional symptoms of heaviness and throat discomfort, but these are not significant.  The patient's last echocardiogram in 2023 showed a normal ejection fraction of 60% and no significant valvular issues. His LDL was 65 in 2024 on atorvastatin 40 mg daily. He also takes amlodipine 5 mg and lisinopril 40 mg daily for blood pressure control.  The patient also reports chronic leg weakness, but arterial Dopplers were normal. He also mentions significant stress, to the point of teeth clenching, and severe reflux, currently managed with Protonix.         Studies Reviewed: Marland Kitchen   EKG Interpretation Date/Time:  Thursday November 25 2022 10:43:13 EDT Ventricular Rate:  65 PR Interval:  204 QRS Duration:  90 QT Interval:  378 QTC Calculation: 393 R Axis:   113  Text Interpretation: Normal sinus rhythm Left posterior fascicular block When compared with ECG of 27-Feb-2012 10:17, Left posterior fascicular block is now Present T wave inversion now evident in Inferior leads Confirmed by Donato Schultz ([id number]) on 11/25/2022 11:12:53 AM    LABS LDL: 65 (2024)  DIAGNOSTIC Stress test: No ischemia, normal pump function (2009) Echocardiogram: 60% EF, no aortic stenosis, no significant valvular  issues (2023) EKG: Normal (2023) Arterial Dopplers: Normal  Risk Assessment/Calculations:            Physical Exam:   VS:  BP 138/66   Pulse 69   Ht 5\' 4"  (1.626 m)   Wt 157 lb (71.2 kg)   LMP  (LMP Unknown)   SpO2 96%   BMI 26.95 kg/m    Wt Readings from Last 3 Encounters:  11/25/22 157 lb (71.2 kg)  08/18/22 155 lb (70.3 kg)  06/29/22 154 lb (69.9 kg)    GEN: Well nourished, well developed in no acute distress NECK: No JVD; No carotid bruits CARDIAC: RRR, soft systolic murmur, rubs, gallops RESPIRATORY:  Mild crackles clearing with deep breath ABDOMEN: Soft, non-tender, non-distended EXTREMITIES:  No edema; No deformity   ASSESSMENT AND PLAN: .   Assessment and Plan    Coronary Artery Disease Stable with stent placement in 2008 and normal stress test in 2009. Recent onset of dyspnea with exertion over the past two weeks. No significant changes in EKG or echocardiogram from last year. -Order stress test to evaluate for ischemia. -Continue current medications:Atorvastatin 40mg  daily, Amlodipine 5mg  daily, Lisinopril 40mg  daily.  Leg Weakness Chronic issue, no improvement despite normal arterial Dopplers. -Encourage continued mobility and exercise. -Consider referral to primary care or appropriate specialist for further evaluation if stress test is normal.  Gastroesophageal Reflux Disease Complaints of severe reflux despite Protonix. -Recommend consultation with a gastroenterologist for further management.          Informed Consent   Shared Decision  Making/Informed Consent The risks [chest pain, shortness of breath, cardiac arrhythmias, dizziness, blood pressure fluctuations, myocardial infarction, stroke/transient ischemic attack, nausea, vomiting, allergic reaction, radiation exposure, metallic taste sensation and life-threatening complications (estimated to be 1 in 10,000)], benefits (risk stratification, diagnosing coronary artery disease, treatment guidance) and  alternatives of a nuclear stress test were discussed in detail with [name] and she agrees to proceed.     Dispo: 1 yr  Signed, Donato Schultz, MD

## 2022-11-29 ENCOUNTER — Encounter: Payer: Self-pay | Admitting: Cardiology

## 2022-11-30 ENCOUNTER — Other Ambulatory Visit (HOSPITAL_BASED_OUTPATIENT_CLINIC_OR_DEPARTMENT_OTHER): Payer: Self-pay | Admitting: Family Medicine

## 2022-11-30 DIAGNOSIS — Z1231 Encounter for screening mammogram for malignant neoplasm of breast: Secondary | ICD-10-CM

## 2022-12-01 ENCOUNTER — Telehealth: Payer: Self-pay | Admitting: Family Medicine

## 2022-12-01 DIAGNOSIS — R2689 Other abnormalities of gait and mobility: Secondary | ICD-10-CM

## 2022-12-01 DIAGNOSIS — R531 Weakness: Secondary | ICD-10-CM

## 2022-12-01 NOTE — Telephone Encounter (Signed)
Referral pended.  Sign if appropriate.

## 2022-12-01 NOTE — Telephone Encounter (Signed)
Patient states would like another PT referral sent as hers expired.  She would like it for balance and strength.

## 2022-12-03 ENCOUNTER — Encounter (HOSPITAL_COMMUNITY): Payer: Medicare HMO

## 2022-12-07 ENCOUNTER — Inpatient Hospital Stay (HOSPITAL_BASED_OUTPATIENT_CLINIC_OR_DEPARTMENT_OTHER): Admission: RE | Admit: 2022-12-07 | Payer: Medicare HMO | Source: Ambulatory Visit

## 2022-12-10 NOTE — Therapy (Signed)
OUTPATIENT PHYSICAL THERAPY EVALUATION   Patient Name: [name] MRN: [id number] DOB:1927/03/08, 89 y.o., female Today's Date: 12/10/2022  END OF SESSION:   Past Medical History:  Diagnosis Date   Aortic valve sclerosis    CAD (coronary artery disease)    S/p DES to the proximal RCA, POBA to the distal RCA 8/08 //Myoview 2009 low risk // Myoview 9/22: EF 57, no ischemia or infarction, apical thinning artifact, low risk study   DUB (dysfunctional uterine bleeding)    Elevated cholesterol    Atorvastatin   GERD (gastroesophageal reflux disease)    History of echocardiogram    EF 60-65, GRII DD, GLS -18.4, normal RVSF, moderate MAC, moderate AV calcification, trivial AI   Hypertension    Plantar fasciitis    Shingles    Past Surgical History:  Procedure Laterality Date   APPENDECTOMY  2011   CARDIAC STINT IMPLANT  2008   CARPAL TUNNEL RELEASE     EYE SURGERY     INGUINAL HERNIA REPAIR     OOPHORECTOMY  2001   LAVH,BSO   ULNA NERVE RELEASE  2009   VAGINAL HYSTERECTOMY  2001   LAVH, BSO   Patient Active Problem List   Diagnosis Date Noted   High risk medication use 03/25/2022   Bilateral sensorineural hearing loss 12/29/2021   Anxiety 11/24/2021   Aortic valve sclerosis 02/24/2021   Varicose veins of both lower extremities 02/24/2021   Left knee pain 04/28/2015   Coronary artery disease involving native coronary artery of native heart without angina pectoris 11/22/2013   Essential hypertension 11/22/2013   Pure hypercholesterolemia 11/22/2013   Inversion, nipple 09/05/2013   Shingles    Elevated cholesterol    Cancer (HCC)    Right knee pain 05/10/2011    PCP: Clayborne Dana, NP   REFERRING PROVIDER: Clayborne Dana, NP   REFERRING DIAG:  R26.89 (ICD-10-CM) - Balance problems  R53.1 (ICD-10-CM) - Weakness   THERAPY DIAG:  No diagnosis found.  RATIONALE FOR EVALUATION AND TREATMENT: Rehabilitation  ONSET DATE: ***>1 year  NEXT MD VISIT:  02/17/23   SUBJECTIVE:   SUBJECTIVE STATEMENT: ***  PAIN: Are you having pain? {OPRCPAIN:27236}  PERTINENT HISTORY: ***Anxiety, CAD, HTN, LBP, B knee pain, GERD, shingles (June 2024), plantar fasciitis (July/Aug 2024)  PRECAUTIONS: {Therapy precautions:24002}  HAND DOMINANCE: {Hand Dominance:29389}  RED FLAGS: {PT Red Flags:29287}  WEIGHT BEARING RESTRICTIONS: {Yes ***/No:24003}  FALLS:  Has patient fallen in last 6 months? {fallsyesno:27318}  LIVING ENVIRONMENT: *** Lives with: lives alone Lives in: House/apartment Stairs: No, but a steep driveway (length only car and a half) Has following equipment at home: Single point cane and shower chair (belonged to her late husband)  OCCUPATION: Retired  PLOF: Independent and Leisure: ***  PATIENT GOALS: ***" Walk and feel comfortable without fear."   OBJECTIVE: (objective measures completed at initial evaluation unless otherwise dated)  DIAGNOSTIC FINDINGS:  ***  PATIENT SURVEYS:  {rehab surveys:24030}  ABC scale as of 07/20/2022 PT eval: 61.3%  COGNITION: Overall cognitive status: {cognition:24006}     SENSATION: {sensation:27233}  EDEMA:  {edema:24020}   POSTURE:  {posture:25561}  PALPATION: ***  JOINT MOBILITY TESTING:  ***  CERVICAL ROM:   {AROM/PROM:27142} ROM Eval  Flexion   Extension   Right lateral flexion   Left lateral flexion   Right rotation   Left rotation    (Blank rows = not tested)  CERVICAL SPECIAL TESTS:  {Cervical special tests:25246}  UPPER EXTREMITY ROM:   {AROM/PROM:27142} ROM  Right eval Left eval  Shoulder flexion    Shoulder extension    Shoulder abduction    Shoulder adduction    Shoulder internal rotation    Shoulder external rotation    Elbow flexion    Elbow extension    Wrist flexion    Wrist extension    Wrist ulnar deviation    Wrist radial deviation    Wrist pronation    Wrist supination    (Blank rows = not tested)  UPPER EXTREMITY MMT:  MMT  Right eval Left eval  Shoulder flexion    Shoulder extension    Shoulder abduction    Shoulder adduction    Shoulder internal rotation    Shoulder external rotation    Middle trapezius    Lower trapezius    Elbow flexion    Elbow extension    Wrist flexion    Wrist extension    Wrist ulnar deviation    Wrist radial deviation    Wrist pronation    Wrist supination    Grip strength (lbs)    (Blank rows = not tested)  SHOULDER SPECIAL TESTS: Impingement tests: {shoulder impingement test:25231:a} SLAP lesions: {SLAP lesions:25232} Instability tests: {shoulder instability test:25233} Rotator cuff assessment: {rotator cuff assessment:25234} Biceps assessment: {biceps assessment:25235}  LUMBAR ROM:   {AROM/PROM:27142}  Eval  Flexion   Extension   Right lateral flexion   Left lateral flexion   Right rotation   Left rotation    (Blank rows = not tested)  LUMBAR SPECIAL TESTS:  {lumbar special test:25242}  MUSCLE LENGTH: Hamstrings: Right *** deg; Left *** deg Thomas test: Right *** deg; Left *** deg Hamstrings: *** ITB: *** Piriformis: *** Hip flexors: *** Quads: *** Heelcord: ***  LOWER EXTREMITY ROM:  {AROM/PROM:27142} ROM Right eval Left eval  Hip flexion    Hip extension    Hip abduction    Hip adduction    Hip internal rotation    Hip external rotation    Knee flexion    Knee extension    Ankle dorsiflexion    Ankle plantarflexion    Ankle inversion    Ankle eversion     (Blank rows = not tested)  LOWER EXTREMITY MMT:  MMT Right eval Left eval  Hip flexion    Hip extension    Hip abduction    Hip adduction    Hip internal rotation    Hip external rotation    Knee flexion    Knee extension    Ankle dorsiflexion    Ankle plantarflexion    Ankle inversion    Ankle eversion     (Blank rows = not tested)  LOWER EXTREMITY SPECIAL TESTS:  {LEspecialtests:26242}  FUNCTIONAL TESTS:  {Functional tests:24029}  As of 07/20/22 PT eval: 5 times  sit to stand: 21.4 sec  Berg Balance Scale: 42/56 Significant risk for falls Dynamic Gait Index: 18/24 (07/28/22) MCTSIB: Condition 1: Avg of 3 trials: 30 sec, Condition 2: Avg of 3 trials: 30 sec, Condition 3: Avg of 3 trials: 30 sec, Condition 4: Avg of 3 trials: 30 sec, and Total Score: 120/120  BED MOBILITY:  {Bed mobility:24027}  TRANSFERS: Assistive device utilized: {Assistive devices:23999}  Sit to stand: {Levels of assistance:24026} Stand to sit: {Levels of assistance:24026} Chair to chair: {Levels of assistance:24026} Floor: {Levels of assistance:24026}  GAIT: Distance walked: *** Assistive device utilized: {Assistive devices:23999} Level of assistance: {Levels of assistance:24026} Gait pattern: {gait characteristics:25376} Comments: *** *** without AD: decreased step length R, Wide  BOS: with cane: improved gait pattern and patient confidence   RAMP: Level of Assistance: {Levels of assistance:24026} Assistive device utilized: {Assistive devices:23999} Ramp Comments: ***  CURB:  Level of Assistance: {Levels of assistance:24026} Assistive device utilized: {Assistive devices:23999} Curb Comments: ***  STAIRS:  Level of Assistance: {Levels of assistance:24026}  Stair Negotiation Technique: {Stair Technique:27161} with {Rail Assistance:27162}  Number of Stairs: ***   Height of Stairs: ***  Comments: *** *** Stairs: step to gait; able to do reciprocal but must pull up with B UE with L leg.   TODAY'S TREATMENT: ***   PATIENT EDUCATION:  Education details: {Education details:27468} Person educated: {Person educated:25204} Education method: {Education Method:25205} Education comprehension: {Education Comprehension:25206}  HOME EXERCISE PROGRAM: ***  Access Code: [email or web address] URL: [email or web address] Date: 08/13/2022 Prepared by: Glenetta Hew   Exercises - Supine Bridge  - 2 x daily - 3 x weekly - 1-2 sets - 10 reps - Sit to Stand with Armchair   - 2 x daily - 7 x weekly - 1 sets - 5-10 reps - Seated Piriformis Stretch  - 1 x daily - 7 x weekly - 1 sets - 3 reps - 30-60 sec hold - Standing Hip Extension with Resistance at Ankles and Counter Support  - 1 x daily - 3 x weekly - 3 sets - 10 reps - Side Stepping with Resistance at Thighs and Counter Support  - 1 x daily - 3 x weekly - 3 sets - 10 reps - Standing Lumbar Extension at Wall - Forearms  - 2 x daily - 7 x weekly - 1 sets - 5 reps - Seated Hip Abduction with Resistance  - 1 x daily - 3 x weekly - 3 sets - 10 reps - Supine Lower Trunk Rotation  - 2 x daily - 7 x weekly - 2 sets - 10 reps - 5-10 sec hold - Corner Balance Feet Together With Eyes Open  - 1 x daily - 5 x weekly - 3 reps - 30 sec hold - Corner Balance Feet Together: Eyes Open With Head Turns  - 1 x daily - 5 x weekly - 2 sets - 5 reps - Semi-Tandem Corner Balance With Eyes Open  - 1 x daily - 5 x weekly - 3 reps - 20-30 sec hold - Seated Scapular Resetting  - 1 x daily - 7 x weekly - 2 sets - 10 reps - 3 sec hold  ASSESSMENT:  CLINICAL IMPRESSION: [name] is a 89 y.o. female who was referred to physical therapy for evaluation and treatment for balance problems and weakness.  She had previously initiated PT episode from 07/20/2022 to 08/17/2022 but was unable to complete this episode due to being diagnosed with shingles. ***     Janely will benefit from skilled PT to address above deficits to improve mobility and activity tolerance with decreased pain interference and decreased risk for falls.   OBJECTIVE IMPAIRMENTS: {opptimpairments:25111}.   ACTIVITY LIMITATIONS: {activitylimitations:27494}  PARTICIPATION LIMITATIONS: {participationrestrictions:25113}  PERSONAL FACTORS: {Personal factors:25162} are also affecting patient's functional outcome.   REHAB POTENTIAL: {rehabpotential:25112}  CLINICAL DECISION MAKING: {clinical decision making:25114}  EVALUATION COMPLEXITY: {Evaluation  complexity:25115}   GOALS: Goals reviewed with patient? {yes/no:20286}  SHORT TERM GOALS: Target date: ***  Patient will be independent with initial HEP. Baseline: *** Goal status: {GOALSTATUS:25110}  2.  Patient will verbalize understanding of strategies to decrease risk of falls. Baseline: Education on fall prevention techniques provided during last PT episode Goal status: {GOALSTATUS:25110}  3.  ***  Baseline: *** Goal status: {GOALSTATUS:25110}  LONG TERM GOALS: Target date: ***  Patient will be independent with advanced/ongoing HEP to improve outcomes and carryover.  Baseline: *** Goal status: {GOALSTATUS:25110}  2.  Patient will report at least 50-75% improvement in *** pain to improve QOL. Baseline: *** Goal status: {GOALSTATUS:25110}  3.  Patient to demonstrate ability to achieve and maintain good spinal alignment/posturing and body mechanics needed for daily activities. Baseline: *** Goal status: {GOALSTATUS:25110}  4.  Patient will demonstrate functional pain free lumbar ROM to perform ADLs.   Baseline: *** Goal status: {GOALSTATUS:25110}  5.  Patient will demonstrate improved *** AROM to {Functional status:27472} to allow for normal gait and stair mechanics. Baseline: *** Goal status: {GOALSTATUS:25110}  6.  Patient will demonstrate improved *** strength to >/= ***/5 for functional UE use. Baseline:  Goal status: {GOALSTATUS:25110}  7.  Patient will demonstrate improved *** strength to >/= ***/5 for improved stability and ease of mobility. Baseline: *** Goal status: {GOALSTATUS:25110}  8.  Patient will be able to ambulate 600' with or w/o LRAD and normal gait pattern with good safety to access community.  Baseline: *** Goal status: {GOALSTATUS:25110}  9. Patient will be able to ascend/descend curb safely with or w/o LRAD for safety with community ambulation.  Baseline: *** Goal status: {GOALSTATUS:25110}  10.  Patient will report >/= ***% on ABC  scale to demonstrate improved functional ability. (MDIC = 19) Baseline: *** Goal status: {GOALSTATUS:25110}  11.  Patient will improve Berg score to >/= ***/56 to improve safety and stability with ADLs in standing and reduce risk for falls. (MDIC = 8 points) Baseline: *** Goal status: {GOALSTATUS:25110}  12.  Patient will improve FGA score to >/= ***/30 to improve gait stability and reduce risk for falls. (MDIC = 8 points). Baseline: *** Goal status: {GOALSTATUS:25110}  13.  Patient will demonstrate at least 19/24 on DGI to decrease risk of falls. Baseline: *** Goal status: {GOALSTATUS:25110}      PLAN:  PT FREQUENCY: {rehab frequency:25116}  PT DURATION: {rehab duration:25117}  PLANNED INTERVENTIONS: {rehab planned interventions:25118::"Therapeutic exercises","Therapeutic activity","Neuromuscular re-education","Balance training","Gait training","Patient/Family education","Self Care","Joint mobilization"}  PLAN FOR NEXT SESSION: ***   Marry Guan, PT 12/10/2022, 12:26 PM

## 2022-12-14 ENCOUNTER — Ambulatory Visit (INDEPENDENT_AMBULATORY_CARE_PROVIDER_SITE_OTHER): Payer: Medicare HMO | Admitting: Podiatry

## 2022-12-14 DIAGNOSIS — M205X1 Other deformities of toe(s) (acquired), right foot: Secondary | ICD-10-CM | POA: Diagnosis not present

## 2022-12-14 DIAGNOSIS — L84 Corns and callosities: Secondary | ICD-10-CM | POA: Diagnosis not present

## 2022-12-14 DIAGNOSIS — M722 Plantar fascial fibromatosis: Secondary | ICD-10-CM

## 2022-12-14 MED ORDER — TRIAMCINOLONE ACETONIDE 10 MG/ML IJ SUSP
10.0000 mg | Freq: Once | INTRAMUSCULAR | Status: AC
  Administered 2022-12-14: 10 mg via INTRAMUSCULAR

## 2022-12-14 NOTE — Progress Notes (Signed)
Chief Complaint  Patient presents with   Plantar Fasciitis    LEFT FOOT, BETTER WHEN SHE IS WALKING, BUT WHEN SHE IS DRIVING/ RIDING IN A CAR AND AT NIGHT SHE IS STILL HAVING SOME PAIN, WANTS AN INJECTION IF SHE CAN HAVE ONE AND IF YOU THINK IT WILL HELP   Callouses    CORN RIGHT 5TH    HPI: 89 y.o. female presenting today for follow-up of left plantar fasciitis.  She notes the pain on the plantar medial aspect of the heel has improved but now she is having pain on the plantar lateral aspect of the heel.  Denies any other injury since last seen.  Pain is rated as 6/10.  She is also requesting the painful corn on the medial aspect of the right fifth toe be shaved.  She has been given some spacers in the past but they have not been successful in alleviating them and consistency of wearing them may also be an issue.  Past Medical History:  Diagnosis Date   Aortic valve sclerosis    CAD (coronary artery disease)    S/p DES to the proximal RCA, POBA to the distal RCA 8/08 //Myoview 2009 low risk // Myoview 9/22: EF 57, no ischemia or infarction, apical thinning artifact, low risk study   DUB (dysfunctional uterine bleeding)    Elevated cholesterol    Atorvastatin   GERD (gastroesophageal reflux disease)    History of echocardiogram    EF 60-65, GRII DD, GLS -18.4, normal RVSF, moderate MAC, moderate AV calcification, trivial AI   Hypertension    Plantar fasciitis    Shingles     Past Surgical History:  Procedure Laterality Date   APPENDECTOMY  2011   CARDIAC STINT IMPLANT  2008   CARPAL TUNNEL RELEASE     EYE SURGERY     INGUINAL HERNIA REPAIR     OOPHORECTOMY  2001   LAVH,BSO   ULNA NERVE RELEASE  2009   VAGINAL HYSTERECTOMY  2001   LAVH, BSO    No Known Allergies   Physical Exam: General: The patient is alert and oriented x3 in no acute distress.  Dermatology:  No ecchymosis, erythema, or edema bilateral.  No open lesions.  There is a small hard hyperkeratotic lesion on  the medial aspect of the fifth toe at the DIP joint.  No edema is noted.  No erythema to the fifth toe is noted.  Pain on palpation of the lesion  Vascular: Palpable pedal pulses bilaterally. Capillary refill within normal limits.  No appreciable edema.    Neurological: Light touch sensation intact bilateral.  MMT 5/5 to lower extremity bilateral. Negative Tinel's sign with percussion of the posterior tibial nerve on the affected extremity.    Musculoskeletal Exam:  There is pain on palpation of the plantar lateral aspect of left heel.  No gaps or nodules within the plantar fascia.  Positive Windlass mechanism bilateral.  Antalgic gait noted with first few steps upon standing.  No pain on palpation of achilles tendon bilateral.  Ankle df less than 10 degrees with knee extended b/l.  Adductovarus deformity right fifth toe  Assessment/Plan of Care: 1. Plantar fascial fibromatosis   2. Corn of toe   3. Adductovarus rotation of toe, acquired, right     Meds ordered this encounter  Medications   triamcinolone acetonide (KENALOG) 10 MG/ML injection 10 mg   AMB REFERRAL TO PHYSICAL THERAPY  -Reviewed etiology of plantar fasciitis with patient.  Discussed treatment options with  patient today, including cortisone injection, NSAID course of treatment, stretching exercises, physical therapy, use of night splint, rest, icing the heel, arch supports/orthotics, and supportive shoe gear.    With the patient's verbal consent, a corticosteroid injection was administered to the plantar lateral aspect of the left heel, consisting of a mixture of 1% lidocaine plain, 0.5% Sensorcaine plain, and Kenalog-10 for a total of 1.5cc administered.  A Band-aid was applied. Pain level post-injection is 2/10.  Referral was placed for physical therapy.  The medial right fifth toe corn was shaved with a sterile #[id number] blade today.  A mini gel double buddy toe splint was fitted to the right fourth and fifth toes today.  She  noted immediate improvement and seemed to like the spacer.  She was informed that these are not stocked at this office and if she likes them she will need to purchase them off of [employer].  Return if symptoms worsen or fail to improve.   Clerance Lav, DPM, FACFAS [facility]     [address] [place], [place] [address]                Office [phone]  Fax [phone]

## 2022-12-15 ENCOUNTER — Ambulatory Visit: Payer: Medicare HMO | Attending: Family Medicine | Admitting: Physical Therapy

## 2022-12-15 ENCOUNTER — Other Ambulatory Visit (HOSPITAL_BASED_OUTPATIENT_CLINIC_OR_DEPARTMENT_OTHER): Payer: Self-pay

## 2022-12-15 ENCOUNTER — Other Ambulatory Visit: Payer: Self-pay

## 2022-12-15 DIAGNOSIS — M79672 Pain in left foot: Secondary | ICD-10-CM | POA: Diagnosis present

## 2022-12-15 DIAGNOSIS — R2689 Other abnormalities of gait and mobility: Secondary | ICD-10-CM | POA: Diagnosis present

## 2022-12-15 DIAGNOSIS — R2681 Unsteadiness on feet: Secondary | ICD-10-CM | POA: Diagnosis present

## 2022-12-15 DIAGNOSIS — R531 Weakness: Secondary | ICD-10-CM | POA: Diagnosis not present

## 2022-12-15 DIAGNOSIS — M722 Plantar fascial fibromatosis: Secondary | ICD-10-CM | POA: Diagnosis not present

## 2022-12-15 DIAGNOSIS — M6281 Muscle weakness (generalized): Secondary | ICD-10-CM | POA: Insufficient documentation

## 2022-12-15 MED ORDER — FLUAD 0.5 ML IM SUSY
PREFILLED_SYRINGE | INTRAMUSCULAR | 0 refills | Status: DC
  Filled 2022-12-15: qty 0.5, 1d supply, fill #0

## 2022-12-24 ENCOUNTER — Encounter: Payer: Self-pay | Admitting: Physical Therapy

## 2022-12-24 ENCOUNTER — Ambulatory Visit: Payer: Medicare HMO | Admitting: Physical Therapy

## 2022-12-24 DIAGNOSIS — M79672 Pain in left foot: Secondary | ICD-10-CM

## 2022-12-24 DIAGNOSIS — R2681 Unsteadiness on feet: Secondary | ICD-10-CM

## 2022-12-24 DIAGNOSIS — M6281 Muscle weakness (generalized): Secondary | ICD-10-CM

## 2022-12-24 DIAGNOSIS — R2689 Other abnormalities of gait and mobility: Secondary | ICD-10-CM

## 2022-12-24 NOTE — Therapy (Addendum)
OUTPATIENT PHYSICAL THERAPY TREATMENT   Patient Name: [name] MRN: [id number] DOB:Nov 30, 1926, 89 y.o., female Today's Date: 12/24/2022  END OF SESSION:  PT End of Session - 12/24/22 1017     Visit Number 2    Date for PT Re-Evaluation 02/09/23    Authorization Type [employer]    PT Start Time 1017    PT Stop Time 1101    PT Time Calculation (min) 44 min    Activity Tolerance Patient tolerated treatment well    Behavior During Therapy WFL for tasks assessed/performed              Past Medical History:  Diagnosis Date   Aortic valve sclerosis    CAD (coronary artery disease)    S/p DES to the proximal RCA, POBA to the distal RCA 8/08 //Myoview 2009 low risk // Myoview 9/22: EF 57, no ischemia or infarction, apical thinning artifact, low risk study   DUB (dysfunctional uterine bleeding)    Elevated cholesterol    Atorvastatin   GERD (gastroesophageal reflux disease)    History of echocardiogram    EF 60-65, GRII DD, GLS -18.4, normal RVSF, moderate MAC, moderate AV calcification, trivial AI   Hypertension    Plantar fasciitis    Shingles    Past Surgical History:  Procedure Laterality Date   APPENDECTOMY  2011   CARDIAC STINT IMPLANT  2008   CARPAL TUNNEL RELEASE     EYE SURGERY     INGUINAL HERNIA REPAIR     OOPHORECTOMY  2001   LAVH,BSO   ULNA NERVE RELEASE  2009   VAGINAL HYSTERECTOMY  2001   LAVH, BSO   Patient Active Problem List   Diagnosis Date Noted   High risk medication use 03/25/2022   Bilateral sensorineural hearing loss 12/29/2021   Anxiety 11/24/2021   Aortic valve sclerosis 02/24/2021   Varicose veins of both lower extremities 02/24/2021   Left knee pain 04/28/2015   Coronary artery disease involving native coronary artery of native heart without angina pectoris 11/22/2013   Essential hypertension 11/22/2013   Pure hypercholesterolemia 11/22/2013   Inversion, nipple 09/05/2013   Shingles    Elevated cholesterol    Cancer  (HCC)    Right knee pain 05/10/2011    PCP: Clayborne Dana, NP   REFERRING PROVIDER: Clayborne Dana, NP (balance & weakness); McCaughan, Dia D, DPM (plantar fascial fibromatosis)  REFERRING DIAG:  R26.89 (ICD-10-CM) - Balance problems  R53.1 (ICD-10-CM) - Weakness  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  EVAL:  Pt was receiving PT earlier this year for balance problems but had to stop due to developing shingles. After the shingles resolved she developed L plantar fasciitis which further limited her mobility exacerbating her overall weakness and balance issues. She received an injection for the plantar fasciitis yesterday which has helped a lot - no pain today. New PT referral received to "Eval & treat" for the plantar fasciitis in addition to initial referral for balance and weakness. She notes difficulty with tasks where she has to lift overhead (dishes into the cabinets) and feels  unsteady. She has a cane which she brought to PT today but states she has not been using it normally. She tries to walk 10 minute daily in her house but has not kept up with the HEP from her last PT episode.  PAIN: Are you having pain? No  PERTINENT HISTORY: Anxiety, CAD, HTN, LBP, B knee pain, GERD, shingles (June 2024), plantar fasciitis (July/Aug 2024)  PRECAUTIONS: Fall  HAND DOMINANCE: Right  RED FLAGS: None  WEIGHT BEARING RESTRICTIONS: No  FALLS:  Has patient fallen in last 6 months? 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 increased calcaneal inclination angle on lateral view.  Inferior calcaneal spur present.  No fracture noted  PATIENT SURVEYS:  ABC scale 930 / 1600 = 58.1 % LEFS 46 / 80 = 57.5 %  ABC scale as of 07/20/2022 PT eval: 61.3%  COGNITION: Overall cognitive status: Within functional limits for tasks assessed     SENSATION: WFL  EDEMA:  Chronic B ankle edema  POSTURE:  rounded shoulders, forward head, and increased thoracic kyphosis  UPPER EXTREMITY ROM:   Active ROM Right 12/24/22 Left 12/24/22  Shoulder flexion 129 121  Shoulder extension 55 54  Shoulder abduction 140 114  Shoulder adduction    Shoulder internal rotation FIR [facility] FIR WFL  Shoulder external rotation FER T2 FER T1  Elbow flexion    Elbow extension    Wrist flexion    Wrist extension    Wrist ulnar deviation    Wrist radial deviation    Wrist pronation    Wrist supination    (Blank rows = not tested)  UPPER EXTREMITY MMT:   MMT Right 12/24/22 Left 12/24/22  Shoulder flexion 4+ 4+  Shoulder extension 4+ 4+  Shoulder abduction 4+ 4+  Shoulder adduction    Shoulder internal rotation 4+ 4+  Shoulder external rotation 4 4-  Middle trapezius    Lower trapezius    Elbow flexion    Elbow extension    Wrist flexion    Wrist extension    Wrist ulnar deviation    Wrist radial deviation    Wrist pronation    Wrist supination    Grip  strength (lbs)    (Blank rows = not tested)  MUSCLE LENGTH: Hamstrings: Mild tight L>R ITB: NT Piriformis: Mild tight L>R Hip flexors: Mild tight B  LOWER EXTREMITY ROM: Proximal B LE AROM WFL Active ROM Right eval Left eval  Hip flexion    Hip extension    Hip abduction    Hip adduction    Hip internal rotation    Hip external rotation    Knee flexion    Knee extension    Ankle dorsiflexion 20 20  Ankle plantarflexion 42 40  Ankle inversion 34 28  Ankle eversion 24 18   (Blank rows = not tested)  LOWER EXTREMITY MMT:  MMT Right eval Left eval  Hip flexion 4- 4-  Hip extension 3+ 3+  Hip abduction 4 4  Hip adduction 4- 4-  Hip internal rotation 4+ 4  Hip external rotation 4- 4-  Knee flexion 4+ 4+  Knee extension 4+ 4+  Ankle dorsiflexion 4+ 4+  Ankle plantarflexion 5 5  Ankle inversion 4- 4-  Ankle eversion 4- 4-   (Blank rows = not tested)  FUNCTIONAL TESTS:  5 times sit to stand: 21.44 sec with need for B UE assist - limited by B knee pain  Timed up and go (TUG): 16.09 sec w/o AD; >13.5 sec indicates high risk for falls 10 meter walk test: 12.25 sec w/o AD, Gait speed = 2.68 ft sec w/o AD Berg Balance Scale: 38/56; 37-45 significant risk for falls (>80%) - 12/24/22  Functional gait assessment: 14/30; < 19 = high risk fall  As of 07/20/22 PT eval: 5 times sit to stand: 21.4 sec  Berg Balance Scale: 42/56 Significant risk for falls Dynamic Gait Index: 18/24 (07/28/22) MCTSIB: Condition 1: Avg of 3 trials: 30 sec, Condition 2: Avg of 3 trials: 30 sec, Condition 3: Avg of 3 trials: 30 sec, Condition 4: Avg of 3 trials: 30 sec, and Total Score: 120/120  BED MOBILITY:  Sit to supine Complete Independence Supine to sit Complete Independence Rolling to Right SBA  TRANSFERS: Assistive device utilized: None  Sit to stand: Modified independence Stand to sit: Modified independence Chair to chair: Modified independence Floor:  NT  GAIT: Distance walked: Clinic  distances Assistive device utilized: Single point cane and None Level of assistance: Complete Independence and Modified independence Gait pattern:  without AD, step through pattern, decreased step length- Right, decreased hip/knee flexion- Left, wide BOS, and poor foot clearance- Left, Decreased heel strike on left -  Comments: with cane: improved gait pattern and patient confidence   RAMP: Level of Assistance:  NT Assistive device utilized:    Ramp Comments:   CURB:  Level of Assistance:  NT Assistive device utilized:    Curb Comments:   STAIRS:  Level of Assistance: SBA  Stair Negotiation Technique: Step to Pattern with Single Rail on Right - B hands on rail on ascent  Number of Stairs: 7   Height of Stairs: 7"  Comments: pulls up with B UE leading with L leg  TODAY'S TREATMENT:  12/24/22 THERAPEUTIC ACTIVITIES: UE ROM & MMT assessment B ankle ROM assessment Berg = 38/56  THERAPEUTIC EXERCISE: to improve flexibility, strength and mobility.  Demonstration, verbal and tactile cues throughout for technique. L long-sitting plantar fascia stretch with towel x 30" L figure-4 plantar fascia stretch x 30" L standing plantar fascia/heelcord stretch with toes on 2" block/book x 30" Demonstration in plantar fascia self-STM/massage using roller/small cylindrical object, small ball or ice water bottle   12/15/22 - Eval only    PATIENT EDUCATION:  Education details: PT eval findings, anticipated POC, initial HEP, and self-STM techniques to L foot/plantar fascia using roller/small cylindrical object, small ball or ice water bottle Person educated: Patient Education method: Explanation, Demonstration, Verbal cues, and Handouts Education comprehension: verbalized understanding, returned demonstration, verbal cues required, and needs further education  HOME EXERCISE PROGRAM: Access Code: [email or web address] URL: [email or web address] Date: 12/24/2022 Prepared by: Glenetta Hew  Exercises - Seated Plantar Fascia Stretch (Mirrored)  - 1-2 x daily - 7 x weekly - 3 reps - 30 sec hold - Plantar Fascia Stretch on Step (Mirrored)  - 1-2 x daily - 7 x weekly - 3 reps -  30 sec hold - Foot Roller Plantar Massage  - 1-2 x daily - 7 x weekly - 2-3 min hold  Access Code: [email or web address] (from prior PT episode) URL: [email or web address] Date: 08/13/2022 Prepared by: Glenetta Hew   Exercises - Supine Bridge  - 2 x daily - 3 x weekly - 1-2 sets - 10 reps - Sit to Stand with Armchair  - 2 x daily - 7 x weekly - 1 sets - 5-10 reps - Seated Piriformis Stretch  - 1 x daily - 7 x weekly - 1 sets - 3 reps - 30-60 sec hold - Standing Hip Extension with Resistance at Ankles and Counter Support  - 1 x daily - 3 x weekly - 3 sets - 10 reps - Side Stepping with Resistance at Thighs and Counter Support  - 1 x daily - 3 x weekly - 3 sets - 10 reps - Standing Lumbar Extension at Wall - Forearms  - 2 x daily - 7 x weekly - 1 sets - 5 reps - Seated Hip Abduction with Resistance  - 1 x daily - 3 x weekly - 3 sets - 10 reps - Supine Lower Trunk Rotation  - 2 x daily - 7 x weekly - 2 sets - 10 reps - 5-10 sec hold - Corner Balance Feet Together With Eyes Open  - 1 x daily - 5 x weekly - 3 reps - 30 sec hold - Corner Balance Feet Together: Eyes Open With Head Turns  - 1 x daily - 5 x weekly - 2 sets - 5 reps - Semi-Tandem Corner Balance With Eyes Open  - 1 x daily - 5 x weekly - 3 reps - 20-30 sec hold - Seated Scapular Resetting  - 1 x daily - 7 x weekly - 2 sets - 10 reps - 3 sec hold  ASSESSMENT:  CLINICAL IMPRESSION: Completed remaining eval assessments including UE ROM and strength, ankle ROM and Berg balance assessment. B UE ROM/strength grossly WFL (except limited abduction on L) but pt reports limited ability to lift overhead which is likely more related to postural and scapular weakness. B ankle ROM WNL. Provided instruction in stretching for plantar fascia and will plan  to introduce ankle and proximal LE strengthening next visit along with postural strengthening to help with functional UE use. Berg score of 38/56 revealing significant risk for fall (>80%), confirming high risk for falls observed with initial testing.  Daysy will benefit from continued skilled PT to address above deficits to improve mobility and activity tolerance with decreased pain interference and decreased risk for falls.   OBJECTIVE IMPAIRMENTS: Abnormal gait, decreased activity tolerance, decreased balance, decreased coordination, decreased knowledge of condition, decreased knowledge of use of DME, decreased mobility, difficulty walking, decreased strength, decreased safety awareness, improper body mechanics, postural dysfunction, and pain.   ACTIVITY LIMITATIONS: carrying, lifting, standing, squatting, stairs, transfers, bed mobility, bathing, toileting, dressing, reach over head, and locomotion level  PARTICIPATION LIMITATIONS: meal prep, cleaning, laundry, shopping, and community activity  PERSONAL FACTORS: Age, Past/current experiences, Time since onset of injury/illness/exacerbation, and 3+ comorbidities: Anxiety, CAD, HTN, LBP, B knee pain, GERD  are also affecting patient's functional outcome.   REHAB POTENTIAL: Good  CLINICAL DECISION MAKING: Evolving/moderate complexity  EVALUATION COMPLEXITY: Moderate   GOALS: Goals reviewed with patient? Yes  SHORT TERM GOALS: Target date: 01/05/2023  Patient will be independent with initial HEP. Baseline:  Goal status: IN PROGRESS  2.  Patient will verbalize understanding of strategies to decrease risk  of falls. Baseline: Education on fall prevention techniques provided during last PT episode Goal status: IN PROGRESS  3.  Patient will demonstrate decreased TUG time to </= 13.5 sec to decrease risk for falls with transitional mobility. Baseline: 16.09 sec without AD Goal status: IN PROGRESS   LONG TERM GOALS: Target date:  02/09/2023  Patient will be independent with advanced/ongoing HEP to improve outcomes and carryover.  Baseline:  Goal status: IN PROGRESS  2.  Patient will report at least 50-75% improvement in L foot pain to improve QOL. Baseline: 0/10 following injection 12/14/22 Goal status: IN PROGRESS  3.  Patient will demonstrate improved overall B UE strength to >/= 4+/5 for functional UE use. Baseline: TBA Goal status: REVISED  4.  Patient will demonstrate improved B LE strength to >/= 4+/5 for improved stability and ease of mobility. Baseline: Refer to above LE MMT table Goal status: IN PROGRESS  5.  Patient will be able to ambulate 600' with or w/o LRAD and normal gait pattern with good safety to access community.  Baseline:  Goal status: IN PROGRESS  6. Patient will be able to ascend/descend curb safely with or w/o LRAD for safety with community ambulation.  Baseline:  Goal status: IN PROGRESS  7.  Patient will report >/= 70% on ABC scale to demonstrate improved balance confidence with functional mobility.  Baseline: 930 / 1600 = 58.1 % Goal status: IN PROGRESS  8.  Patient will improve Berg score by at least 8 points (46/56) to improve safety and stability with ADLs in standing and reduce risk for falls.  Baseline: 38/56 Goal status: IN PROGRESS  9.  Patient will improve FGA score to >/= 19/30 to improve gait stability and reduce risk for falls. Baseline: 14/30 Goal status: IN PROGRESS  10.  Patient will report >/= 66% on LEFS to demonstrate improved functional ability. Baseline: 46 / 80 = 57.5 % Goal status: IN PROGRESS    PLAN:  PT FREQUENCY: 2x/week  PT DURATION: 6-8 weeks  PLANNED INTERVENTIONS: Therapeutic exercises, Therapeutic activity, Neuromuscular re-education, Balance training, Gait training, Patient/Family education, Self Care, Joint mobilization, Joint manipulation, Stair training, Dry Needling, Electrical stimulation, Cryotherapy, Moist heat, Taping,  Vasopneumatic device, Ultrasound, Manual therapy, and Re-evaluation  PLAN FOR NEXT SESSION: Review initial HEP for plantar fascia stretches + review/update HEP from previous PT episode and incorporate appropriate exercises into current episode HEP   Marry Guan, PT 12/24/2022, 1:33 PM

## 2022-12-28 ENCOUNTER — Ambulatory Visit: Payer: Medicare HMO | Admitting: Physical Therapy

## 2022-12-28 ENCOUNTER — Encounter: Payer: Self-pay | Admitting: Physical Therapy

## 2022-12-28 DIAGNOSIS — R2681 Unsteadiness on feet: Secondary | ICD-10-CM | POA: Diagnosis not present

## 2022-12-28 DIAGNOSIS — M6281 Muscle weakness (generalized): Secondary | ICD-10-CM

## 2022-12-28 DIAGNOSIS — R2689 Other abnormalities of gait and mobility: Secondary | ICD-10-CM

## 2022-12-28 NOTE — Therapy (Signed)
OUTPATIENT PHYSICAL THERAPY TREATMENT   Patient Name: [name] MRN: [id number] DOB:[birth year], 89 y.o., female Today's Date: 12/28/2022  END OF SESSION:  PT End of Session - 12/28/22 1117     Visit Number 3    Date for PT Re-Evaluation 02/09/23    Authorization Type [employer]    PT Start Time 1117   Pt arrived late   PT Stop Time 1146    PT Time Calculation (min) 29 min    Activity Tolerance Patient tolerated treatment well    Behavior During Therapy [facility] for tasks assessed/performed              Past Medical History:  Diagnosis Date   Aortic valve sclerosis    CAD (coronary artery disease)    S/p DES to the proximal RCA, POBA to the distal RCA 8/08 //Myoview 2009 low risk // Myoview 9/22: EF 57, no ischemia or infarction, apical thinning artifact, low risk study   DUB (dysfunctional uterine bleeding)    Elevated cholesterol    Atorvastatin   GERD (gastroesophageal reflux disease)    History of echocardiogram    EF 60-65, GRII DD, GLS -18.4, normal RVSF, moderate MAC, moderate AV calcification, trivial AI   Hypertension    Plantar fasciitis    Shingles    Past Surgical History:  Procedure Laterality Date   APPENDECTOMY  2011   CARDIAC STINT IMPLANT  2008   CARPAL TUNNEL RELEASE     EYE SURGERY     INGUINAL HERNIA REPAIR     OOPHORECTOMY  2001   LAVH,BSO   ULNA NERVE RELEASE  2009   VAGINAL HYSTERECTOMY  2001   LAVH, BSO   Patient Active Problem List   Diagnosis Date Noted   High risk medication use 03/25/2022   Bilateral sensorineural hearing loss 12/29/2021   Anxiety 11/24/2021   Aortic valve sclerosis 02/24/2021   Varicose veins of both lower extremities 02/24/2021   Left knee pain 04/28/2015   Coronary artery disease involving native coronary artery of native heart without angina pectoris 11/22/2013   Essential hypertension 11/22/2013   Pure hypercholesterolemia 11/22/2013   Inversion, nipple 09/05/2013   Shingles    Elevated  cholesterol    Cancer (HCC)    Right knee pain 05/10/2011    PCP: Clayborne Dana, NP   REFERRING PROVIDER: Clayborne Dana, NP (balance & weakness); McCaughan, Dia D, DPM (plantar fascial fibromatosis)  REFERRING DIAG:  R26.89 (ICD-10-CM) - Balance problems  R53.1 (ICD-10-CM) - Weakness  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  Pt reports increased L knee pain during the warm-up - states she plans to ask about an injection for her knee.  EVAL:  Pt was receiving PT earlier this year for balance problems but had to stop due to developing shingles. After the shingles resolved she developed L plantar fasciitis which further limited her mobility exacerbating her overall weakness and balance issues. She received an injection for the plantar fasciitis yesterday which has helped a lot - no pain today. New PT referral received to "Eval & treat" for the plantar fasciitis in addition to initial referral for balance and weakness. She notes difficulty  with tasks where she has to lift overhead (dishes into the cabinets) and feels unsteady. She has a cane which she brought to PT today but states she has not been using it normally. She tries to walk 10 minute daily in her house but has not kept up with the HEP from her last PT episode.  PAIN: Are you having pain? Yes: NPRS scale: during warm-up on NuStep 7/10 Pain location: L knee Pain description: constant Aggravating factors: NuStep Relieving factors: ceasing activity  PERTINENT HISTORY: Anxiety, CAD, HTN, LBP, B knee pain, GERD, shingles (June 2024), plantar fasciitis (July/Aug 2024)  PRECAUTIONS: Fall  HAND DOMINANCE:  Right  RED FLAGS: None  WEIGHT BEARING RESTRICTIONS: No  FALLS:  Has patient fallen in last 6 months? 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 increased calcaneal inclination angle on lateral view.  Inferior calcaneal spur present.  No fracture noted  PATIENT SURVEYS:  ABC scale 930 / 1600 = 58.1 % LEFS 46 / 80 = 57.5 %  ABC scale as of 07/20/2022 PT eval: 61.3%  COGNITION: Overall cognitive status: Within functional limits for tasks assessed     SENSATION: WFL  EDEMA:  Chronic B ankle edema  POSTURE:  rounded shoulders, forward head, and increased thoracic kyphosis  UPPER EXTREMITY ROM:   Active ROM Right 12/24/22 Left 12/24/22  Shoulder flexion 129 121  Shoulder extension 55 54  Shoulder abduction 140 114  Shoulder adduction    Shoulder internal rotation FIR [facility] FIR WFL  Shoulder external rotation FER T2 FER T1  Elbow flexion    Elbow extension    Wrist flexion    Wrist extension    Wrist ulnar deviation    Wrist radial deviation    Wrist pronation    Wrist supination    (Blank rows = not tested)  UPPER EXTREMITY MMT:   MMT Right 12/24/22 Left 12/24/22  Shoulder flexion 4+ 4+  Shoulder extension 4+ 4+  Shoulder abduction 4+ 4+  Shoulder adduction    Shoulder internal rotation 4+ 4+  Shoulder external rotation 4  4-  Middle trapezius    Lower trapezius    Elbow flexion    Elbow extension    Wrist flexion    Wrist extension    Wrist ulnar deviation    Wrist radial deviation    Wrist pronation    Wrist supination    Grip strength (lbs)    (Blank rows = not tested)  MUSCLE LENGTH: Hamstrings: Mild tight L>R ITB: NT Piriformis: Mild tight L>R Hip flexors: Mild tight B  LOWER EXTREMITY ROM: Proximal B LE AROM WFL Active ROM Right eval Left eval  Hip flexion    Hip extension    Hip abduction    Hip adduction    Hip internal rotation    Hip external rotation    Knee flexion    Knee extension    Ankle dorsiflexion  20 20  Ankle plantarflexion 42 40  Ankle inversion 34 28  Ankle eversion 24 18   (Blank rows = not tested)  LOWER EXTREMITY MMT:  MMT Right eval Left eval  Hip flexion 4- 4-  Hip extension 3+ 3+  Hip abduction 4 4  Hip adduction 4- 4-  Hip internal rotation 4+ 4  Hip external rotation 4- 4-  Knee flexion 4+ 4+  Knee extension 4+ 4+  Ankle dorsiflexion 4+ 4+  Ankle plantarflexion 5 5  Ankle inversion 4- 4-  Ankle eversion 4- 4-   (Blank rows = not tested)  FUNCTIONAL TESTS:  5 times sit to stand: 21.44 sec with need for B UE assist - limited by B knee pain  Timed up and go (TUG): 16.09 sec w/o AD; >13.5 sec indicates high risk for falls 10 meter walk test: 12.25 sec w/o AD, Gait speed = 2.68 ft sec w/o AD Berg Balance Scale: 38/56; 37-45 significant risk for falls (>80%) - 12/24/22  Functional gait assessment: 14/30; < 19 = high risk fall  As of 07/20/22 PT eval: 5 times sit to stand: 21.4 sec  Berg Balance Scale: 42/56 Significant risk for falls Dynamic Gait Index: 18/24 (07/28/22) MCTSIB: Condition 1: Avg of 3 trials: 30 sec, Condition 2: Avg of 3 trials: 30 sec, Condition 3: Avg of 3 trials: 30 sec, Condition 4: Avg of 3 trials: 30 sec, and Total Score: 120/120  BED MOBILITY:  Sit to supine Complete Independence Supine to sit Complete Independence Rolling  to Right SBA  TRANSFERS: Assistive device utilized: None  Sit to stand: Modified independence Stand to sit: Modified independence Chair to chair: Modified independence Floor:  NT  GAIT: Distance walked: Clinic distances Assistive device utilized: Single point cane and None Level of assistance: Complete Independence and Modified independence Gait pattern:  without AD, step through pattern, decreased step length- Right, decreased hip/knee flexion- Left, wide BOS, and poor foot clearance- Left, Decreased heel strike on left -  Comments: with cane: improved gait pattern and patient confidence   RAMP: Level of Assistance:  NT Assistive device utilized:    Ramp Comments:   CURB:  Level of Assistance:  NT Assistive device utilized:    Curb Comments:   STAIRS:  Level of Assistance: SBA  Stair Negotiation Technique: Step to Pattern with Single Rail on Right - B hands on rail on ascent  Number of Stairs: 7   Height of Stairs: 7"  Comments: pulls up with B UE leading with L leg  TODAY'S TREATMENT:  12/28/22 THERAPEUTIC EXERCISE: to improve flexibility, strength and mobility.  Demonstration, verbal and tactile cues throughout for technique.  NuStep - L4 x 3 min - discontinued due to increased  Seated KTOS piriformis stretch x 30" bil Standing lumbar extension at wall on forearms 10 x 3" Standing anatomical position with scapular retraction and depression at wall 10 x 5" Seated unilateral RTB hip ABD/ER clam 10 x 3" Seated RTB hip flexion march 10 x 3"    12/24/22 THERAPEUTIC ACTIVITIES: UE ROM & MMT assessment B ankle ROM assessment Berg = 38/56  THERAPEUTIC EXERCISE: to improve flexibility, strength and mobility.  Demonstration, verbal and tactile cues throughout for technique. L long-sitting plantar fascia stretch with towel x 30" L figure-4 plantar fascia stretch x 30" L standing plantar fascia/heelcord stretch with toes on 2" block/book x 30" Demonstration in plantar  fascia self-STM/massage using roller/small cylindrical object, small ball or ice water bottle   12/15/22 - Eval  only    PATIENT EDUCATION:  Education details: HEP update and self-STM techniques to L foot/plantar fascia using roller/small cylindrical object, small ball or ice water bottle Person educated: Patient Education method: Explanation, Demonstration, Verbal cues, and Handouts Education comprehension: verbalized understanding, returned demonstration, verbal cues required, and needs further education  HOME EXERCISE PROGRAM: Access Code: [email or web address] URL: [email or web address] Date: 12/28/2022 Prepared by: Glenetta Hew  Exercises - Seated Plantar Fascia Stretch (Mirrored)  - 1-2 x daily - 7 x weekly - 3 reps - 30 sec hold - Plantar Fascia Stretch on Step (Mirrored)  - 1-2 x daily - 7 x weekly - 3 reps - 30 sec hold - Foot Roller Plantar Massage  - 1-2 x daily - 7 x weekly - 2-3 min hold - Seated Piriformis Stretch  - 1-2 x daily - 7 x weekly - 3 reps - 30 sec hold - Standing Lumbar Extension at Wall - Forearms  - 1-2 x daily - 7 x weekly - 2 sets - 10 reps - 3-5 sec hold - Standing Anatomical Position with Scapular Retraction and Depression at Wall  - 1-2 x daily - 7 x weekly - 2 sets - 10 reps - 3 sec hold - Seated Isometric Hip Abduction with Resistance  - 1 x daily - 7 x weekly - 2 sets - 10 reps - 3 sec hold - Seated March with Resistance  - 1 x daily - 7 x weekly - 2 sets - 10 reps - 3 sec hold   ASSESSMENT:  CLINICAL IMPRESSION: [name] arrived 15 minutes late to her appt thinking it started at 11:15, hence treatment time limited.  She denied review of plantar fascia HEP today, therefore proceeded with review/update of HEP from previous PT episode, providing current handouts for exercises performed today.  Will plan to further review and update HEP as indicated in upcoming visits as we progress strengthening and balance activities.  [name] will benefit from continued  skilled PT to address ongoing deficits to improve mobility and activity tolerance with decreased pain interference and decreased risk for falls.   OBJECTIVE IMPAIRMENTS: Abnormal gait, decreased activity tolerance, decreased balance, decreased coordination, decreased knowledge of condition, decreased knowledge of use of DME, decreased mobility, difficulty walking, decreased strength, decreased safety awareness, improper body mechanics, postural dysfunction, and pain.   ACTIVITY LIMITATIONS: carrying, lifting, standing, squatting, stairs, transfers, bed mobility, bathing, toileting, dressing, reach over head, and locomotion level  PARTICIPATION LIMITATIONS: meal prep, cleaning, laundry, shopping, and community activity  PERSONAL FACTORS: Age, Past/current experiences, Time since onset of injury/illness/exacerbation, and 3+ comorbidities: Anxiety, CAD, HTN, LBP, B knee pain, GERD  are also affecting patient's functional outcome.   REHAB POTENTIAL: Good  CLINICAL DECISION MAKING: Evolving/moderate complexity  EVALUATION COMPLEXITY: Moderate   GOALS: Goals reviewed with patient? Yes  SHORT TERM GOALS: Target date: 01/05/2023  Patient will be independent with initial HEP. Baseline:  Goal status: IN PROGRESS  2.  Patient will verbalize understanding of strategies to decrease risk of falls. Baseline: Education on fall prevention techniques provided during last PT episode Goal status: IN PROGRESS  3.  Patient will demonstrate decreased TUG time to </= 13.5 sec to decrease risk for falls with transitional mobility. Baseline: 16.09 sec without AD Goal status: IN PROGRESS   LONG TERM GOALS: Target date: 02/09/2023  Patient will be independent with advanced/ongoing HEP to improve outcomes and carryover.  Baseline:  Goal status: IN PROGRESS  2.  Patient will report at least 50-75% improvement in L  foot pain to improve QOL. Baseline: 0/10 following injection 12/14/22 Goal status: IN  PROGRESS  3.  Patient will demonstrate improved overall B UE strength to >/= 4+/5 for functional UE use. Baseline: TBA Goal status: REVISED  4.  Patient will demonstrate improved B LE strength to >/= 4+/5 for improved stability and ease of mobility. Baseline: Refer to above LE MMT table Goal status: IN PROGRESS  5.  Patient will be able to ambulate 600' with or w/o LRAD and normal gait pattern with good safety to access community.  Baseline:  Goal status: IN PROGRESS  6. Patient will be able to ascend/descend curb safely with or w/o LRAD for safety with community ambulation.  Baseline:  Goal status: IN PROGRESS  7.  Patient will report >/= 70% on ABC scale to demonstrate improved balance confidence with functional mobility.  Baseline: 930 / 1600 = 58.1 % Goal status: IN PROGRESS  8.  Patient will improve Berg score by at least 8 points (46/56) to improve safety and stability with ADLs in standing and reduce risk for falls.  Baseline: 38/56 Goal status: IN PROGRESS  9.  Patient will improve FGA score to >/= 19/30 to improve gait stability and reduce risk for falls. Baseline: 14/30 Goal status: IN PROGRESS  10.  Patient will report >/= 66% on LEFS to demonstrate improved functional ability. Baseline: 46 / 80 = 57.5 % Goal status: IN PROGRESS    PLAN:  PT FREQUENCY: 2x/week  PT DURATION: 6-8 weeks  PLANNED INTERVENTIONS: Therapeutic exercises, Therapeutic activity, Neuromuscular re-education, Balance training, Gait training, Patient/Family education, Self Care, Joint mobilization, Joint manipulation, Stair training, Dry Needling, Electrical stimulation, Cryotherapy, Moist heat, Taping, Vasopneumatic device, Ultrasound, Manual therapy, and Re-evaluation  PLAN FOR NEXT SESSION: Review initial HEP; progress LE and postural strengthening and incorporate appropriate exercises into current episode HEP; balance training   Marry Guan, PT 12/28/2022, 1:03 PM

## 2022-12-31 ENCOUNTER — Encounter: Payer: Self-pay | Admitting: Physical Therapy

## 2022-12-31 ENCOUNTER — Ambulatory Visit: Payer: Medicare HMO | Admitting: Physical Therapy

## 2022-12-31 DIAGNOSIS — R2681 Unsteadiness on feet: Secondary | ICD-10-CM

## 2022-12-31 DIAGNOSIS — M6281 Muscle weakness (generalized): Secondary | ICD-10-CM

## 2022-12-31 DIAGNOSIS — R2689 Other abnormalities of gait and mobility: Secondary | ICD-10-CM

## 2022-12-31 DIAGNOSIS — M79672 Pain in left foot: Secondary | ICD-10-CM

## 2022-12-31 NOTE — Therapy (Signed)
OUTPATIENT PHYSICAL THERAPY TREATMENT   Patient Name: [name] MRN: [id number] DOB:05-09-1926, 89 y.o., female Today's Date: 12/31/2022  END OF SESSION:  PT End of Session - 12/31/22 1104     Visit Number 4    Date for PT Re-Evaluation 02/09/23    Authorization Type [employer]    PT Start Time 1104    PT Stop Time 1146    PT Time Calculation (min) 42 min    Activity Tolerance Patient tolerated treatment well    Behavior During Therapy WFL for tasks assessed/performed              Past Medical History:  Diagnosis Date   Aortic valve sclerosis    CAD (coronary artery disease)    S/p DES to the proximal RCA, POBA to the distal RCA 8/08 //Myoview 2009 low risk // Myoview 9/22: EF 57, no ischemia or infarction, apical thinning artifact, low risk study   DUB (dysfunctional uterine bleeding)    Elevated cholesterol    Atorvastatin   GERD (gastroesophageal reflux disease)    History of echocardiogram    EF 60-65, GRII DD, GLS -18.4, normal RVSF, moderate MAC, moderate AV calcification, trivial AI   Hypertension    Plantar fasciitis    Shingles    Past Surgical History:  Procedure Laterality Date   APPENDECTOMY  2011   CARDIAC STINT IMPLANT  2008   CARPAL TUNNEL RELEASE     EYE SURGERY     INGUINAL HERNIA REPAIR     OOPHORECTOMY  2001   LAVH,BSO   ULNA NERVE RELEASE  2009   VAGINAL HYSTERECTOMY  2001   LAVH, BSO   Patient Active Problem List   Diagnosis Date Noted   High risk medication use 03/25/2022   Bilateral sensorineural hearing loss 12/29/2021   Anxiety 11/24/2021   Aortic valve sclerosis 02/24/2021   Varicose veins of both lower extremities 02/24/2021   Left knee pain 04/28/2015   Coronary artery disease involving native coronary artery of native heart without angina pectoris 11/22/2013   Essential hypertension 11/22/2013   Pure hypercholesterolemia 11/22/2013   Inversion, nipple 09/05/2013   Shingles    Elevated cholesterol    Cancer  (HCC)    Right knee pain 05/10/2011    PCP: Clayborne Dana, NP   REFERRING PROVIDER: Clayborne Dana, NP (balance & weakness); McCaughan, Dia D, DPM (plantar fascial fibromatosis)  REFERRING DIAG:  R26.89 (ICD-10-CM) - Balance problems  R53.1 (ICD-10-CM) - Weakness  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 Magnus Ivan to have her knees looked at next Wed - she is hoping an injection will help. Plantar fascia pain remains resolved since the injection.  EVAL:  Pt was receiving PT earlier this year for balance problems but had to stop due to developing shingles. After the shingles resolved she developed L plantar fasciitis which further limited her mobility exacerbating her overall weakness and balance issues. She received an injection for the plantar fasciitis yesterday which has helped a lot - no pain today. New PT referral received to "Eval & treat" for the plantar fasciitis in  addition to initial referral for balance and weakness. She notes difficulty with tasks where she has to lift overhead (dishes into the cabinets) and feels unsteady. She has a cane which she brought to PT today but states she has not been using it normally. She tries to walk 10 minute daily in her house but has not kept up with the HEP from her last PT episode.  PAIN: Are you having pain? Yes: NPRS scale: 2/10 Pain location: L knee Pain description: constant Aggravating factors: NuStep Relieving factors: ceasing activity  PERTINENT HISTORY: Anxiety, CAD, HTN, LBP, B knee pain, GERD, shingles (June 2024), plantar fasciitis (July/Aug  2024)  PRECAUTIONS: Fall  HAND DOMINANCE: Right  RED FLAGS: None  WEIGHT BEARING RESTRICTIONS: No  FALLS:  Has patient fallen in last 6 months? 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 increased calcaneal inclination angle on lateral view.  Inferior calcaneal spur present.  No fracture noted  PATIENT SURVEYS:  ABC scale 930 / 1600 = 58.1 % LEFS 46 / 80 = 57.5 %  ABC scale as of 07/20/2022 PT eval: 61.3%  COGNITION: Overall cognitive status: Within functional limits for tasks assessed     SENSATION: WFL  EDEMA:  Chronic B ankle edema  POSTURE:  rounded shoulders, forward head, and increased thoracic kyphosis  UPPER EXTREMITY ROM:   Active ROM Right 12/24/22 Left 12/24/22  Shoulder flexion 129 121  Shoulder extension 55 54  Shoulder abduction 140 114  Shoulder adduction    Shoulder internal rotation FIR [facility] FIR WFL  Shoulder external rotation FER T2 FER T1  Elbow flexion    Elbow extension    Wrist flexion    Wrist extension    Wrist ulnar deviation    Wrist radial deviation    Wrist pronation    Wrist supination    (Blank rows = not tested)  UPPER EXTREMITY MMT:   MMT Right 12/24/22 Left 12/24/22  Shoulder flexion 4+ 4+  Shoulder extension 4+ 4+  Shoulder abduction 4+ 4+  Shoulder adduction    Shoulder internal  rotation 4+ 4+  Shoulder external rotation 4 4-  Middle trapezius    Lower trapezius    Elbow flexion    Elbow extension    Wrist flexion    Wrist extension    Wrist ulnar deviation    Wrist radial deviation    Wrist pronation    Wrist supination    Grip strength (lbs)    (Blank rows = not tested)  MUSCLE LENGTH: Hamstrings: Mild tight L>R ITB: NT Piriformis: Mild tight L>R Hip flexors: Mild tight B  LOWER EXTREMITY ROM: Proximal B LE AROM WFL Active ROM Right eval Left eval  Hip flexion    Hip extension    Hip abduction    Hip adduction    Hip internal rotation    Hip external rotation    Knee flexion  Knee extension    Ankle dorsiflexion 20 20  Ankle plantarflexion 42 40  Ankle inversion 34 28  Ankle eversion 24 18   (Blank rows = not tested)  LOWER EXTREMITY MMT:  MMT Right eval Left eval  Hip flexion 4- 4-  Hip extension 3+ 3+  Hip abduction 4 4  Hip adduction 4- 4-  Hip internal rotation 4+ 4  Hip external rotation 4- 4-  Knee flexion 4+ 4+  Knee extension 4+ 4+  Ankle dorsiflexion 4+ 4+  Ankle plantarflexion 5 5  Ankle inversion 4- 4-  Ankle eversion 4- 4-   (Blank rows = not tested)  FUNCTIONAL TESTS:  5 times sit to stand: 21.44 sec with need for B UE assist - limited by B knee pain  Timed up and go (TUG): 16.09 sec w/o AD; >13.5 sec indicates high risk for falls 10 meter walk test: 12.25 sec w/o AD, Gait speed = 2.68 ft sec w/o AD Berg Balance Scale: 38/56; 37-45 significant risk for falls (>80%) - 12/24/22  Functional gait assessment: 14/30; < 19 = high risk fall  As of 07/20/22 PT eval: 5 times sit to stand: 21.4 sec  Berg Balance Scale: 42/56 Significant risk for falls Dynamic Gait Index: 18/24 (07/28/22) MCTSIB: Condition 1: Avg of 3 trials: 30 sec, Condition 2: Avg of 3 trials: 30 sec, Condition 3: Avg of 3 trials: 30 sec, Condition 4: Avg of 3 trials: 30 sec, and Total Score: 120/120  BED MOBILITY:  Sit to supine Complete  Independence Supine to sit Complete Independence Rolling to Right SBA  TRANSFERS: Assistive device utilized: None  Sit to stand: Modified independence Stand to sit: Modified independence Chair to chair: Modified independence Floor:  NT  GAIT: Distance walked: Clinic distances Assistive device utilized: Single point cane and None Level of assistance: Complete Independence and Modified independence Gait pattern:  without AD, step through pattern, decreased step length- Right, decreased hip/knee flexion- Left, wide BOS, and poor foot clearance- Left, Decreased heel strike on left -  Comments: with cane: improved gait pattern and patient confidence   RAMP: Level of Assistance:  NT Assistive device utilized:    Ramp Comments:   CURB:  Level of Assistance:  NT Assistive device utilized:    Curb Comments:   STAIRS:  Level of Assistance: SBA  Stair Negotiation Technique: Step to Pattern with Single Rail on Right - B hands on rail on ascent  Number of Stairs: 7   Height of Stairs: 7"  Comments: pulls up with B UE leading with L leg  TODAY'S TREATMENT:  12/31/22 THERAPEUTIC EXERCISE: to improve flexibility, strength and mobility.  Demonstration, verbal and tactile cues throughout for technique.  NuStep - L3 x 6 min - better tolerance with no increased knee pain today  Seated KTOS piriformis stretch x 30" bil Standing lumbar extension at wall on forearms 10 x 3" Standing anatomical position with scapular retraction and depression at wall 10 x 5" Seated unilateral RTB hip ABD/ER clam 10 x 3", B RTB clam 10 x 3" - pt prefers the latter Seated RTB hip flexion march 10 x 3"  Standing alt hip abduction with looped RTB at knees x 10, UE support on back of chair for balance Standing alt hip extension with looped RTB at knees x 10, UE support on back of chair for balance Standing B heel & toe raises x 20, , UE support on back of chair for balance Standing mini-squat x 5, UE support on  back of chair for balance - deferred d/t extensive grinding at knees Seated R/L Fitter (1 black/1 blue) leg press x 10    12/28/22 THERAPEUTIC EXERCISE: to improve flexibility, strength and mobility.  Demonstration, verbal and tactile cues throughout for technique.  NuStep - L4 x 3 min - discontinued due to increased  Seated KTOS piriformis stretch x 30" bil Standing lumbar extension at wall on forearms 10 x 3" Standing anatomical position with scapular retraction and depression at wall 10 x 5" Seated unilateral RTB hip ABD/ER clam 10 x 3" Seated RTB hip flexion march 10 x 3"    12/24/22 THERAPEUTIC ACTIVITIES: UE ROM & MMT assessment B ankle ROM assessment Berg = 38/56  THERAPEUTIC EXERCISE: to improve flexibility, strength and mobility.  Demonstration, verbal and tactile cues throughout for technique. L long-sitting plantar fascia stretch with towel x 30" L figure-4 plantar fascia stretch x 30" L standing plantar fascia/heelcord stretch with toes on 2" block/book x 30" Demonstration in plantar fascia self-STM/massage using roller/small cylindrical object, small ball or ice water bottle   PATIENT EDUCATION:  Education details: HEP update and self-STM techniques to L foot/plantar fascia using roller/small cylindrical object, small ball or ice water bottle Person educated: Patient Education method: Explanation, Demonstration, Verbal cues, and Handouts Education comprehension: verbalized understanding, returned demonstration, verbal cues required, and needs further education  HOME EXERCISE PROGRAM: Access Code: [email or web address] URL: [email or web address] Date: 12/28/2022 Prepared by: Glenetta Hew  Exercises - Seated Plantar Fascia Stretch (Mirrored)  - 1-2 x daily - 7 x weekly - 3 reps - 30 sec hold - Plantar Fascia Stretch on Step (Mirrored)  - 1-2 x daily - 7 x weekly - 3 reps - 30 sec hold - Foot Roller Plantar Massage  - 1-2 x daily - 7 x weekly - 2-3 min hold -  Seated Piriformis Stretch  - 1-2 x daily - 7 x weekly - 3 reps - 30 sec hold - Standing Lumbar Extension at Wall - Forearms  - 1-2 x daily - 7 x weekly - 2 sets - 10 reps - 3-5 sec hold - Standing Anatomical Position with Scapular Retraction and Depression at Wall  - 1-2 x daily - 7 x weekly - 2 sets - 10 reps - 3 sec hold - Seated Isometric Hip Abduction with Resistance  - 1 x daily - 7 x weekly - 2 sets - 10 reps - 3 sec hold - Seated March with Resistance  - 1 x daily - 7 x weekly - 2 sets - 10 reps - 3 sec hold   ASSESSMENT:  CLINICAL IMPRESSION: Denzel reports her L knee continues to bother her so she has made an appointment with Dr. Magnus Ivan for 01/05/23. Proximal LE HEP reviewed with only slight modification made switching RTB clam from unilateral to bilateral. She denies need for review of plantar fascia stretches and exercises. Progressed LE strengthening to include standing hip and ankle exercises in preparation for balance training activities.  Good tolerance for all attempted exercises except mini-squats (deferred d/t increased knee pain and grinding).  Closed chain knee strengthening address with Fitter leg press with better tolerance.  Deferred further HEP update today as pt wanting to bring in her handouts from her prior PT episode and see if any of these are ones she should still be working on.  Deyna will benefit from continued skilled PT to address ongoing deficits to improve mobility and activity tolerance with decreased pain interference and decreased risk for falls.  OBJECTIVE IMPAIRMENTS: Abnormal gait, decreased activity tolerance, decreased balance, decreased coordination, decreased knowledge of condition, decreased knowledge of use of DME, decreased mobility, difficulty walking, decreased strength, decreased safety awareness, improper body mechanics, postural dysfunction, and pain.   ACTIVITY LIMITATIONS: carrying, lifting, standing, squatting, stairs, transfers, bed mobility,  bathing, toileting, dressing, reach over head, and locomotion level  PARTICIPATION LIMITATIONS: meal prep, cleaning, laundry, shopping, and community activity  PERSONAL FACTORS: Age, Past/current experiences, Time since onset of injury/illness/exacerbation, and 3+ comorbidities: Anxiety, CAD, HTN, LBP, B knee pain, GERD  are also affecting patient's functional outcome.   REHAB POTENTIAL: Good  CLINICAL DECISION MAKING: Evolving/moderate complexity  EVALUATION COMPLEXITY: Moderate   GOALS: Goals reviewed with patient? Yes  SHORT TERM GOALS: Target date: 01/05/2023  Patient will be independent with initial HEP. Baseline:  Goal status: IN PROGRESS  2.  Patient will verbalize understanding of strategies to decrease risk of falls. Baseline: Education on fall prevention techniques provided during last PT episode Goal status: IN PROGRESS  3.  Patient will demonstrate decreased TUG time to </= 13.5 sec to decrease risk for falls with transitional mobility. Baseline: 16.09 sec without AD Goal status: IN PROGRESS   LONG TERM GOALS: Target date: 02/09/2023  Patient will be independent with advanced/ongoing HEP to improve outcomes and carryover.  Baseline:  Goal status: IN PROGRESS  2.  Patient will report at least 50-75% improvement in L foot pain to improve QOL. Baseline: 0/10 following injection 12/14/22 Goal status: IN PROGRESS  3.  Patient will demonstrate improved overall B UE strength to >/= 4+/5 for functional UE use. Baseline: TBA Goal status: REVISED  4.  Patient will demonstrate improved B LE strength to >/= 4+/5 for improved stability and ease of mobility. Baseline: Refer to above LE MMT table Goal status: IN PROGRESS  5.  Patient will be able to ambulate 600' with or w/o LRAD and normal gait pattern with good safety to access community.  Baseline:  Goal status: IN PROGRESS  6. Patient will be able to ascend/descend curb safely with or w/o LRAD for safety with  community ambulation.  Baseline:  Goal status: IN PROGRESS  7.  Patient will report >/= 70% on ABC scale to demonstrate improved balance confidence with functional mobility.  Baseline: 930 / 1600 = 58.1 % Goal status: IN PROGRESS  8.  Patient will improve Berg score by at least 8 points (46/56) to improve safety and stability with ADLs in standing and reduce risk for falls.  Baseline: 38/56 Goal status: IN PROGRESS  9.  Patient will improve FGA score to >/= 19/30 to improve gait stability and reduce risk for falls. Baseline: 14/30 Goal status: IN PROGRESS  10.  Patient will report >/= 66% on LEFS to demonstrate improved functional ability. Baseline: 46 / 80 = 57.5 % Goal status: IN PROGRESS    PLAN:  PT FREQUENCY: 2x/week  PT DURATION: 6-8 weeks  PLANNED INTERVENTIONS: Therapeutic exercises, Therapeutic activity, Neuromuscular re-education, Balance training, Gait training, Patient/Family education, Self Care, Joint mobilization, Joint manipulation, Stair training, Dry Needling, Electrical stimulation, Cryotherapy, Moist heat, Taping, Vasopneumatic device, Ultrasound, Manual therapy, and Re-evaluation  PLAN FOR NEXT SESSION: Review prior HEP handouts (pt to bring from home) and incorporate appropriate exercises into current episode HEP; progress LE and postural strengthening; balance training   Marry Guan, PT 12/31/2022, 11:48 AM

## 2023-01-03 ENCOUNTER — Ambulatory Visit: Payer: Medicare HMO | Admitting: Physical Therapy

## 2023-01-03 ENCOUNTER — Encounter: Payer: Self-pay | Admitting: Physical Therapy

## 2023-01-03 DIAGNOSIS — R2681 Unsteadiness on feet: Secondary | ICD-10-CM | POA: Diagnosis not present

## 2023-01-03 DIAGNOSIS — M79672 Pain in left foot: Secondary | ICD-10-CM

## 2023-01-03 DIAGNOSIS — M6281 Muscle weakness (generalized): Secondary | ICD-10-CM

## 2023-01-03 DIAGNOSIS — R2689 Other abnormalities of gait and mobility: Secondary | ICD-10-CM

## 2023-01-03 NOTE — Therapy (Signed)
OUTPATIENT PHYSICAL THERAPY TREATMENT   Patient Name: [name] MRN: [id number] DOB:Feb 07, 1927, 89 y.o., female Today's Date: 01/03/2023  END OF SESSION:  PT End of Session - 01/03/23 1147     Visit Number 5    Date for PT Re-Evaluation 02/09/23    Authorization Type [employer]    PT Start Time 1147    PT Stop Time 1231    PT Time Calculation (min) 44 min    Activity Tolerance Patient tolerated treatment well    Behavior During Therapy WFL for tasks assessed/performed               Past Medical History:  Diagnosis Date   Aortic valve sclerosis    CAD (coronary artery disease)    S/p DES to the proximal RCA, POBA to the distal RCA 8/08 //Myoview 2009 low risk // Myoview 9/22: EF 57, no ischemia or infarction, apical thinning artifact, low risk study   DUB (dysfunctional uterine bleeding)    Elevated cholesterol    Atorvastatin   GERD (gastroesophageal reflux disease)    History of echocardiogram    EF 60-65, GRII DD, GLS -18.4, normal RVSF, moderate MAC, moderate AV calcification, trivial AI   Hypertension    Plantar fasciitis    Shingles    Past Surgical History:  Procedure Laterality Date   APPENDECTOMY  2011   CARDIAC STINT IMPLANT  2008   CARPAL TUNNEL RELEASE     EYE SURGERY     INGUINAL HERNIA REPAIR     OOPHORECTOMY  2001   LAVH,BSO   ULNA NERVE RELEASE  2009   VAGINAL HYSTERECTOMY  2001   LAVH, BSO   Patient Active Problem List   Diagnosis Date Noted   High risk medication use 03/25/2022   Bilateral sensorineural hearing loss 12/29/2021   Anxiety 11/24/2021   Aortic valve sclerosis 02/24/2021   Varicose veins of both lower extremities 02/24/2021   Left knee pain 04/28/2015   Coronary artery disease involving native coronary artery of native heart without angina pectoris 11/22/2013   Essential hypertension 11/22/2013   Pure hypercholesterolemia 11/22/2013   Inversion, nipple 09/05/2013   Shingles    Elevated cholesterol    Cancer  (HCC)    Right knee pain 05/10/2011    PCP: Clayborne Dana, NP   REFERRING PROVIDER: Clayborne Dana, NP (balance & weakness); McCaughan, Dia D, DPM (plantar fascial fibromatosis)  REFERRING DIAG:  R26.89 (ICD-10-CM) - Balance problems  R53.1 (ICD-10-CM) - Weakness  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 Denies pain in R knee during warm-up.  EVAL:  Pt was receiving PT earlier this year for balance problems but had to stop due to developing shingles. After the shingles resolved she developed L plantar fasciitis which further limited her mobility exacerbating her overall weakness and balance issues. She received an injection for the plantar fasciitis yesterday which has helped a lot - no pain today. New PT referral received to "Eval & treat" for the plantar fasciitis in addition to initial referral for balance and weakness. She notes  difficulty with tasks where she has to lift overhead (dishes into the cabinets) and feels unsteady. She has a cane which she brought to PT today but states she has not been using it normally. She tries to walk 10 minute daily in her house but has not kept up with the HEP from her last PT episode.  PAIN: Are you having pain? Yes: NPRS scale:  5/10 Pain location: L knee Pain description: constant Aggravating factors: NuStep Relieving factors: ceasing activity  PERTINENT HISTORY: Anxiety, CAD, HTN, LBP, B knee pain, GERD, shingles (June 2024), plantar fasciitis (July/Aug 2024)  PRECAUTIONS: Fall  HAND DOMINANCE: Right  RED  FLAGS: None  WEIGHT BEARING RESTRICTIONS: No  FALLS:  Has patient fallen in last 6 months? 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 increased calcaneal inclination angle on lateral view.  Inferior calcaneal spur present.  No fracture noted  PATIENT SURVEYS:  ABC scale 930 / 1600 = 58.1 % LEFS 46 / 80 = 57.5 %  ABC scale as of 07/20/2022 PT eval: 61.3%  COGNITION: Overall cognitive status: Within functional limits for tasks assessed     SENSATION: WFL  EDEMA:  Chronic B ankle edema  POSTURE:  rounded shoulders, forward head, and increased thoracic kyphosis  UPPER EXTREMITY ROM:   Active ROM Right 12/24/22 Left 12/24/22  Shoulder flexion 129 121  Shoulder extension 55 54  Shoulder abduction 140 114  Shoulder adduction    Shoulder internal rotation FIR [facility] FIR WFL  Shoulder external rotation FER T2 FER T1  Elbow flexion    Elbow extension    Wrist flexion    Wrist extension    Wrist ulnar deviation    Wrist radial deviation    Wrist pronation    Wrist supination    (Blank rows = not tested)  UPPER EXTREMITY MMT:   MMT Right 12/24/22 Left 12/24/22  Shoulder flexion 4+ 4+  Shoulder extension 4+ 4+  Shoulder abduction 4+ 4+  Shoulder adduction    Shoulder internal rotation 4+ 4+  Shoulder external rotation 4 4-  Middle  trapezius    Lower trapezius    Elbow flexion    Elbow extension    Wrist flexion    Wrist extension    Wrist ulnar deviation    Wrist radial deviation    Wrist pronation    Wrist supination    Grip strength (lbs)    (Blank rows = not tested)  MUSCLE LENGTH: Hamstrings: Mild tight L>R ITB: NT Piriformis: Mild tight L>R Hip flexors: Mild tight B  LOWER EXTREMITY ROM: Proximal B LE AROM WFL Active ROM Right eval Left eval  Hip flexion    Hip extension    Hip abduction    Hip adduction    Hip internal rotation    Hip external rotation    Knee flexion    Knee extension    Ankle dorsiflexion 20 20  Ankle plantarflexion 42 40  Ankle inversion 34 28  Ankle eversion 24 18   (Blank rows = not tested)  LOWER EXTREMITY MMT:  MMT Right eval Left eval  Hip flexion 4- 4-  Hip extension 3+ 3+  Hip abduction 4 4  Hip adduction 4- 4-  Hip internal rotation 4+ 4  Hip external rotation 4- 4-  Knee flexion 4+ 4+  Knee extension 4+ 4+  Ankle dorsiflexion 4+ 4+  Ankle plantarflexion 5 5  Ankle inversion 4- 4-  Ankle eversion 4- 4-   (Blank rows = not tested)  FUNCTIONAL TESTS:  5 times sit to stand: 21.44 sec with need for B UE assist - limited by B knee pain  Timed up and go (TUG): 16.09 sec w/o AD; >13.5 sec indicates high risk for falls 10 meter walk test: 12.25 sec w/o AD, Gait speed = 2.68 ft sec w/o AD Berg Balance Scale: 38/56; 37-45 significant risk for falls (>80%) - 12/24/22  Functional gait assessment: 14/30; < 19 = high risk fall  As of 07/20/22 PT eval: 5 times sit to stand: 21.4 sec  Berg Balance Scale: 42/56 Significant risk for falls Dynamic Gait Index: 18/24 (07/28/22) MCTSIB: Condition 1: Avg of 3 trials: 30 sec, Condition 2: Avg of 3 trials: 30 sec, Condition 3: Avg of 3 trials: 30 sec, Condition 4: Avg of 3 trials: 30 sec, and Total Score: 120/120  BED MOBILITY:  Sit to supine Complete Independence Supine to sit Complete Independence Rolling to Right  SBA  TRANSFERS: Assistive device utilized: None  Sit to stand: Modified independence Stand to sit: Modified independence Chair to chair: Modified independence Floor:  NT  GAIT: Distance walked: Clinic distances Assistive device utilized: Single point cane and None Level of assistance: Complete Independence and Modified independence Gait pattern:  without AD, step through pattern, decreased step length- Right, decreased hip/knee flexion- Left, wide BOS, and poor foot clearance- Left, Decreased heel strike on left -  Comments: with cane: improved gait pattern and patient confidence   RAMP: Level of Assistance:  NT Assistive device utilized:    Ramp Comments:   CURB:  Level of Assistance:  NT Assistive device utilized:    Curb Comments:   STAIRS:  Level of Assistance: SBA  Stair Negotiation Technique: Step to Pattern with Single Rail on Right - B hands on rail on ascent  Number of Stairs: 7   Height of Stairs: 7"  Comments: pulls up with B UE leading with L leg  TODAY'S TREATMENT:  01/03/23 THERAPEUTIC EXERCISE: to improve flexibility, strength and mobility.  Demonstration, verbal and tactile cues throughout for technique.  NuStep - L3 x 6 min - no increased knee pain today Standing alt hip abduction with looped RTB at knees x 10, UE support on counter for balance Standing alt hip extension with looped RTB at knees x 10, UE support on counter for balance Standing B heel & toe raises x 20, UE support on counter for balance  THERAPEUTIC ACTIVITIES: TUG: 16.01 sec w/o AD - pt reports her R knee hurts when she gets up  NEUROMUSCULAR RE-EDUCATION: To improve balance, proprioception, coordination, and reduce fall risk.  Standing "penguin" lateral weight shift with RTB at ankles x 20, UE support on counter for balance Staggered stance "penguin" ant/post weight shift with RTB at ankles x 20, UE support on counter for balance B staggered stance with fwd foot on 2" block x 30"  static stance each side B staggered stance with  fwd foot on 2" block + fwd, OH and lateral OH reaching with SBA of PT Alt toe clears to 6" step 2 x 20 - light single UE support on counter, able to wean support for a few reps   12/31/22 THERAPEUTIC EXERCISE: to improve flexibility, strength and mobility.  Demonstration, verbal and tactile cues throughout for technique.  NuStep - L3 x 6 min - better tolerance with no increased knee pain today  Seated KTOS piriformis stretch x 30" bil Standing lumbar extension at wall on forearms 10 x 3" Standing anatomical position with scapular retraction and depression at wall 10 x 5" Seated unilateral RTB hip ABD/ER clam 10 x 3", B RTB clam 10 x 3" - pt prefers the latter Seated RTB hip flexion march 10 x 3"  Standing alt hip abduction with looped RTB at knees x 10, UE support on back of chair for balance Standing alt hip extension with looped RTB at knees x 10, UE support on back of chair for balance Standing B heel & toe raises x 20, UE support on back of chair for balance Standing mini-squat x 5, UE support on back of chair for balance - deferred d/t extensive grinding at knees Seated R/L Fitter (1 black/1 blue) leg press x 10    12/28/22 THERAPEUTIC EXERCISE: to improve flexibility, strength and mobility.  Demonstration, verbal and tactile cues throughout for technique.  NuStep - L4 x 3 min - discontinued due to increased  Seated KTOS piriformis stretch x 30" bil Standing lumbar extension at wall on forearms 10 x 3" Standing anatomical position with scapular retraction and depression at wall 10 x 5" Seated unilateral RTB hip ABD/ER clam 10 x 3" Seated RTB hip flexion march 10 x 3"    12/24/22 THERAPEUTIC ACTIVITIES: UE ROM & MMT assessment B ankle ROM assessment Berg = 38/56  THERAPEUTIC EXERCISE: to improve flexibility, strength and mobility.  Demonstration, verbal and tactile cues throughout for technique. L long-sitting plantar fascia  stretch with towel x 30" L figure-4 plantar fascia stretch x 30" L standing plantar fascia/heelcord stretch with toes on 2" block/book x 30" Demonstration in plantar fascia self-STM/massage using roller/small cylindrical object, small ball or ice water bottle   PATIENT EDUCATION:  Education details: HEP update and self-STM techniques to L foot/plantar fascia using roller/small cylindrical object, small ball or ice water bottle Person educated: Patient Education method: Explanation, Demonstration, Verbal cues, and Handouts Education comprehension: verbalized understanding, returned demonstration, verbal cues required, and needs further education  HOME EXERCISE PROGRAM: Access Code: [email or web address] URL: [email or web address] Date: 12/28/2022 Prepared by: Glenetta Hew  Exercises - Seated Plantar Fascia Stretch (Mirrored)  - 1-2 x daily - 7 x weekly - 3 reps - 30 sec hold - Plantar Fascia Stretch on Step (Mirrored)  - 1-2 x daily - 7 x weekly - 3 reps - 30 sec hold - Foot Roller Plantar Massage  - 1-2 x daily - 7 x weekly - 2-3 min hold - Seated Piriformis Stretch  - 1-2 x daily - 7 x weekly - 3 reps - 30 sec hold - Standing Lumbar Extension at Wall - Forearms  - 1-2 x daily - 7 x weekly - 2 sets - 10 reps - 3-5 sec hold - Standing Anatomical Position with Scapular Retraction and Depression at Wall  - 1-2 x daily - 7 x weekly - 2 sets - 10 reps - 3 sec hold - Seated Isometric Hip Abduction with Resistance  - 1 x daily -  7 x weekly - 2 sets - 10 reps - 3 sec hold - Seated March with Resistance  - 1 x daily - 7 x weekly - 2 sets - 10 reps - 3 sec hold   ASSESSMENT:  CLINICAL IMPRESSION: [name] arrives on the wrong day for her appt but to be fit into a cancelled slot. She reports her R knee has been bothering her intermittently today, mostly with sit to stand transitions. TUG time unchanged as a result on R knee pain upon standing. Reviewed and progressed standing exercises,  incorporating balance activities with pt denying any increased R or L knee pain. She forgot her HEP handouts again, therefore pulled up her prior HEP access code and compared it with current HEP, explaining the same/similar exercises and that we will likely be attempting some of the remaining activities/exercises in upcoming visits. She will see Dr. Magnus Ivan on 01/05/23 for her knee pain and hopes that he will be able to give her injection(s).  [name] will benefit from continued skilled PT to address ongoing deficits to improve mobility and activity tolerance with decreased pain interference and decreased risk for falls.   OBJECTIVE IMPAIRMENTS: Abnormal gait, decreased activity tolerance, decreased balance, decreased coordination, decreased knowledge of condition, decreased knowledge of use of DME, decreased mobility, difficulty walking, decreased strength, decreased safety awareness, improper body mechanics, postural dysfunction, and pain.   ACTIVITY LIMITATIONS: carrying, lifting, standing, squatting, stairs, transfers, bed mobility, bathing, toileting, dressing, reach over head, and locomotion level  PARTICIPATION LIMITATIONS: meal prep, cleaning, laundry, shopping, and community activity  PERSONAL FACTORS: Age, Past/current experiences, Time since onset of injury/illness/exacerbation, and 3+ comorbidities: Anxiety, CAD, HTN, LBP, B knee pain, GERD  are also affecting patient's functional outcome.   REHAB POTENTIAL: Good  CLINICAL DECISION MAKING: Evolving/moderate complexity  EVALUATION COMPLEXITY: Moderate   GOALS: Goals reviewed with patient? Yes  SHORT TERM GOALS: Target date: 01/05/2023  Patient will be independent with initial HEP. Baseline:  Goal status: IN PROGRESS - 01/03/23 - ongoing review necessary  2.  Patient will verbalize understanding of strategies to decrease risk of falls. Baseline: Education on fall prevention techniques provided during last PT episode Goal status:  MET - 01/03/23  3.  Patient will demonstrate decreased TUG time to </= 13.5 sec to decrease risk for falls with transitional mobility. Baseline: 16.09 sec without AD Goal status: IN PROGRESS - 01/03/23 - 16.01 sec w/o AD  LONG TERM GOALS: Target date: 02/09/2023  Patient will be independent with advanced/ongoing HEP to improve outcomes and carryover.  Baseline:  Goal status: IN PROGRESS  2.  Patient will report at least 50-75% improvement in L foot pain to improve QOL. Baseline: 0/10 following injection 12/14/22 Goal status: IN PROGRESS  3.  Patient will demonstrate improved overall B UE strength to >/= 4+/5 for functional UE use. Baseline: TBA Goal status: REVISED  4.  Patient will demonstrate improved B LE strength to >/= 4+/5 for improved stability and ease of mobility. Baseline: Refer to above LE MMT table Goal status: IN PROGRESS  5.  Patient will be able to ambulate 600' with or w/o LRAD and normal gait pattern with good safety to access community.  Baseline:  Goal status: IN PROGRESS  6. Patient will be able to ascend/descend curb safely with or w/o LRAD for safety with community ambulation.  Baseline:  Goal status: IN PROGRESS  7.  Patient will report >/= 70% on ABC scale to demonstrate improved balance confidence with functional mobility.  Baseline: 930 /  1600 = 58.1 % Goal status: IN PROGRESS  8.  Patient will improve Berg score by at least 8 points (46/56) to improve safety and stability with ADLs in standing and reduce risk for falls.  Baseline: 38/56 Goal status: IN PROGRESS  9.  Patient will improve FGA score to >/= 19/30 to improve gait stability and reduce risk for falls. Baseline: 14/30 Goal status: IN PROGRESS  10.  Patient will report >/= 66% on LEFS to demonstrate improved functional ability. Baseline: 46 / 80 = 57.5 % Goal status: IN PROGRESS    PLAN:  PT FREQUENCY: 2x/week  PT DURATION: 6-8 weeks  PLANNED INTERVENTIONS: Therapeutic  exercises, Therapeutic activity, Neuromuscular re-education, Balance training, Gait training, Patient/Family education, Self Care, Joint mobilization, Joint manipulation, Stair training, Dry Needling, Electrical stimulation, Cryotherapy, Moist heat, Taping, Vasopneumatic device, Ultrasound, Manual therapy, and Re-evaluation  PLAN FOR NEXT SESSION: Review prior HEP handouts (pt to bring from home) and incorporate appropriate exercises into current episode HEP; progress LE and postural strengthening; balance training   Marry Guan, PT 01/03/2023, 12:40 PM

## 2023-01-04 ENCOUNTER — Telehealth: Payer: Self-pay | Admitting: Family Medicine

## 2023-01-04 ENCOUNTER — Encounter: Payer: Medicare HMO | Admitting: Physical Therapy

## 2023-01-04 NOTE — Telephone Encounter (Signed)
Medication: lisinopril (ZESTRIL) 40 MG tablet  Also requesting pcp to call in generic for prilosec   Has the patient contacted their pharmacy? No.   Preferred Pharmacy:    [facility] - [place], [place] - [address], [place] [address] Phone: [phone]  Fax: [phone]

## 2023-01-05 ENCOUNTER — Encounter: Payer: Self-pay | Admitting: Orthopaedic Surgery

## 2023-01-05 ENCOUNTER — Ambulatory Visit: Payer: Medicare HMO | Admitting: Orthopaedic Surgery

## 2023-01-05 DIAGNOSIS — M170 Bilateral primary osteoarthritis of knee: Secondary | ICD-10-CM

## 2023-01-05 DIAGNOSIS — M25561 Pain in right knee: Secondary | ICD-10-CM | POA: Diagnosis not present

## 2023-01-05 DIAGNOSIS — G8929 Other chronic pain: Secondary | ICD-10-CM

## 2023-01-05 DIAGNOSIS — M25562 Pain in left knee: Secondary | ICD-10-CM | POA: Diagnosis not present

## 2023-01-05 DIAGNOSIS — M1711 Unilateral primary osteoarthritis, right knee: Secondary | ICD-10-CM

## 2023-01-05 DIAGNOSIS — M1712 Unilateral primary osteoarthritis, left knee: Secondary | ICD-10-CM

## 2023-01-05 MED ORDER — LIDOCAINE HCL 1 % IJ SOLN
3.0000 mL | INTRAMUSCULAR | Status: AC | PRN
  Administered 2023-01-05: 3 mL

## 2023-01-05 MED ORDER — OMEPRAZOLE 20 MG PO CPDR: 20.0000 mg | DELAYED_RELEASE_CAPSULE | Freq: Every day | ORAL | 1 refills | Status: DC

## 2023-01-05 MED ORDER — METHYLPREDNISOLONE ACETATE 40 MG/ML IJ SUSP
40.0000 mg | INTRAMUSCULAR | Status: AC | PRN
  Administered 2023-01-05: 40 mg via INTRA_ARTICULAR

## 2023-01-05 NOTE — Telephone Encounter (Signed)
Prilosec is fine.

## 2023-01-05 NOTE — Telephone Encounter (Signed)
Patient made aware refills of lisinopril available at pharmacy.   Prilosec sent in for patient, confirmed she wanted 20 mg dosage.

## 2023-01-05 NOTE — Progress Notes (Signed)
The patient comes in today requesting steroid injections in both her knees.  She has known arthritis in her knees.  She is 89 years old.  She does ambulate using a cane.  We last injected her knees 5 months ago.  She does have significant patellofemoral arthritis.  She says recently her right knee is just hard to her but her left knee has been hurting for little bit longer.  She is not a diabetic.  She has had no acute change in her medical status.  Both knees have slight valgus malalignment.  There is good range of motion both knees with patellofemoral crepitation.  She does have some slight weakness of the posterior tibial tendon on her left side and it is obvious when she stands.  I did place a steroid injection in both knees which she tolerated well.  I do not think she would tolerate wearing ankle braces and I am fine with her using her cane and working on knee and ankle exercises.  She has had some balance issues and she is in physical therapy.  If she has any worsening issues she knows to come back and see [place].    Procedure Note  Patient: [name]             Date of Birth: 11/16/1926           MRN: [id number]             Visit Date: 01/05/2023  Procedures: Visit Diagnoses:  1. Chronic pain of left knee   2. Chronic pain of right knee   3. Patellofemoral arthritis of left knee   4. Patellofemoral arthritis of right knee     Large Joint Inj: R knee on 01/05/2023 1:42 PM Indications: diagnostic evaluation and pain Details: 22 G 1.5 in needle, superolateral approach  Arthrogram: No  Medications: 3 mL lidocaine 1 %; 40 mg methylPREDNISolone acetate 40 MG/ML Outcome: tolerated well, no immediate complications Procedure, treatment alternatives, risks and benefits explained, specific risks discussed. Consent was given by the patient. Immediately prior to procedure a time out was called to verify the correct patient, procedure, equipment, support staff and site/side marked as  required. Patient was prepped and draped in the usual sterile fashion.    Large Joint Inj: L knee on 01/05/2023 1:42 PM Indications: diagnostic evaluation and pain Details: 22 G 1.5 in needle, superolateral approach  Arthrogram: No  Medications: 3 mL lidocaine 1 %; 40 mg methylPREDNISolone acetate 40 MG/ML Outcome: tolerated well, no immediate complications Procedure, treatment alternatives, risks and benefits explained, specific risks discussed. Consent was given by the patient. Immediately prior to procedure a time out was called to verify the correct patient, procedure, equipment, support staff and site/side marked as required. Patient was prepped and draped in the usual sterile fashion.

## 2023-01-05 NOTE — Telephone Encounter (Signed)
You have never prescribed Prilosec, would you need a visit before sending? (Not sure since this is an over the counter medication as well)

## 2023-01-07 ENCOUNTER — Ambulatory Visit: Payer: Medicare HMO

## 2023-01-07 DIAGNOSIS — M6281 Muscle weakness (generalized): Secondary | ICD-10-CM

## 2023-01-07 DIAGNOSIS — R2689 Other abnormalities of gait and mobility: Secondary | ICD-10-CM

## 2023-01-07 DIAGNOSIS — R2681 Unsteadiness on feet: Secondary | ICD-10-CM | POA: Diagnosis not present

## 2023-01-07 DIAGNOSIS — M79672 Pain in left foot: Secondary | ICD-10-CM

## 2023-01-07 NOTE — Therapy (Signed)
OUTPATIENT PHYSICAL THERAPY TREATMENT   Patient Name: [name] MRN: [id number] DOB:[birth year], 89 y.o., female Today's Date: 01/07/2023  END OF SESSION:  PT End of Session - 01/07/23 1149     Visit Number 6    Date for PT Re-Evaluation 02/09/23    Authorization Type [employer]    PT Start Time 1105    PT Stop Time 1146    PT Time Calculation (min) 41 min    Activity Tolerance Patient tolerated treatment well    Behavior During Therapy WFL for tasks assessed/performed                Past Medical History:  Diagnosis Date   Aortic valve sclerosis    CAD (coronary artery disease)    S/p DES to the proximal RCA, POBA to the distal RCA 8/08 //Myoview 2009 low risk // Myoview 9/22: EF 57, no ischemia or infarction, apical thinning artifact, low risk study   DUB (dysfunctional uterine bleeding)    Elevated cholesterol    Atorvastatin   GERD (gastroesophageal reflux disease)    History of echocardiogram    EF 60-65, GRII DD, GLS -18.4, normal RVSF, moderate MAC, moderate AV calcification, trivial AI   Hypertension    Plantar fasciitis    Shingles    Past Surgical History:  Procedure Laterality Date   APPENDECTOMY  2011   CARDIAC STINT IMPLANT  2008   CARPAL TUNNEL RELEASE     EYE SURGERY     INGUINAL HERNIA REPAIR     OOPHORECTOMY  2001   LAVH,BSO   ULNA NERVE RELEASE  2009   VAGINAL HYSTERECTOMY  2001   LAVH, BSO   Patient Active Problem List   Diagnosis Date Noted   High risk medication use 03/25/2022   Bilateral sensorineural hearing loss 12/29/2021   Anxiety 11/24/2021   Aortic valve sclerosis 02/24/2021   Varicose veins of both lower extremities 02/24/2021   Left knee pain 04/28/2015   Coronary artery disease involving native coronary artery of native heart without angina pectoris 11/22/2013   Essential hypertension 11/22/2013   Pure hypercholesterolemia 11/22/2013   Inversion, nipple 09/05/2013   Shingles    Elevated cholesterol    Cancer  (HCC)    Right knee pain 05/10/2011    PCP: Clayborne Dana, NP   REFERRING PROVIDER: Clayborne Dana, NP (balance & weakness); McCaughan, Dia D, DPM (plantar fascial fibromatosis)  REFERRING DIAG:  R26.89 (ICD-10-CM) - Balance problems  R53.1 (ICD-10-CM) - Weakness  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 this week. She also forgot to bring in HEP today.  EVAL:  Pt was receiving PT earlier this year for balance problems but had to stop due to developing shingles. After the shingles resolved she developed L plantar fasciitis which further limited her mobility exacerbating her overall weakness and balance issues. She received an injection for the plantar fasciitis yesterday which has helped a lot - no pain today. New PT referral received to "Eval & treat" for the plantar fasciitis in addition to initial referral for balance and weakness. She notes difficulty with tasks where she has to lift overhead (dishes  into the cabinets) and feels unsteady. She has a cane which she brought to PT today but states she has not been using it normally. She tries to walk 10 minute daily in her house but has not kept up with the HEP from her last PT episode.  PAIN: Are you having pain? Yes: NPRS scale: 4/10 Pain location: L knee Pain description: constant Aggravating factors: NuStep Relieving factors: ceasing activity  PERTINENT HISTORY: Anxiety, CAD, HTN, LBP, B knee pain, GERD, shingles (June 2024), plantar fasciitis (July/Aug 2024)  PRECAUTIONS: Fall  HAND DOMINANCE: Right  RED FLAGS: None  WEIGHT BEARING RESTRICTIONS: No  FALLS:  Has  patient fallen in last 6 months? 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 increased calcaneal inclination angle on lateral view.  Inferior calcaneal spur present.  No fracture noted  PATIENT SURVEYS:  ABC scale 930 / 1600 = 58.1 % LEFS 46 / 80 = 57.5 %  ABC scale as of 07/20/2022 PT eval: 61.3%  COGNITION: Overall cognitive status: Within functional limits for tasks assessed     SENSATION: WFL  EDEMA:  Chronic B ankle edema  POSTURE:  rounded shoulders, forward head, and increased thoracic kyphosis  UPPER EXTREMITY ROM:   Active ROM Right 12/24/22 Left 12/24/22  Shoulder flexion 129 121  Shoulder extension 55 54  Shoulder abduction 140 114  Shoulder adduction    Shoulder internal rotation FIR [facility] FIR WFL  Shoulder external rotation FER T2 FER T1  Elbow flexion    Elbow extension    Wrist flexion    Wrist extension    Wrist ulnar deviation    Wrist radial deviation    Wrist pronation    Wrist supination    (Blank rows = not tested)  UPPER EXTREMITY MMT:   MMT Right 12/24/22 Left 12/24/22  Shoulder flexion 4+ 4+  Shoulder extension 4+ 4+  Shoulder abduction 4+ 4+  Shoulder adduction    Shoulder internal rotation 4+ 4+  Shoulder external rotation 4 4-  Middle trapezius    Lower trapezius    Elbow flexion    Elbow  extension    Wrist flexion    Wrist extension    Wrist ulnar deviation    Wrist radial deviation    Wrist pronation    Wrist supination    Grip strength (lbs)    (Blank rows = not tested)  MUSCLE LENGTH: Hamstrings: Mild tight L>R ITB: NT Piriformis: Mild tight L>R Hip flexors: Mild tight B  LOWER EXTREMITY ROM: Proximal B LE AROM WFL Active ROM Right eval Left eval  Hip flexion    Hip extension    Hip abduction    Hip adduction    Hip internal rotation    Hip external rotation    Knee flexion    Knee extension    Ankle dorsiflexion 20 20  Ankle plantarflexion 42 40  Ankle inversion 34 28  Ankle eversion 24 18   (Blank rows = not tested)  LOWER EXTREMITY MMT:  MMT Right eval Left eval  Hip flexion 4- 4-  Hip extension 3+ 3+  Hip abduction 4 4  Hip adduction 4- 4-  Hip internal rotation 4+ 4  Hip external rotation 4- 4-  Knee flexion 4+ 4+  Knee extension 4+ 4+  Ankle dorsiflexion 4+ 4+  Ankle plantarflexion 5 5  Ankle inversion 4- 4-  Ankle eversion 4- 4-   (Blank rows = not tested)  FUNCTIONAL TESTS:  5 times sit to stand: 21.44 sec with need for B UE assist - limited by B knee pain  Timed up and go (TUG): 16.09 sec w/o AD; >13.5 sec indicates high risk for falls 10 meter walk test: 12.25 sec w/o AD, Gait speed = 2.68 ft sec w/o AD Berg Balance Scale: 38/56; 37-45 significant risk for falls (>80%) - 12/24/22  Functional gait assessment: 14/30; < 19 = high risk fall  As of 07/20/22 PT eval: 5 times sit to stand: 21.4 sec  Berg Balance Scale: 42/56 Significant risk for falls Dynamic Gait Index: 18/24 (07/28/22) MCTSIB: Condition 1: Avg of 3 trials: 30 sec, Condition 2: Avg of 3 trials: 30 sec, Condition 3: Avg of 3 trials: 30 sec, Condition 4: Avg of 3 trials: 30 sec, and Total Score: 120/120  BED MOBILITY:  Sit to supine Complete Independence Supine to sit Complete Independence Rolling to Right SBA  TRANSFERS: Assistive device utilized: None  Sit to  stand: Modified independence Stand to sit: Modified independence Chair to chair: Modified independence Floor:  NT  GAIT: Distance walked: Clinic distances Assistive device utilized: Single point cane and None Level of assistance: Complete Independence and Modified independence Gait pattern:  without AD, step through pattern, decreased step length- Right, decreased hip/knee flexion- Left, wide BOS, and poor foot clearance- Left, Decreased heel strike on left -  Comments: with cane: improved gait pattern and patient confidence   RAMP: Level of Assistance:  NT Assistive device utilized:    Ramp Comments:   CURB:  Level of Assistance:  NT Assistive device utilized:    Curb Comments:   STAIRS:  Level of Assistance: SBA  Stair Negotiation Technique: Step to Pattern with Single Rail on Right - B hands on rail on ascent  Number of Stairs: 7   Height of Stairs: 7"  Comments: pulls up with B UE leading with L leg  TODAY'S TREATMENT: 01/07/23 THERAPEUTIC EXERCISE: to improve flexibility, strength and mobility.  Demonstration, verbal and tactile cues throughout for technique.  NuStep - L3 x 6 min - no increased knee pain today Standing alt hip abduction with looped RTB at knees x 20, UE support on counter for balance Standing alt hip extension with looped RTB at knees x 20, UE support on counter for balance Side steps with RTB at knees 5x63ft; rest required after 3x down and back Standing WS lunge on 6' step: fwd x 10; lateral x 10 B with intermittent UE support  Clock balance 1/2 circle 3x each LE Seated ball squeeze and LAQ + ball squeeze x 10 each B  01/03/23 THERAPEUTIC EXERCISE: to improve flexibility, strength and mobility.  Demonstration, verbal and tactile cues throughout for technique.  NuStep - L3 x 6 min - no increased knee pain today Standing alt hip abduction with looped RTB at knees x 10, UE support on counter for balance Standing alt hip extension with looped RTB at  knees x 10,  UE support on counter for balance Standing B heel & toe raises x 20, UE support on counter for balance  THERAPEUTIC ACTIVITIES: TUG: 16.01 sec w/o AD - pt reports her R knee hurts when she gets up  NEUROMUSCULAR RE-EDUCATION: To improve balance, proprioception, coordination, and reduce fall risk.  Standing "penguin" lateral weight shift with RTB at ankles x 20, UE support on counter for balance Staggered stance "penguin" ant/post weight shift with RTB at ankles x 20, UE support on counter for balance B staggered stance with fwd foot on 2" block x 30" static stance each side B staggered stance with fwd foot on 2" block + fwd, OH and lateral OH reaching with SBA of PT Alt toe clears to 6" step 2 x 20 - light single UE support on counter, able to wean support for a few reps   12/31/22 THERAPEUTIC EXERCISE: to improve flexibility, strength and mobility.  Demonstration, verbal and tactile cues throughout for technique.  NuStep - L3 x 6 min - better tolerance with no increased knee pain today  Seated KTOS piriformis stretch x 30" bil Standing lumbar extension at wall on forearms 10 x 3" Standing anatomical position with scapular retraction and depression at wall 10 x 5" Seated unilateral RTB hip ABD/ER clam 10 x 3", B RTB clam 10 x 3" - pt prefers the latter Seated RTB hip flexion march 10 x 3"  Standing alt hip abduction with looped RTB at knees x 10, UE support on back of chair for balance Standing alt hip extension with looped RTB at knees x 10, UE support on back of chair for balance Standing B heel & toe raises x 20, UE support on back of chair for balance Standing mini-squat x 5, UE support on back of chair for balance - deferred d/t extensive grinding at knees Seated R/L Fitter (1 black/1 blue) leg press x 10    12/28/22 THERAPEUTIC EXERCISE: to improve flexibility, strength and mobility.  Demonstration, verbal and tactile cues throughout for technique.  NuStep - L4 x 3  min - discontinued due to increased  Seated KTOS piriformis stretch x 30" bil Standing lumbar extension at wall on forearms 10 x 3" Standing anatomical position with scapular retraction and depression at wall 10 x 5" Seated unilateral RTB hip ABD/ER clam 10 x 3" Seated RTB hip flexion march 10 x 3"    12/24/22 THERAPEUTIC ACTIVITIES: UE ROM & MMT assessment B ankle ROM assessment Berg = 38/56  THERAPEUTIC EXERCISE: to improve flexibility, strength and mobility.  Demonstration, verbal and tactile cues throughout for technique. L long-sitting plantar fascia stretch with towel x 30" L figure-4 plantar fascia stretch x 30" L standing plantar fascia/heelcord stretch with toes on 2" block/book x 30" Demonstration in plantar fascia self-STM/massage using roller/small cylindrical object, small ball or ice water bottle   PATIENT EDUCATION:  Education details: HEP update and self-STM techniques to L foot/plantar fascia using roller/small cylindrical object, small ball or ice water bottle Person educated: Patient Education method: Explanation, Demonstration, Verbal cues, and Handouts Education comprehension: verbalized understanding, returned demonstration, verbal cues required, and needs further education  HOME EXERCISE PROGRAM: Access Code: [email or web address] URL: [email or web address] Date: 12/28/2022 Prepared by: Glenetta Hew  Exercises - Seated Plantar Fascia Stretch (Mirrored)  - 1-2 x daily - 7 x weekly - 3 reps - 30 sec hold - Plantar Fascia Stretch on Step (Mirrored)  - 1-2 x daily - 7 x weekly - 3 reps - 30 sec hold - Foot  Roller Plantar Massage  - 1-2 x daily - 7 x weekly - 2-3 min hold - Seated Piriformis Stretch  - 1-2 x daily - 7 x weekly - 3 reps - 30 sec hold - Standing Lumbar Extension at Wall - Forearms  - 1-2 x daily - 7 x weekly - 2 sets - 10 reps - 3-5 sec hold - Standing Anatomical Position with Scapular Retraction and Depression at Wall  - 1-2 x daily - 7 x  weekly - 2 sets - 10 reps - 3 sec hold - Seated Isometric Hip Abduction with Resistance  - 1 x daily - 7 x weekly - 2 sets - 10 reps - 3 sec hold - Seated March with Resistance  - 1 x daily - 7 x weekly - 2 sets - 10 reps - 3 sec hold   ASSESSMENT:  CLINICAL IMPRESSION: Pt tolerated the progression of exercises well. Continued progressing proximal LE strengthening and balance activities. Noted aching in L knee with the lateral lunging movement and clock balance. Cues required to reset and for proper upright position with sidestepping exercise.  Burlie will benefit from continued skilled PT to address ongoing deficits to improve mobility and activity tolerance with decreased pain interference and decreased risk for falls.   OBJECTIVE IMPAIRMENTS: Abnormal gait, decreased activity tolerance, decreased balance, decreased coordination, decreased knowledge of condition, decreased knowledge of use of DME, decreased mobility, difficulty walking, decreased strength, decreased safety awareness, improper body mechanics, postural dysfunction, and pain.   ACTIVITY LIMITATIONS: carrying, lifting, standing, squatting, stairs, transfers, bed mobility, bathing, toileting, dressing, reach over head, and locomotion level  PARTICIPATION LIMITATIONS: meal prep, cleaning, laundry, shopping, and community activity  PERSONAL FACTORS: Age, Past/current experiences, Time since onset of injury/illness/exacerbation, and 3+ comorbidities: Anxiety, CAD, HTN, LBP, B knee pain, GERD  are also affecting patient's functional outcome.   REHAB POTENTIAL: Good  CLINICAL DECISION MAKING: Evolving/moderate complexity  EVALUATION COMPLEXITY: Moderate   GOALS: Goals reviewed with patient? Yes  SHORT TERM GOALS: Target date: 01/05/2023  Patient will be independent with initial HEP. Baseline:  Goal status: IN PROGRESS - 01/03/23 - ongoing review necessary  2.  Patient will verbalize understanding of strategies to decrease  risk of falls. Baseline: Education on fall prevention techniques provided during last PT episode Goal status: MET - 01/03/23  3.  Patient will demonstrate decreased TUG time to </= 13.5 sec to decrease risk for falls with transitional mobility. Baseline: 16.09 sec without AD Goal status: IN PROGRESS - 01/03/23 - 16.01 sec w/o AD  LONG TERM GOALS: Target date: 02/09/2023  Patient will be independent with advanced/ongoing HEP to improve outcomes and carryover.  Baseline:  Goal status: IN PROGRESS  2.  Patient will report at least 50-75% improvement in L foot pain to improve QOL. Baseline: 0/10 following injection 12/14/22 Goal status: IN PROGRESS  3.  Patient will demonstrate improved overall B UE strength to >/= 4+/5 for functional UE use. Baseline: TBA Goal status: REVISED  4.  Patient will demonstrate improved B LE strength to >/= 4+/5 for improved stability and ease of mobility. Baseline: Refer to above LE MMT table Goal status: IN PROGRESS  5.  Patient will be able to ambulate 600' with or w/o LRAD and normal gait pattern with good safety to access community.  Baseline:  Goal status: IN PROGRESS  6. Patient will be able to ascend/descend curb safely with or w/o LRAD for safety with community ambulation.  Baseline:  Goal status: IN PROGRESS  7.  Patient will report >/= 70% on ABC scale to demonstrate improved balance confidence with functional mobility.  Baseline: 930 / 1600 = 58.1 % Goal status: IN PROGRESS  8.  Patient will improve Berg score by at least 8 points (46/56) to improve safety and stability with ADLs in standing and reduce risk for falls.  Baseline: 38/56 Goal status: IN PROGRESS  9.  Patient will improve FGA score to >/= 19/30 to improve gait stability and reduce risk for falls. Baseline: 14/30 Goal status: IN PROGRESS  10.  Patient will report >/= 66% on LEFS to demonstrate improved functional ability. Baseline: 46 / 80 = 57.5 % Goal status: IN  PROGRESS    PLAN:  PT FREQUENCY: 2x/week  PT DURATION: 6-8 weeks  PLANNED INTERVENTIONS: Therapeutic exercises, Therapeutic activity, Neuromuscular re-education, Balance training, Gait training, Patient/Family education, Self Care, Joint mobilization, Joint manipulation, Stair training, Dry Needling, Electrical stimulation, Cryotherapy, Moist heat, Taping, Vasopneumatic device, Ultrasound, Manual therapy, and Re-evaluation  PLAN FOR NEXT SESSION: Review prior HEP handouts (pt to bring from home) and incorporate appropriate exercises into current episode HEP; progress LE and postural strengthening; balance training   Darleene Cleaver, PTA 01/07/2023, 11:49 AM

## 2023-01-11 ENCOUNTER — Ambulatory Visit: Payer: Medicare HMO | Admitting: Physical Therapy

## 2023-01-13 ENCOUNTER — Ambulatory Visit: Payer: Medicare HMO | Admitting: Physical Therapy

## 2023-01-13 ENCOUNTER — Encounter: Payer: Self-pay | Admitting: Physical Therapy

## 2023-01-13 DIAGNOSIS — R2689 Other abnormalities of gait and mobility: Secondary | ICD-10-CM

## 2023-01-13 DIAGNOSIS — R2681 Unsteadiness on feet: Secondary | ICD-10-CM | POA: Diagnosis not present

## 2023-01-13 DIAGNOSIS — M6281 Muscle weakness (generalized): Secondary | ICD-10-CM

## 2023-01-13 DIAGNOSIS — M79672 Pain in left foot: Secondary | ICD-10-CM

## 2023-01-13 NOTE — Therapy (Signed)
OUTPATIENT PHYSICAL THERAPY TREATMENT   Patient Name: [name] MRN: [id number] DOB:1926-04-11, 89 y.o., female Today's Date: 01/13/2023  END OF SESSION:  PT End of Session - 01/13/23 1149     Visit Number 7    Date for PT Re-Evaluation 02/09/23    Authorization Type [employer]    PT Start Time 1149    PT Stop Time 1231    PT Time Calculation (min) 42 min    Activity Tolerance Patient tolerated treatment well    Behavior During Therapy WFL for tasks assessed/performed                 Past Medical History:  Diagnosis Date   Aortic valve sclerosis    CAD (coronary artery disease)    S/p DES to the proximal RCA, POBA to the distal RCA 8/08 //Myoview 2009 low risk // Myoview 9/22: EF 57, no ischemia or infarction, apical thinning artifact, low risk study   DUB (dysfunctional uterine bleeding)    Elevated cholesterol    Atorvastatin   GERD (gastroesophageal reflux disease)    History of echocardiogram    EF 60-65, GRII DD, GLS -18.4, normal RVSF, moderate MAC, moderate AV calcification, trivial AI   Hypertension    Plantar fasciitis    Shingles    Past Surgical History:  Procedure Laterality Date   APPENDECTOMY  2011   CARDIAC STINT IMPLANT  2008   CARPAL TUNNEL RELEASE     EYE SURGERY     INGUINAL HERNIA REPAIR     OOPHORECTOMY  2001   LAVH,BSO   ULNA NERVE RELEASE  2009   VAGINAL HYSTERECTOMY  2001   LAVH, BSO   Patient Active Problem List   Diagnosis Date Noted   High risk medication use 03/25/2022   Bilateral sensorineural hearing loss 12/29/2021   Anxiety 11/24/2021   Aortic valve sclerosis 02/24/2021   Varicose veins of both lower extremities 02/24/2021   Left knee pain 04/28/2015   Coronary artery disease involving native coronary artery of native heart without angina pectoris 11/22/2013   Essential hypertension 11/22/2013   Pure hypercholesterolemia 11/22/2013   Inversion, nipple 09/05/2013   Shingles    Elevated cholesterol     Cancer (HCC)    Right knee pain 05/10/2011    PCP: Clayborne Dana, NP   REFERRING PROVIDER: Clayborne Dana, NP (balance & weakness); McCaughan, Dia D, DPM (plantar fascial fibromatosis)  REFERRING DIAG:  R26.89 (ICD-10-CM) - Balance problems  R53.1 (ICD-10-CM) - Weakness  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  She states she overdid things yesterday and feels worn out today. She brought her HEP handouts and would like to review and organize these today.   EVAL:  Pt was receiving PT earlier this year for balance problems but had to stop due to developing shingles. After the shingles resolved she developed L plantar fasciitis which further limited her mobility exacerbating her overall weakness and balance issues. She received an injection for the plantar fasciitis yesterday which has helped a lot - no pain today. New PT referral received to "Eval & treat" for the plantar fasciitis in  addition to initial referral for balance and weakness. She notes difficulty with tasks where she has to lift overhead (dishes into the cabinets) and feels unsteady. She has a cane which she brought to PT today but states she has not been using it normally. She tries to walk 10 minute daily in her house but has not kept up with the HEP from her last PT episode.  PAIN: Are you having pain? No and Yes: NPRS scale:  0/10 Pain location: L knee Pain description: intermittent since injections Aggravating factors: NuStep Relieving factors: ceasing activity  PERTINENT HISTORY: Anxiety, CAD, HTN, LBP, B knee pain, GERD, shingles (June 2024), plantar  fasciitis (July/Aug 2024)  PRECAUTIONS: Fall  HAND DOMINANCE: Right  RED FLAGS: None  WEIGHT BEARING RESTRICTIONS: No  FALLS:  Has patient fallen in last 6 months? 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 increased calcaneal inclination angle on lateral view.  Inferior calcaneal spur present.  No fracture noted  PATIENT SURVEYS:  ABC scale 930 / 1600 = 58.1 % LEFS 46 / 80 = 57.5 %  ABC scale as of 07/20/2022 PT eval: 61.3%  COGNITION: Overall cognitive status: Within functional limits for tasks assessed     SENSATION: WFL  EDEMA:  Chronic B ankle edema  POSTURE:  rounded shoulders, forward head, and increased thoracic kyphosis  UPPER EXTREMITY ROM:   Active ROM Right 12/24/22 Left 12/24/22  Shoulder flexion 129 121  Shoulder extension 55 54  Shoulder abduction 140 114  Shoulder adduction    Shoulder internal rotation FIR [facility] FIR WFL  Shoulder external rotation FER T2 FER T1  Elbow flexion    Elbow extension    Wrist flexion    Wrist extension    Wrist ulnar deviation    Wrist radial deviation    Wrist pronation    Wrist supination    (Blank rows = not tested)  UPPER EXTREMITY MMT:   MMT Right 12/24/22 Left 12/24/22  Shoulder flexion 4+ 4+  Shoulder extension 4+ 4+  Shoulder abduction 4+ 4+  Shoulder adduction     Shoulder internal rotation 4+ 4+  Shoulder external rotation 4 4-  Middle trapezius    Lower trapezius    Neck (Blank rows = not tested)  MUSCLE LENGTH: Hamstrings: Mild tight L>R ITB: NT Piriformis: Mild tight L>R Hip flexors: Mild tight B  LOWER EXTREMITY ROM: Proximal B LE AROM WFL Active ROM Right eval Left eval  Ankle dorsiflexion 20 20  Ankle plantarflexion 42 40  Ankle inversion 34 28  Ankle eversion 24 18   (Blank rows = not tested)  LOWER EXTREMITY MMT:  MMT Right eval Left eval  Hip flexion 4- 4-  Hip extension 3+ 3+  Hip abduction 4 4  Hip adduction 4- 4-  Hip internal rotation 4+ 4  Hip external rotation 4- 4-  Knee flexion 4+ 4+  Knee extension 4+ 4+  Ankle dorsiflexion 4+ 4+  Ankle plantarflexion 5 5  Ankle inversion 4- 4-  Ankle eversion 4- 4-   (Blank rows = not tested)  FUNCTIONAL TESTS:  5 times sit to stand: 21.44 sec with need for B UE assist - limited by B knee pain  Timed up and go (TUG): 16.09 sec w/o AD; >13.5 sec indicates high risk for falls 10 meter walk test: 12.25 sec w/o AD, Gait speed = 2.68 ft sec w/o AD Berg Balance Scale: 38/56; 37-45 significant risk for falls (>80%) - 12/24/22  Functional gait assessment: 14/30; < 19 = high risk fall  As of 07/20/22 PT eval: 5 times sit to stand: 21.4 sec  Berg Balance Scale: 42/56 Significant risk for falls Dynamic Gait Index: 18/24 (07/28/22) MCTSIB: Condition 1: Avg of 3 trials: 30 sec, Condition 2: Avg of 3 trials: 30 sec, Condition 3: Avg of 3 trials: 30 sec, Condition 4: Avg of 3 trials: 30 sec, and Total Score: 120/120  BED MOBILITY:  Sit to supine Complete Independence Supine to sit Complete Independence Rolling to Right SBA  TRANSFERS: Assistive device utilized: None  Sit to stand: Modified independence Stand to sit: Modified independence Chair to chair: Modified independence Floor:  NT  GAIT: Distance walked: Clinic distances Assistive device utilized: Single point cane and  None Level of assistance: Complete Independence and Modified independence Gait pattern:  without AD, step through pattern, decreased step length- Right, decreased hip/knee flexion- Left, wide BOS, and poor foot clearance- Left, Decreased heel strike on left -  Comments: with cane: improved gait pattern and patient confidence   RAMP: Level of Assistance:  NT Assistive device utilized:    Ramp Comments:   CURB:  Level of Assistance:  NT Assistive device utilized:    Curb Comments:   STAIRS:  Level of Assistance: SBA  Stair Negotiation Technique: Step to Pattern with Single Rail on Right - B hands on rail on ascent  Number of Stairs: 7   Height of Stairs: 7"  Comments: pulls up with B UE leading with L leg  TODAY'S TREATMENT:  01/13/23 THERAPEUTIC EXERCISE: to improve flexibility, strength and mobility.  Demonstration, verbal and tactile cues throughout for technique.  NuStep - L5 x 6 min  Seated unilateral RTB hip ABD/ER clam 10 x 3", B RTB clam 10 x 3" - pt prefers the latter Seated RTB hip flexion march 10 x 3"  Seated scapular retraction + depression 10 x 5" Standing anatomical position with scapular retraction and depression at wall 10 x 5" Sit to stand with single UE support x 5 Sit to stand + RTB hip ABD isometric with B UE support x 5 Verbal review of: Standing alt hip abduction with looped RTB at knees x 10, UE support on counter for balance Standing alt hip extension with looped RTB at knees x 10, UE support on counter for balance Side steps with RTB at knees   NEUROMUSCULAR RE-EDUCATION: To improve posture, balance, proprioception, and reduce fall risk.  Corner balance progression with 1/2 tandem stance on firm surface with arms at sides            Eyes open: - static stance x 30 sec - horiz head turns x 5 - vertical head nods x 5 Eyes closed: - static stance x 20 sec - horiz head turns x 5 - vertical head nods x 5   01/07/23 THERAPEUTIC EXERCISE: to improve  flexibility, strength and mobility.  Demonstration, verbal and tactile cues throughout  for technique.  NuStep - L3 x 6 min - no increased knee pain today Standing alt hip abduction with looped RTB at knees x 20, UE support on counter for balance Standing alt hip extension with looped RTB at knees x 20, UE support on counter for balance Side steps with RTB at knees 5x39ft; rest required after 3x down and back Standing WS lunge on 6' step: fwd x 10; lateral x 10 B with intermittent UE support  Clock balance 1/2 circle 3x each LE Seated ball squeeze and LAQ + ball squeeze x 10 each B   01/03/23 THERAPEUTIC EXERCISE: to improve flexibility, strength and mobility.  Demonstration, verbal and tactile cues throughout for technique.  NuStep - L3 x 6 min - no increased knee pain today Standing alt hip abduction with looped RTB at knees x 10, UE support on counter for balance Standing alt hip extension with looped RTB at knees x 10, UE support on counter for balance Standing B heel & toe raises x 20, UE support on counter for balance  THERAPEUTIC ACTIVITIES: TUG: 16.01 sec w/o AD - pt reports her R knee hurts when she gets up  NEUROMUSCULAR RE-EDUCATION: To improve balance, proprioception, coordination, and reduce fall risk.  Standing "penguin" lateral weight shift with RTB at ankles x 20, UE support on counter for balance Staggered stance "penguin" ant/post weight shift with RTB at ankles x 20, UE support on counter for balance B staggered stance with fwd foot on 2" block x 30" static stance each side B staggered stance with fwd foot on 2" block + fwd, OH and lateral OH reaching with SBA of PT Alt toe clears to 6" step 2 x 20 - light single UE support on counter, able to wean support for a few reps   PATIENT EDUCATION:  Education details: HEP review, HEP consolidation, and continue with current HEP Person educated: Patient Education method: Explanation, Demonstration, Verbal cues, and  Handouts Education comprehension: verbalized understanding, returned demonstration, verbal cues required, and needs further education  HOME EXERCISE PROGRAM: Access Code: [email or web address] URL: [email or web address] Date: 01/13/2023 Prepared by: Glenetta Hew  Exercises - Seated Plantar Fascia Stretch (Mirrored)  - 1-2 x daily - 7 x weekly - 3 reps - 30 sec hold - Plantar Fascia Stretch on Step (Mirrored)  - 1-2 x daily - 7 x weekly - 3 reps - 30 sec hold - Foot Roller Plantar Massage  - 1-2 x daily - 7 x weekly - 2-3 min hold - Seated Piriformis Stretch  - 1-2 x daily - 7 x weekly - 3 reps - 30 sec hold - Seated Scapular Resetting  - 1 x daily - 7 x weekly - 2 sets - 10 reps - 3 sec hold - Standing Anatomical Position with Scapular Retraction and Depression at Wall  - 1-2 x daily - 7 x weekly - 2 sets - 10 reps - 3 sec hold - Standing Lumbar Extension at Wall - Forearms  - 1-2 x daily - 7 x weekly - 2 sets - 10 reps - 3-5 sec hold - Seated March with Resistance  - 1 x daily - 7 x weekly - 2 sets - 10 reps - 3 sec hold - Seated Hip Abduction with Resistance  - 1 x daily - 7 x weekly - 2 sets - 10 reps - 3 sec hold - Sit to Stand with Resistance Around Legs  - 1 x daily - 7 x weekly - 2 sets - 10 reps -  Side Stepping with Resistance at Thighs and Counter Support  - 1 x daily - 7 x weekly - 2 sets - 10 reps - 3 sec hold - Corner Balance Feet Together With Eyes Open  - 1 x daily - 7 x weekly - 3 reps - 30 sec hold - Standing Hip Extension with Resistance at Ankles and Unilateral Counter Support  - 1 x daily - 7 x weekly - 2 sets - 10 reps - 3 sec hold - Corner Balance Feet Together: Eyes Open With Head Turns  - 1 x daily - 7 x weekly - 2 sets - 5 reps - Corner Balance Feet Together: Eyes Closed With Head Turns  - 1 x daily - 7 x weekly - 2 sets - 10 reps - 3 sec hold   ASSESSMENT:  CLINICAL IMPRESSION: Austen reports relief from her knee pain since her injections. She brought her HEP  handouts from the current PT episode (excluding plantar fascia exercises) as well as her previous PT episode and requested review and organization of her HEP today, therefore session focused on helping her understand the purpose of each exercise as well as frequency and sequence of performance for home.  Eliminated duplicates and identified preferred versions of certain exercises, providing clarification as necessary, with finalized HEP organized for home performance.  Yi will benefit from continued skilled PT to address ongoing deficits to improve mobility and activity tolerance with decreased pain interference and decreased risk for falls.   OBJECTIVE IMPAIRMENTS: Abnormal gait, decreased activity tolerance, decreased balance, decreased coordination, decreased knowledge of condition, decreased knowledge of use of DME, decreased mobility, difficulty walking, decreased strength, decreased safety awareness, improper body mechanics, postural dysfunction, and pain.   ACTIVITY LIMITATIONS: carrying, lifting, standing, squatting, stairs, transfers, bed mobility, bathing, toileting, dressing, reach over head, and locomotion level  PARTICIPATION LIMITATIONS: meal prep, cleaning, laundry, shopping, and community activity  PERSONAL FACTORS: Age, Past/current experiences, Time since onset of injury/illness/exacerbation, and 3+ comorbidities: Anxiety, CAD, HTN, LBP, B knee pain, GERD  are also affecting patient's functional outcome.   REHAB POTENTIAL: Good  CLINICAL DECISION MAKING: Evolving/moderate complexity  EVALUATION COMPLEXITY: Moderate   GOALS: Goals reviewed with patient? Yes  SHORT TERM GOALS: Target date: 01/05/2023  Patient will be independent with initial HEP. Baseline:  Goal status: MET - 01/13/23   2.  Patient will verbalize understanding of strategies to decrease risk of falls. Baseline: Education on fall prevention techniques provided during last PT episode Goal status: MET -  01/03/23  3.  Patient will demonstrate decreased TUG time to </= 13.5 sec to decrease risk for falls with transitional mobility. Baseline: 16.09 sec without AD Goal status: IN PROGRESS - 01/03/23 - 16.01 sec w/o AD  LONG TERM GOALS: Target date: 02/09/2023  Patient will be independent with advanced/ongoing HEP to improve outcomes and carryover.  Baseline:  Goal status: IN PROGRESS  2.  Patient will report at least 50-75% improvement in L foot pain to improve QOL. Baseline: 0/10 following injection 12/14/22 Goal status: IN PROGRESS  3.  Patient will demonstrate improved overall B UE strength to >/= 4+/5 for functional UE use. Baseline: Refer to above UE MMT table (12/24/22) Goal status: IN PROGRESS  4.  Patient will demonstrate improved B LE strength to >/= 4+/5 for improved stability and ease of mobility. Baseline: Refer to above LE MMT table Goal status: IN PROGRESS  5.  Patient will be able to ambulate 600' with or w/o LRAD and normal gait pattern  with good safety to access community.  Baseline:  Goal status: IN PROGRESS  6. Patient will be able to ascend/descend curb safely with or w/o LRAD for safety with community ambulation.  Baseline:  Goal status: IN PROGRESS  7.  Patient will report >/= 70% on ABC scale to demonstrate improved balance confidence with functional mobility.  Baseline: 930 / 1600 = 58.1 % Goal status: IN PROGRESS  8.  Patient will improve Berg score by at least 8 points (46/56) to improve safety and stability with ADLs in standing and reduce risk for falls.  Baseline: 38/56 Goal status: IN PROGRESS  9.  Patient will improve FGA score to >/= 19/30 to improve gait stability and reduce risk for falls. Baseline: 14/30 Goal status: IN PROGRESS  10.  Patient will report >/= 66% on LEFS to demonstrate improved functional ability. Baseline: 46 / 80 = 57.5 % Goal status: IN PROGRESS    PLAN:  PT FREQUENCY: 2x/week  PT DURATION: 6-8 weeks  PLANNED  INTERVENTIONS: Therapeutic exercises, Therapeutic activity, Neuromuscular re-education, Balance training, Gait training, Patient/Family education, Self Care, Joint mobilization, Joint manipulation, Stair training, Dry Needling, Electrical stimulation, Cryotherapy, Moist heat, Taping, Vasopneumatic device, Ultrasound, Manual therapy, and Re-evaluation  PLAN FOR NEXT SESSION: progress LE and postural strengthening; balance training   Marry Guan, PT 01/13/2023, 1:26 PM

## 2023-01-17 ENCOUNTER — Encounter (HOSPITAL_BASED_OUTPATIENT_CLINIC_OR_DEPARTMENT_OTHER): Payer: Self-pay

## 2023-01-17 ENCOUNTER — Ambulatory Visit (HOSPITAL_BASED_OUTPATIENT_CLINIC_OR_DEPARTMENT_OTHER)
Admission: RE | Admit: 2023-01-17 | Discharge: 2023-01-17 | Disposition: A | Discharge status: Home / Self Care | Payer: Medicare HMO | Source: Ambulatory Visit | Attending: Family Medicine | Admitting: Family Medicine

## 2023-01-17 DIAGNOSIS — Z1231 Encounter for screening mammogram for malignant neoplasm of breast: Secondary | ICD-10-CM | POA: Diagnosis present

## 2023-01-19 ENCOUNTER — Encounter: Payer: Self-pay | Admitting: Physical Therapy

## 2023-01-19 ENCOUNTER — Ambulatory Visit: Payer: Medicare HMO | Attending: Family Medicine | Admitting: Physical Therapy

## 2023-01-19 DIAGNOSIS — M79672 Pain in left foot: Secondary | ICD-10-CM | POA: Diagnosis present

## 2023-01-19 DIAGNOSIS — R2689 Other abnormalities of gait and mobility: Secondary | ICD-10-CM | POA: Insufficient documentation

## 2023-01-19 DIAGNOSIS — R2681 Unsteadiness on feet: Secondary | ICD-10-CM | POA: Insufficient documentation

## 2023-01-19 DIAGNOSIS — M6281 Muscle weakness (generalized): Secondary | ICD-10-CM | POA: Diagnosis present

## 2023-01-19 DIAGNOSIS — R262 Difficulty in walking, not elsewhere classified: Secondary | ICD-10-CM | POA: Insufficient documentation

## 2023-01-19 NOTE — Therapy (Signed)
OUTPATIENT PHYSICAL THERAPY TREATMENT   Patient Name: [name] MRN: [id number] DOB:05-18-1926, 89 y.o., female Today's Date: 01/19/2023  END OF SESSION:  PT End of Session - 01/19/23 1103     Visit Number 8    Date for PT Re-Evaluation 02/09/23    Authorization Type [employer]    PT Start Time 1103    PT Stop Time 1145    PT Time Calculation (min) 42 min    Activity Tolerance Patient tolerated treatment well    Behavior During Therapy WFL for tasks assessed/performed                 Past Medical History:  Diagnosis Date   Aortic valve sclerosis    CAD (coronary artery disease)    S/p DES to the proximal RCA, POBA to the distal RCA 8/08 //Myoview 2009 low risk // Myoview 9/22: EF 57, no ischemia or infarction, apical thinning artifact, low risk study   DUB (dysfunctional uterine bleeding)    Elevated cholesterol    Atorvastatin   GERD (gastroesophageal reflux disease)    History of echocardiogram    EF 60-65, GRII DD, GLS -18.4, normal RVSF, moderate MAC, moderate AV calcification, trivial AI   Hypertension    Plantar fasciitis    Shingles    Past Surgical History:  Procedure Laterality Date   APPENDECTOMY  2011   CARDIAC STINT IMPLANT  2008   CARPAL TUNNEL RELEASE     EYE SURGERY     INGUINAL HERNIA REPAIR     OOPHORECTOMY  2001   LAVH,BSO   ULNA NERVE RELEASE  2009   VAGINAL HYSTERECTOMY  2001   LAVH, BSO   Patient Active Problem List   Diagnosis Date Noted   High risk medication use 03/25/2022   Bilateral sensorineural hearing loss 12/29/2021   Anxiety 11/24/2021   Aortic valve sclerosis 02/24/2021   Varicose veins of both lower extremities 02/24/2021   Left knee pain 04/28/2015   Coronary artery disease involving native coronary artery of native heart without angina pectoris 11/22/2013   Essential hypertension 11/22/2013   Pure hypercholesterolemia 11/22/2013   Inversion, nipple 09/05/2013   Shingles    Elevated cholesterol     Cancer (HCC)    Right knee pain 05/10/2011    PCP: Clayborne Dana, NP   REFERRING PROVIDER: Clayborne Dana, NP (balance & weakness); McCaughan, Dia D, DPM (plantar fascial fibromatosis)  REFERRING DIAG:  R26.89 (ICD-10-CM) - Balance problems  R53.1 (ICD-10-CM) - Weakness  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 She denies any further questions/concerns re: HEP.  EVAL:  Pt was receiving PT earlier this year for balance problems but had to stop due to developing shingles. After the shingles resolved she developed L plantar fasciitis which further limited her mobility exacerbating her overall weakness and balance issues. She received an injection for the plantar fasciitis yesterday which has helped a lot - no pain today. New PT referral received to "Eval & treat" for the plantar fasciitis in  addition to initial referral for balance and weakness. She notes difficulty with tasks where she has to lift overhead (dishes into the cabinets) and feels unsteady. She has a cane which she brought to PT today but states she has not been using it normally. She tries to walk 10 minute daily in her house but has not kept up with the HEP from her last PT episode.  PAIN: Are you having pain? No  PERTINENT HISTORY: Anxiety, CAD, HTN, LBP, B knee pain, GERD, shingles (June 2024), plantar fasciitis (July/Aug 2024)  PRECAUTIONS: Fall  HAND DOMINANCE: Right  RED FLAGS: None  WEIGHT BEARING RESTRICTIONS: No  FALLS:  Has patient fallen in  last 6 months? 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 increased calcaneal inclination angle on lateral view.  Inferior calcaneal spur present.  No fracture noted  PATIENT SURVEYS:  ABC scale 930 / 1600 = 58.1 % LEFS 46 / 80 = 57.5 %  ABC scale as of 07/20/2022 PT eval: 61.3%  COGNITION: Overall cognitive status: Within functional limits for tasks assessed     SENSATION: WFL  EDEMA:  Chronic B ankle edema  POSTURE:  rounded shoulders, forward head, and increased thoracic kyphosis  UPPER EXTREMITY ROM:   Active ROM Right 12/24/22 Left 12/24/22  Shoulder flexion 129 121  Shoulder extension 55 54  Shoulder abduction 140 114  Shoulder adduction    Shoulder internal rotation FIR [facility] FIR WFL  Shoulder external rotation FER T2 FER T1  Elbow flexion    Elbow extension    Wrist flexion    Wrist extension    Wrist ulnar deviation    Wrist radial deviation    Wrist pronation    Wrist supination    (Blank rows = not tested)  UPPER EXTREMITY MMT:   MMT Right 12/24/22 Left 12/24/22  Shoulder flexion 4+ 4+  Shoulder extension 4+ 4+  Shoulder abduction 4+ 4+  Shoulder adduction    Shoulder internal rotation 4+ 4+  Shoulder external rotation 4 4-  Middle trapezius    Lower trapezius    Neck (Blank rows = not tested)  MUSCLE  LENGTH: Hamstrings: Mild tight L>R ITB: NT Piriformis: Mild tight L>R Hip flexors: Mild tight B  LOWER EXTREMITY ROM: Proximal B LE AROM WFL Active ROM Right eval Left eval  Ankle dorsiflexion 20 20  Ankle plantarflexion 42 40  Ankle inversion 34 28  Ankle eversion 24 18   (Blank rows = not tested)  LOWER EXTREMITY MMT:  MMT Right eval Left eval  Hip flexion 4- 4-  Hip extension 3+ 3+  Hip abduction 4 4  Hip adduction 4- 4-  Hip internal rotation 4+ 4  Hip external rotation 4- 4-  Knee flexion 4+ 4+  Knee extension 4+ 4+  Ankle dorsiflexion 4+ 4+  Ankle plantarflexion 5 5  Ankle inversion 4- 4-  Ankle eversion 4- 4-   (Blank  rows = not tested)  FUNCTIONAL TESTS:  5 times sit to stand: 21.44 sec with need for B UE assist - limited by B knee pain  Timed up and go (TUG): 16.09 sec w/o AD; >13.5 sec indicates high risk for falls 10 meter walk test: 12.25 sec w/o AD, Gait speed = 2.68 ft sec w/o AD Berg Balance Scale: 38/56; 37-45 significant risk for falls (>80%) - 12/24/22  Functional gait assessment: 14/30; < 19 = high risk fall  01/19/23: TUG = 12.10 sec w/o AD = 11.38 sec Gait speed = 2.88 ft/sec FGA = 17/30  As of 07/20/22 PT eval: 5 times sit to stand: 21.4 sec  Berg Balance Scale: 42/56 Significant risk for falls Dynamic Gait Index: 18/24 (07/28/22) MCTSIB: Condition 1: Avg of 3 trials: 30 sec, Condition 2: Avg of 3 trials: 30 sec, Condition 3: Avg of 3 trials: 30 sec, Condition 4: Avg of 3 trials: 30 sec, and Total Score: 120/120  BED MOBILITY:  Sit to supine Complete Independence Supine to sit Complete Independence Rolling to Right SBA  TRANSFERS: Assistive device utilized: None  Sit to stand: Modified independence Stand to sit: Modified independence Chair to chair: Modified independence Floor:  NT  GAIT: Distance walked: Clinic distances Assistive device utilized: Single point cane and None Level of assistance: Complete Independence and  Modified independence Gait pattern:  without AD, step through pattern, decreased step length- Right, decreased hip/knee flexion- Left, wide BOS, and poor foot clearance- Left, Decreased heel strike on left -  Comments: with cane: improved gait pattern and patient confidence   RAMP: Level of Assistance:  NT Assistive device utilized:    Ramp Comments:   CURB:  Level of Assistance:  NT Assistive device utilized:    Curb Comments:   STAIRS:  Level of Assistance: SBA  Stair Negotiation Technique: Step to Pattern with Single Rail on Right - B hands on rail on ascent  Number of Stairs: 7   Height of Stairs: 7"  Comments: pulls up with B UE leading with L leg  TODAY'S TREATMENT:  01/19/23 THERAPEUTIC EXERCISE: to improve flexibility, strength and mobility.  Demonstration, verbal and tactile cues throughout for technique.  NuStep - L5 x 6 min   THERAPEUTIC ACTIVITIES: TUG = 12.10 sec w/o AD = 11.38 sec Gait speed = 2.88 ft/sec FGA = 17/30  NEUROMUSCULAR RE-EDUCATION: To improve balance, proprioception, coordination, and reduce fall risk.  SLS + 5-way toe tap in direction of colored dots (not quite able to reach foot to dots) Fwd step-over 4 sequential low obstacles (canes and yardsticks) x 10 B lateral step-over 4 sequential low obstacles (canes and yardsticks) x 5   01/13/23 THERAPEUTIC EXERCISE: to improve flexibility, strength and mobility.  Demonstration, verbal and tactile cues throughout for technique.  NuStep - L5 x 6 min  Seated unilateral RTB hip ABD/ER clam 10 x 3", B RTB clam 10 x 3" - pt prefers the latter Seated RTB hip flexion march 10 x 3"  Seated scapular retraction + depression 10 x 5" Standing anatomical position with scapular retraction and depression at wall 10 x 5" Sit to stand with single UE support x 5 Sit to stand + RTB hip ABD isometric with B UE support x 5 Verbal review of: Standing alt hip abduction with looped RTB at knees x 10, UE support on  counter for balance Standing alt hip extension with looped RTB at knees x 10, UE support on counter for balance Side steps  with RTB at knees   NEUROMUSCULAR RE-EDUCATION: To improve posture, balance, proprioception, and reduce fall risk.  Corner balance progression with 1/2 tandem stance on firm surface with arms at sides            Eyes open: - static stance x 30 sec - horiz head turns x 5 - vertical head nods x 5 Eyes closed: - static stance x 20 sec - horiz head turns x 5 - vertical head nods x 5   01/07/23 THERAPEUTIC EXERCISE: to improve flexibility, strength and mobility.  Demonstration, verbal and tactile cues throughout for technique.  NuStep - L3 x 6 min - no increased knee pain today Standing alt hip abduction with looped RTB at knees x 20, UE support on counter for balance Standing alt hip extension with looped RTB at knees x 20, UE support on counter for balance Side steps with RTB at knees 5x50ft; rest required after 3x down and back Standing WS lunge on 6' step: fwd x 10; lateral x 10 B with intermittent UE support  Clock balance 1/2 circle 3x each LE Seated ball squeeze and LAQ + ball squeeze x 10 each B   PATIENT EDUCATION:  Education details: progress with PT and continue with current HEP Person educated: Patient Education method: Explanation Education comprehension: verbalized understanding  HOME EXERCISE PROGRAM: Access Code: [email or web address] URL: [email or web address] Date: 01/13/2023 Prepared by: Glenetta Hew  Exercises - Seated Plantar Fascia Stretch (Mirrored)  - 1-2 x daily - 7 x weekly - 3 reps - 30 sec hold - Plantar Fascia Stretch on Step (Mirrored)  - 1-2 x daily - 7 x weekly - 3 reps - 30 sec hold - Foot Roller Plantar Massage  - 1-2 x daily - 7 x weekly - 2-3 min hold - Seated Piriformis Stretch  - 1-2 x daily - 7 x weekly - 3 reps - 30 sec hold - Seated Scapular Resetting  - 1 x daily - 7 x weekly - 2 sets - 10 reps - 3 sec hold -  Standing Anatomical Position with Scapular Retraction and Depression at Wall  - 1-2 x daily - 7 x weekly - 2 sets - 10 reps - 3 sec hold - Standing Lumbar Extension at Wall - Forearms  - 1-2 x daily - 7 x weekly - 2 sets - 10 reps - 3-5 sec hold - Seated March with Resistance  - 1 x daily - 7 x weekly - 2 sets - 10 reps - 3 sec hold - Seated Hip Abduction with Resistance  - 1 x daily - 7 x weekly - 2 sets - 10 reps - 3 sec hold - Sit to Stand with Resistance Around Legs  - 1 x daily - 7 x weekly - 2 sets - 10 reps - Side Stepping with Resistance at Thighs and Counter Support  - 1 x daily - 7 x weekly - 2 sets - 10 reps - 3 sec hold - Corner Balance Feet Together With Eyes Open  - 1 x daily - 7 x weekly - 3 reps - 30 sec hold - Standing Hip Extension with Resistance at Ankles and Unilateral Counter Support  - 1 x daily - 7 x weekly - 2 sets - 10 reps - 3 sec hold - Corner Balance Feet Together: Eyes Open With Head Turns  - 1 x daily - 7 x weekly - 2 sets - 5 reps - Corner Balance Feet Together: Eyes Closed With Head Turns  -  1 x daily - 7 x weekly - 2 sets - 10 reps - 3 sec hold   ASSESSMENT:  CLINICAL IMPRESSION: Cera reports better comfort with HEP following review and consolidation last visit, and denies need for further review at this time.  She is demonstrating improving balance as evidenced by gains on TUG time (decreased to 12.10 sec - STG#3 now met) and FGA (increased to 17/30).  When questioned about areas of ongoing concern or awareness of deficits with tasks at home, she was not able to recall any specific concerns at the moment but feels like there were things she wanted to address.  In light of this, remainder of session focusing on balance related deficits targeting multidirectional stepping and foot clearance with stepping over obstacles.  Will continue to assess areas of deficits related to goals over the next few visits in preparation for the 10th visit progress note, as well as  address any concerns as patient is able to recall these.  Korryn is progressing well toward her PT goals and will benefit from continued skilled PT to address ongoing deficits to improve mobility and activity tolerance with decreased pain interference and decreased risk for falls.   OBJECTIVE IMPAIRMENTS: Abnormal gait, decreased activity tolerance, decreased balance, decreased coordination, decreased knowledge of condition, decreased knowledge of use of DME, decreased mobility, difficulty walking, decreased strength, decreased safety awareness, improper body mechanics, postural dysfunction, and pain.   ACTIVITY LIMITATIONS: carrying, lifting, standing, squatting, stairs, transfers, bed mobility, bathing, toileting, dressing, reach over head, and locomotion level  PARTICIPATION LIMITATIONS: meal prep, cleaning, laundry, shopping, and community activity  PERSONAL FACTORS: Age, Past/current experiences, Time since onset of injury/illness/exacerbation, and 3+ comorbidities: Anxiety, CAD, HTN, LBP, B knee pain, GERD  are also affecting patient's functional outcome.   REHAB POTENTIAL: Good  CLINICAL DECISION MAKING: Evolving/moderate complexity  EVALUATION COMPLEXITY: Moderate   GOALS: Goals reviewed with patient? Yes  SHORT TERM GOALS: Target date: 01/05/2023  Patient will be independent with initial HEP. Baseline:  Goal status: MET - 01/13/23   2.  Patient will verbalize understanding of strategies to decrease risk of falls. Baseline: Education on fall prevention techniques provided during last PT episode Goal status: MET - 01/03/23  3.  Patient will demonstrate decreased TUG time to </= 13.5 sec to decrease risk for falls with transitional mobility. Baseline: 16.09 sec without AD; 01/03/23 - 16.01 sec w/o AD Goal status: MET - 01/19/23 - 12.10 sec w/o AD  LONG TERM GOALS: Target date: 02/09/2023  Patient will be independent with advanced/ongoing HEP to improve outcomes and carryover.   Baseline:  Goal status: IN PROGRESS - 01/19/23 - Met for current HEP  2.  Patient will report at least 50-75% improvement in L foot pain to improve QOL. Baseline: 0/10 following injection 12/14/22 Goal status: MET - 01/19/23  3.  Patient will demonstrate improved overall B UE strength to >/= 4+/5 for functional UE use. Baseline: Refer to above UE MMT table (12/24/22) Goal status: IN PROGRESS  4.  Patient will demonstrate improved B LE strength to >/= 4+/5 for improved stability and ease of mobility. Baseline: Refer to above LE MMT table Goal status: IN PROGRESS  5.  Patient will be able to ambulate 600' with or w/o LRAD and normal gait pattern with good safety to access community.  Baseline:  Goal status: IN PROGRESS  6. Patient will be able to ascend/descend curb safely with or w/o LRAD for safety with community ambulation.  Baseline:  Goal status:  IN PROGRESS  7.  Patient will report >/= 70% on ABC scale to demonstrate improved balance confidence with functional mobility.  Baseline: 930 / 1600 = 58.1 % Goal status: IN PROGRESS  8.  Patient will improve Berg score by at least 8 points (46/56) to improve safety and stability with ADLs in standing and reduce risk for falls.  Baseline: 38/56 Goal status: IN PROGRESS  9.  Patient will improve FGA score to >/= 19/30 to improve gait stability and reduce risk for falls. Baseline: 14/30 Goal status: IN PROGRESS - 01/19/23 - 17/30  10.  Patient will report >/= 66% on LEFS to demonstrate improved functional ability. Baseline: 46 / 80 = 57.5 % Goal status: IN PROGRESS    PLAN:  PT FREQUENCY: 2x/week  PT DURATION: 6-8 weeks  PLANNED INTERVENTIONS: Therapeutic exercises, Therapeutic activity, Neuromuscular re-education, Balance training, Gait training, Patient/Family education, Self Care, Joint mobilization, Joint manipulation, Stair training, Dry Needling, Electrical stimulation, Cryotherapy, Moist heat, Taping, Vasopneumatic device,  Ultrasound, Manual therapy, and Re-evaluation  PLAN FOR NEXT SESSION: continue to reassess balance Sharlene Motts), UE ROM and UE/LE MMT; progress LE and postural strengthening; balance training   Marry Guan, PT 01/19/2023, 12:02 PM

## 2023-01-25 ENCOUNTER — Ambulatory Visit: Payer: Medicare HMO

## 2023-01-25 DIAGNOSIS — R2689 Other abnormalities of gait and mobility: Secondary | ICD-10-CM

## 2023-01-25 DIAGNOSIS — R2681 Unsteadiness on feet: Secondary | ICD-10-CM | POA: Diagnosis not present

## 2023-01-25 DIAGNOSIS — M6281 Muscle weakness (generalized): Secondary | ICD-10-CM

## 2023-01-25 NOTE — Therapy (Signed)
OUTPATIENT PHYSICAL THERAPY TREATMENT   Patient Name: [name] MRN: [id number] DOB:1926/09/08, 89 y.o., female Today's Date: 01/25/2023  END OF SESSION:  PT End of Session - 01/25/23 1625     Visit Number 9    Date for PT Re-Evaluation 02/09/23    Authorization Type [employer]    PT Start Time 1617    PT Stop Time 1658    PT Time Calculation (min) 41 min    Activity Tolerance Patient tolerated treatment well    Behavior During Therapy WFL for tasks assessed/performed                  Past Medical History:  Diagnosis Date   Aortic valve sclerosis    CAD (coronary artery disease)    S/p DES to the proximal RCA, POBA to the distal RCA 8/08 //Myoview 2009 low risk // Myoview 9/22: EF 57, no ischemia or infarction, apical thinning artifact, low risk study   DUB (dysfunctional uterine bleeding)    Elevated cholesterol    Atorvastatin   GERD (gastroesophageal reflux disease)    History of echocardiogram    EF 60-65, GRII DD, GLS -18.4, normal RVSF, moderate MAC, moderate AV calcification, trivial AI   Hypertension    Plantar fasciitis    Shingles    Past Surgical History:  Procedure Laterality Date   APPENDECTOMY  2011   CARDIAC STINT IMPLANT  2008   CARPAL TUNNEL RELEASE     EYE SURGERY     INGUINAL HERNIA REPAIR     OOPHORECTOMY  2001   LAVH,BSO   ULNA NERVE RELEASE  2009   VAGINAL HYSTERECTOMY  2001   LAVH, BSO   Patient Active Problem List   Diagnosis Date Noted   High risk medication use 03/25/2022   Bilateral sensorineural hearing loss 12/29/2021   Anxiety 11/24/2021   Aortic valve sclerosis 02/24/2021   Varicose veins of both lower extremities 02/24/2021   Left knee pain 04/28/2015   Coronary artery disease involving native coronary artery of native heart without angina pectoris 11/22/2013   Essential hypertension 11/22/2013   Pure hypercholesterolemia 11/22/2013   Inversion, nipple 09/05/2013   Shingles    Elevated cholesterol     Cancer (HCC)    Right knee pain 05/10/2011    PCP: Clayborne Dana, NP   REFERRING PROVIDER: Clayborne Dana, NP (balance & weakness); McCaughan, Dia D, DPM (plantar fascial fibromatosis)  REFERRING DIAG:  R26.89 (ICD-10-CM) - Balance problems  R53.1 (ICD-10-CM) - Weakness  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  EVAL:  Pt was receiving PT earlier this year for balance problems but had to stop due to developing shingles. After the shingles resolved she developed L plantar fasciitis which further limited her mobility exacerbating her overall weakness and balance issues. She received an injection for the plantar fasciitis yesterday which has helped a lot - no pain today. New PT referral received to "Eval & treat" for the plantar fasciitis in addition to initial referral for balance and weakness. She notes difficulty with tasks where she has to lift overhead (dishes into the cabinets) and feels unsteady. She has a cane which she brought to PT today but states  she has not been using it normally. She tries to walk 10 minute daily in her house but has not kept up with the HEP from her last PT episode.  PAIN: Are you having pain? No  PERTINENT HISTORY: Anxiety, CAD, HTN, LBP, B knee pain, GERD, shingles (June 2024), plantar fasciitis (July/Aug 2024)  PRECAUTIONS: Fall  HAND DOMINANCE: Right  RED FLAGS: None  WEIGHT BEARING RESTRICTIONS: No  FALLS:  Has patient fallen in last 6 months? 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 increased calcaneal inclination angle on lateral view.  Inferior calcaneal spur present.  No fracture noted  PATIENT SURVEYS:  ABC scale 930 / 1600 = 58.1 % LEFS 46 / 80 = 57.5 %  ABC scale as of 07/20/2022 PT eval: 61.3%  COGNITION: Overall cognitive status: Within functional limits for tasks assessed     SENSATION: WFL  EDEMA:  Chronic B ankle edema  POSTURE:  rounded shoulders, forward head, and increased thoracic kyphosis  UPPER EXTREMITY ROM:   Active ROM Right 12/24/22 Left 12/24/22  Shoulder flexion 129 121  Shoulder extension 55 54  Shoulder abduction 140 114  Shoulder adduction    Shoulder internal rotation FIR [facility] FIR WFL  Shoulder external rotation FER T2 FER T1  Elbow flexion    Elbow extension    Wrist flexion    Wrist extension    Wrist ulnar deviation    Wrist radial deviation    Wrist pronation    Wrist supination    (Blank rows = not tested)  UPPER EXTREMITY MMT:   MMT Right 12/24/22 Left 12/24/22  Shoulder flexion 4+ 4+  Shoulder extension 4+ 4+  Shoulder abduction 4+ 4+  Shoulder adduction    Shoulder internal rotation 4+ 4+  Shoulder external rotation 4 4-  Middle trapezius    Lower trapezius    Neck (Blank rows = not tested)  MUSCLE LENGTH: Hamstrings: Mild tight L>R ITB: NT Piriformis: Mild tight L>R Hip flexors: Mild tight B  LOWER EXTREMITY ROM: Proximal B LE AROM WFL Active ROM Right eval Left eval  Ankle dorsiflexion 20 20  Ankle  plantarflexion 42 40  Ankle inversion 34 28  Ankle eversion 24 18   (Blank rows = not tested)  LOWER EXTREMITY MMT:  MMT Right eval Left eval  Hip flexion 4- 4-  Hip extension 3+ 3+  Hip abduction 4 4  Hip adduction 4- 4-  Hip internal rotation 4+ 4  Hip external rotation 4- 4-  Knee flexion 4+ 4+  Knee extension 4+ 4+  Ankle dorsiflexion 4+ 4+  Ankle plantarflexion 5 5  Ankle inversion 4- 4-  Ankle eversion 4- 4-   (Blank rows = not tested)  FUNCTIONAL TESTS:  5 times sit to stand: 21.44 sec with need for B UE assist - limited by B knee pain  Timed up and go (TUG): 16.09 sec w/o AD; >13.5  sec indicates high risk for falls 10 meter walk test: 12.25 sec w/o AD, Gait speed = 2.68 ft sec w/o AD Berg Balance Scale: 38/56; 37-45 significant risk for falls (>80%) - 12/24/22  Functional gait assessment: 14/30; < 19 = high risk fall  01/19/23: TUG = 12.10 sec w/o AD = 11.38 sec Gait speed = 2.88 ft/sec FGA = 17/30  As of 07/20/22 PT eval: 5 times sit to stand: 21.4 sec  Berg Balance Scale: 42/56 Significant risk for falls Dynamic Gait Index: 18/24 (07/28/22) MCTSIB: Condition 1: Avg of 3 trials: 30 sec, Condition 2: Avg of 3 trials: 30 sec, Condition 3: Avg of 3 trials: 30 sec, Condition 4: Avg of 3 trials: 30 sec, and Total Score: 120/120  BED MOBILITY:  Sit to supine Complete Independence Supine to sit Complete Independence Rolling to Right SBA  TRANSFERS: Assistive device utilized: None  Sit to stand: Modified independence Stand to sit: Modified independence Chair to chair: Modified independence Floor:  NT  GAIT: Distance walked: Clinic distances Assistive device utilized: Single point cane and None Level of assistance: Complete Independence and Modified independence Gait pattern:  without AD, step through pattern, decreased step length- Right, decreased hip/knee flexion- Left, wide BOS, and poor foot clearance- Left, Decreased heel strike on left -  Comments:  with cane: improved gait pattern and patient confidence   RAMP: Level of Assistance:  NT Assistive device utilized:    Ramp Comments:   CURB:  Level of Assistance:  NT Assistive device utilized:    Curb Comments:   STAIRS:  Level of Assistance: SBA  Stair Negotiation Technique: Step to Pattern with Single Rail on Right - B hands on rail on ascent  Number of Stairs: 7   Height of Stairs: 7"  Comments: pulls up with B UE leading with L leg  TODAY'S TREATMENT: 01/25/23 THERAPEUTIC EXERCISE: to improve flexibility, strength and mobility.  Demonstration, verbal and tactile cues throughout for technique.  NuStep - L5 x 7 min  BERG: 42/56 - still in significant fall risk category SLS + 5-way toe tap in direction of colored dots (not quite able to reach foot to dots) Fwd step and reach 2 x 10 each side Seated marching 2 x 10 BLE 2#  Seated LAQ x 5 BLE 2#  01/19/23 THERAPEUTIC EXERCISE: to improve flexibility, strength and mobility.  Demonstration, verbal and tactile cues throughout for technique.  NuStep - L5 x 6 min   THERAPEUTIC ACTIVITIES: TUG = 12.10 sec w/o AD = 11.38 sec Gait speed = 2.88 ft/sec FGA = 17/30  NEUROMUSCULAR RE-EDUCATION: To improve balance, proprioception, coordination, and reduce fall risk.  SLS + 5-way toe tap in direction of colored dots (not quite able to reach foot to dots) Fwd step-over 4 sequential low obstacles (canes and yardsticks) x 10 B lateral step-over 4 sequential low obstacles (canes and yardsticks) x 5   01/13/23 THERAPEUTIC EXERCISE: to improve flexibility, strength and mobility.  Demonstration, verbal and tactile cues throughout for technique.  NuStep - L5 x 6 min  Seated unilateral RTB hip ABD/ER clam 10 x 3", B RTB clam 10 x 3" - pt prefers the latter Seated RTB hip flexion march 10 x 3"  Seated scapular retraction + depression 10 x 5" Standing anatomical position with scapular retraction and depression at wall 10 x 5" Sit to  stand with single UE support x 5 Sit to stand + RTB hip ABD isometric with B UE support x 5 Verbal  review of: Standing alt hip abduction with looped RTB at knees x 10, UE support on counter for balance Standing alt hip extension with looped RTB at knees x 10, UE support on counter for balance Side steps with RTB at knees   NEUROMUSCULAR RE-EDUCATION: To improve posture, balance, proprioception, and reduce fall risk.  Corner balance progression with 1/2 tandem stance on firm surface with arms at sides            Eyes open: - static stance x 30 sec - horiz head turns x 5 - vertical head nods x 5 Eyes closed: - static stance x 20 sec - horiz head turns x 5 - vertical head nods x 5   01/07/23 THERAPEUTIC EXERCISE: to improve flexibility, strength and mobility.  Demonstration, verbal and tactile cues throughout for technique.  NuStep - L3 x 6 min - no increased knee pain today Standing alt hip abduction with looped RTB at knees x 20, UE support on counter for balance Standing alt hip extension with looped RTB at knees x 20, UE support on counter for balance Side steps with RTB at knees 5x31ft; rest required after 3x down and back Standing WS lunge on 6' step: fwd x 10; lateral x 10 B with intermittent UE support  Clock balance 1/2 circle 3x each LE Seated ball squeeze and LAQ + ball squeeze x 10 each B   PATIENT EDUCATION:  Education details: progress with PT and continue with current HEP Person educated: Patient Education method: Explanation Education comprehension: verbalized understanding  HOME EXERCISE PROGRAM: Access Code: [email or web address] URL: [email or web address] Date: 01/13/2023 Prepared by: Glenetta Hew  Exercises - Seated Plantar Fascia Stretch (Mirrored)  - 1-2 x daily - 7 x weekly - 3 reps - 30 sec hold - Plantar Fascia Stretch on Step (Mirrored)  - 1-2 x daily - 7 x weekly - 3 reps - 30 sec hold - Foot Roller Plantar Massage  - 1-2 x daily - 7 x weekly -  2-3 min hold - Seated Piriformis Stretch  - 1-2 x daily - 7 x weekly - 3 reps - 30 sec hold - Seated Scapular Resetting  - 1 x daily - 7 x weekly - 2 sets - 10 reps - 3 sec hold - Standing Anatomical Position with Scapular Retraction and Depression at Wall  - 1-2 x daily - 7 x weekly - 2 sets - 10 reps - 3 sec hold - Standing Lumbar Extension at Wall - Forearms  - 1-2 x daily - 7 x weekly - 2 sets - 10 reps - 3-5 sec hold - Seated March with Resistance  - 1 x daily - 7 x weekly - 2 sets - 10 reps - 3 sec hold - Seated Hip Abduction with Resistance  - 1 x daily - 7 x weekly - 2 sets - 10 reps - 3 sec hold - Sit to Stand with Resistance Around Legs  - 1 x daily - 7 x weekly - 2 sets - 10 reps - Side Stepping with Resistance at Thighs and Counter Support  - 1 x daily - 7 x weekly - 2 sets - 10 reps - 3 sec hold - Corner Balance Feet Together With Eyes Open  - 1 x daily - 7 x weekly - 3 reps - 30 sec hold - Standing Hip Extension with Resistance at Ankles and Unilateral Counter Support  - 1 x daily - 7 x weekly - 2 sets - 10 reps - 3 sec  hold - Oncologist Feet Together: Eyes Open With Head Turns  - 1 x daily - 7 x weekly - 2 sets - 5 reps - Corner Balance Feet Together: Eyes Closed With Head Turns  - 1 x daily - 7 x weekly - 2 sets - 10 reps - 3 sec hold   ASSESSMENT:  CLINICAL IMPRESSION: BERG balance score has improved by 4 points today. Continued with focus on balance and strength for LE. Pt required cues with the fwd stepping and back for proper weight shift and also had mild unsteadiness with this exercise. She continues to have decreased strength of LE's. Ramonia is progressing well toward her PT goals and will benefit from continued skilled PT to address ongoing deficits to improve mobility and activity tolerance with decreased pain interference and decreased risk for falls.   OBJECTIVE IMPAIRMENTS: Abnormal gait, decreased activity tolerance, decreased balance, decreased coordination,  decreased knowledge of condition, decreased knowledge of use of DME, decreased mobility, difficulty walking, decreased strength, decreased safety awareness, improper body mechanics, postural dysfunction, and pain.   ACTIVITY LIMITATIONS: carrying, lifting, standing, squatting, stairs, transfers, bed mobility, bathing, toileting, dressing, reach over head, and locomotion level  PARTICIPATION LIMITATIONS: meal prep, cleaning, laundry, shopping, and community activity  PERSONAL FACTORS: Age, Past/current experiences, Time since onset of injury/illness/exacerbation, and 3+ comorbidities: Anxiety, CAD, HTN, LBP, B knee pain, GERD  are also affecting patient's functional outcome.   REHAB POTENTIAL: Good  CLINICAL DECISION MAKING: Evolving/moderate complexity  EVALUATION COMPLEXITY: Moderate   GOALS: Goals reviewed with patient? Yes  SHORT TERM GOALS: Target date: 01/05/2023  Patient will be independent with initial HEP. Baseline:  Goal status: MET - 01/13/23   2.  Patient will verbalize understanding of strategies to decrease risk of falls. Baseline: Education on fall prevention techniques provided during last PT episode Goal status: MET - 01/03/23  3.  Patient will demonstrate decreased TUG time to </= 13.5 sec to decrease risk for falls with transitional mobility. Baseline: 16.09 sec without AD; 01/03/23 - 16.01 sec w/o AD Goal status: MET - 01/19/23 - 12.10 sec w/o AD  LONG TERM GOALS: Target date: 02/09/2023  Patient will be independent with advanced/ongoing HEP to improve outcomes and carryover.  Baseline:  Goal status: IN PROGRESS - 01/19/23 - Met for current HEP  2.  Patient will report at least 50-75% improvement in L foot pain to improve QOL. Baseline: 0/10 following injection 12/14/22 Goal status: MET - 01/19/23  3.  Patient will demonstrate improved overall B UE strength to >/= 4+/5 for functional UE use. Baseline: Refer to above UE MMT table (12/24/22) Goal status: IN  PROGRESS  4.  Patient will demonstrate improved B LE strength to >/= 4+/5 for improved stability and ease of mobility. Baseline: Refer to above LE MMT table Goal status: IN PROGRESS  5.  Patient will be able to ambulate 600' with or w/o LRAD and normal gait pattern with good safety to access community.  Baseline:  Goal status: IN PROGRESS  6. Patient will be able to ascend/descend curb safely with or w/o LRAD for safety with community ambulation.  Baseline:  Goal status: IN PROGRESS  7.  Patient will report >/= 70% on ABC scale to demonstrate improved balance confidence with functional mobility.  Baseline: 930 / 1600 = 58.1 % Goal status: IN PROGRESS  8.  Patient will improve Berg score by at least 8 points (46/56) to improve safety and stability with ADLs in standing and reduce risk for falls.  Baseline: 38/56 Goal status: IN PROGRESS- 01/25/23 see treatment for score  9.  Patient will improve FGA score to >/= 19/30 to improve gait stability and reduce risk for falls. Baseline: 14/30 Goal status: IN PROGRESS - 01/19/23 - 17/30  10.  Patient will report >/= 66% on LEFS to demonstrate improved functional ability. Baseline: 46 / 80 = 57.5 % Goal status: IN PROGRESS    PLAN:  PT FREQUENCY: 2x/week  PT DURATION: 6-8 weeks  PLANNED INTERVENTIONS: Therapeutic exercises, Therapeutic activity, Neuromuscular re-education, Balance training, Gait training, Patient/Family education, Self Care, Joint mobilization, Joint manipulation, Stair training, Dry Needling, Electrical stimulation, Cryotherapy, Moist heat, Taping, Vasopneumatic device, Ultrasound, Manual therapy, and Re-evaluation  PLAN FOR NEXT SESSION: 10th visit PN; continue to reassess balance Sharlene Motts), UE ROM and UE/LE MMT; progress LE and postural strengthening; balance training   Darleene Cleaver, PTA 01/25/2023, 5:30 PM

## 2023-01-27 ENCOUNTER — Ambulatory Visit: Payer: Medicare HMO | Admitting: Physical Therapy

## 2023-01-27 ENCOUNTER — Encounter: Payer: Self-pay | Admitting: Physical Therapy

## 2023-01-27 DIAGNOSIS — R2681 Unsteadiness on feet: Secondary | ICD-10-CM | POA: Diagnosis not present

## 2023-01-27 DIAGNOSIS — M6281 Muscle weakness (generalized): Secondary | ICD-10-CM

## 2023-01-27 DIAGNOSIS — R2689 Other abnormalities of gait and mobility: Secondary | ICD-10-CM

## 2023-01-27 DIAGNOSIS — R262 Difficulty in walking, not elsewhere classified: Secondary | ICD-10-CM

## 2023-01-27 DIAGNOSIS — M79672 Pain in left foot: Secondary | ICD-10-CM

## 2023-01-27 NOTE — Therapy (Signed)
OUTPATIENT PHYSICAL THERAPY TREATMENT  Progress Note  Reporting Period 12/15/2022 to 01/27/2023   See note below for Objective Data and Assessment of Progress/Goals.     Patient Name: [name] MRN: [id number] DOB:23-Jan-1927, 89 y.o., female Today's Date: 01/27/2023  END OF SESSION:  PT End of Session - 01/27/23 1102     Visit Number 10    Date for PT Re-Evaluation 02/09/23    Authorization Type [employer]    Progress Note Due on Visit 20    PT Start Time 1102    PT Stop Time 1150    PT Time Calculation (min) 48 min    Activity Tolerance Patient tolerated treatment well    Behavior During Therapy WFL for tasks assessed/performed                  Past Medical History:  Diagnosis Date   Aortic valve sclerosis    CAD (coronary artery disease)    S/p DES to the proximal RCA, POBA to the distal RCA 8/08 //Myoview 2009 low risk // Myoview 9/22: EF 57, no ischemia or infarction, apical thinning artifact, low risk study   DUB (dysfunctional uterine bleeding)    Elevated cholesterol    Atorvastatin   GERD (gastroesophageal reflux disease)    History of echocardiogram    EF 60-65, GRII DD, GLS -18.4, normal RVSF, moderate MAC, moderate AV calcification, trivial AI   Hypertension    Plantar fasciitis    Shingles    Past Surgical History:  Procedure Laterality Date   APPENDECTOMY  2011   CARDIAC STINT IMPLANT  2008   CARPAL TUNNEL RELEASE     EYE SURGERY     INGUINAL HERNIA REPAIR     OOPHORECTOMY  2001   LAVH,BSO   ULNA NERVE RELEASE  2009   VAGINAL HYSTERECTOMY  2001   LAVH, BSO   Patient Active Problem List   Diagnosis Date Noted   High risk medication use 03/25/2022   Bilateral sensorineural hearing loss 12/29/2021   Anxiety 11/24/2021   Aortic valve sclerosis 02/24/2021   Varicose veins of both lower extremities 02/24/2021   Left knee pain 04/28/2015   Coronary artery disease involving native coronary artery of native heart without angina  pectoris 11/22/2013   Essential hypertension 11/22/2013   Pure hypercholesterolemia 11/22/2013   Inversion, nipple 09/05/2013   Shingles    Elevated cholesterol    Cancer (HCC)    Right knee pain 05/10/2011    PCP: Clayborne Dana, NP   REFERRING PROVIDER: Clayborne Dana, NP (balance & weakness); McCaughan, Dia D, DPM (plantar fascial fibromatosis)  REFERRING DIAG:  R26.89 (ICD-10-CM) - Balance problems  R53.1 (ICD-10-CM) - Weakness  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  EVAL:  Pt was receiving PT earlier this year for balance problems but had to stop due to developing shingles. After the shingles resolved she developed L plantar fasciitis which further limited her mobility exacerbating her overall weakness and balance issues. She received an injection for the plantar fasciitis yesterday which has  helped a lot - no pain today. New PT referral received to "Eval & treat" for the plantar fasciitis in addition to initial referral for balance and weakness. She notes difficulty with tasks where she has to lift overhead (dishes into the cabinets) and feels unsteady. She has a cane which she brought to PT today but states she has not been using it normally. She tries to walk 10 minute daily in her house but has not kept up with the HEP from her last PT episode.  PAIN: Are you having pain? Yes: NPRS scale: 2/10 Pain location: L knee Pain description: dull Aggravating factors: unpredictable Relieving factors: "let it run its  course"  PERTINENT HISTORY: Anxiety, CAD, HTN, LBP, B knee pain, GERD, shingles (June 2024), plantar fasciitis (July/Aug 2024)  PRECAUTIONS: Fall  HAND DOMINANCE: Right  RED FLAGS: None  WEIGHT BEARING RESTRICTIONS: No  FALLS:  Has patient fallen in last 6 months? 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 increased calcaneal inclination angle on lateral view.  Inferior calcaneal spur present.  No fracture noted  PATIENT SURVEYS:  ABC scale 930 / 1600 = 58.1 % LEFS 46 / 80 = 57.5 %  01/27/23: ABC scale: 1050 / 1600 = 65.6 % LEFS: 50 / 80 = 62.5 %  ABC scale as of 07/20/2022 PT eval: 61.3%  COGNITION: Overall cognitive status: Within functional limits for tasks assessed     SENSATION: WFL  EDEMA:  Chronic B ankle edema  POSTURE:  rounded shoulders, forward head, and increased thoracic kyphosis  UPPER EXTREMITY ROM:   Active ROM Right 12/24/22 Left 12/24/22 R 01/27/23 L 01/27/23  Shoulder flexion 129 121 128 122  Shoulder extension 55 54    Shoulder abduction 140 114 129 114  Shoulder adduction      Shoulder internal rotation FIR [facility] FIR WFL    Shoulder external rotation FER T2 FER T1    Elbow flexion      Elbow extension      Wrist flexion      Wrist extension      Wrist ulnar deviation      Wrist radial deviation       Wrist pronation      Wrist supination      (Blank rows = not tested)  UPPER EXTREMITY MMT:   MMT Right 12/24/22 Left 12/24/22 R 01/27/23 L 01/27/23  Shoulder flexion 4+ 4+ 5 5  Shoulder extension 4+ 4+ 5 5  Shoulder abduction 4+ 4+ 5 5  Shoulder adduction      Shoulder internal rotation 4+ 4+ 5 5  Shoulder external rotation 4 4- 4+ 4+  Middle trapezius      Lower trapezius      Neck (Blank rows = not tested)  MUSCLE LENGTH: Hamstrings: Mild tight L>R ITB: NT Piriformis: Mild tight L>R Hip flexors: Mild tight B  LOWER EXTREMITY ROM: Proximal B LE AROM  [facility] Active ROM Right eval Left eval  Ankle dorsiflexion 20 20  Ankle plantarflexion 42 40  Ankle inversion 34 28  Ankle eversion 24 18   (Blank rows = not tested)  LOWER EXTREMITY MMT:  MMT Right eval Left eval R 01/27/23 L 01/27/23  Hip flexion 4- 4- 4 4  Hip extension 3+ 3+ 3+ 3+  Hip abduction 4 4 4 4   Hip adduction 4- 4- 4 4  Hip internal rotation 4+ 4 4+ 4+  Hip external rotation 4- 4- 4 4  Knee flexion 4+ 4+ 5 5  Knee extension 4+ 4+ 5 5  Ankle dorsiflexion 4+ 4+ 5 5  Ankle plantarflexion 5 5 5 5   Ankle inversion 4- 4- 4+ 4+  Ankle eversion 4- 4- 4+ 4   (Blank rows = not tested)  FUNCTIONAL TESTS:  5 times sit to stand: 21.44 sec with need for B UE assist - limited by B knee pain  Timed up and go (TUG): 16.09 sec w/o AD; >13.5 sec indicates high risk for falls 10 meter walk test: 12.25 sec w/o AD, Gait speed = 2.68 ft sec w/o AD Berg Balance Scale: 38/56; 37-45 significant risk for falls (>80%) - 12/24/22  Functional gait assessment: 14/30; < 19 = high risk fall  01/19/23: TUG = 12.10 sec w/o AD = 11.38 sec Gait speed = 2.88 ft/sec FGA = 17/30; < 19 = high risk fall  01/25/23: Berg = 42/56; 37-45 significant risk for falls (>80%)  As of 07/20/22 PT eval: 5 times sit to stand: 21.4 sec  Berg Balance Scale: 42/56 Significant risk for falls Dynamic Gait Index: 18/24 (07/28/22) MCTSIB: Condition  1: Avg of 3 trials: 30 sec, Condition 2: Avg of 3 trials: 30 sec, Condition 3: Avg of 3 trials: 30 sec, Condition 4: Avg of 3 trials: 30 sec, and Total Score: 120/120  BED MOBILITY:  Sit to supine Complete Independence Supine to sit Complete Independence Rolling to Right SBA  TRANSFERS: Assistive device utilized: None  Sit to stand: Modified independence Stand to sit: Modified independence Chair to chair: Modified independence Floor:  NT  GAIT: Distance walked: Clinic distances Assistive device utilized: Single point cane and None Level of assistance: Complete Independence and Modified independence Gait pattern:  without AD, step through pattern, decreased step length- Right, decreased hip/knee flexion- Left, wide BOS, and poor foot clearance- Left, Decreased heel strike on left -  Comments: with cane: improved gait pattern and patient confidence   RAMP: Level of Assistance:  NT Assistive device utilized:    Ramp Comments:   CURB:  Level of Assistance:  NT Assistive device utilized:    Curb Comments:   STAIRS:  Level of Assistance: SBA  Stair Negotiation Technique: Step to Pattern with Single Rail on Right - B hands on rail on ascent  Number of Stairs: 7   Height of Stairs: 7"  Comments: pulls up with B UE leading with L leg  TODAY'S TREATMENT:  01/27/23 THERAPEUTIC EXERCISE: to improve flexibility, strength and mobility.  Demonstration, verbal and tactile cues throughout for technique.  NuStep - L5 x 6 min   THERAPEUTIC ACTIVITIES: ABC scale: 1050 / 1600 = 65.6 % LEFS: 50 / 80 = 62.5 % UE/LE MMT Goal assessment   01/25/23 THERAPEUTIC EXERCISE: to improve flexibility, strength and mobility.  Demonstration, verbal and tactile cues throughout for technique.  NuStep - L5 x 7 min  BERG: 42/56 - still in significant fall risk category SLS + 5-way  toe tap in direction of colored dots (not quite able to reach foot to dots) Fwd step and reach 2 x 10 each side Seated  marching 2 x 10 BLE 2#  Seated LAQ x 5 BLE 2#   01/19/23 THERAPEUTIC EXERCISE: to improve flexibility, strength and mobility.  Demonstration, verbal and tactile cues throughout for technique.  NuStep - L5 x 6 min   THERAPEUTIC ACTIVITIES: TUG = 12.10 sec w/o AD = 11.38 sec Gait speed = 2.88 ft/sec FGA = 17/30  NEUROMUSCULAR RE-EDUCATION: To improve balance, proprioception, coordination, and reduce fall risk.  SLS + 5-way toe tap in direction of colored dots (not quite able to reach foot to dots) Fwd step-over 4 sequential low obstacles (canes and yardsticks) x 10 B lateral step-over 4 sequential low obstacles (canes and yardsticks) x 5   PATIENT EDUCATION:  Education details: progress with PT, ongoing PT POC, and continue with current HEP Person educated: Patient Education method: Explanation Education comprehension: verbalized understanding  HOME EXERCISE PROGRAM: Access Code: [email or web address] URL: [email or web address] Date: 01/27/2023 Prepared by: Glenetta Hew  Exercises - Seated Plantar Fascia Stretch (Mirrored)  - 1-2 x daily - 7 x weekly - 3 reps - 30 sec hold - Plantar Fascia Stretch on Step (Mirrored)  - 1-2 x daily - 7 x weekly - 3 reps - 30 sec hold - Foot Roller Plantar Massage  - 1-2 x daily - 7 x weekly - 2-3 min hold - Seated Piriformis Stretch  - 1-2 x daily - 7 x weekly - 3 reps - 30 sec hold - Seated Scapular Resetting  - 1 x daily - 7 x weekly - 2 sets - 10 reps - 3 sec hold - Standing Anatomical Position with Scapular Retraction and Depression at Wall  - 1-2 x daily - 7 x weekly - 2 sets - 10 reps - 3 sec hold - Standing Lumbar Extension at Wall - Forearms  - 1-2 x daily - 7 x weekly - 2 sets - 10 reps - 3-5 sec hold - Seated March with Resistance  - 1 x daily - 7 x weekly - 2 sets - 10 reps - 3 sec hold - Seated Hip Abduction with Resistance  - 1 x daily - 7 x weekly - 2 sets - 10 reps - 3 sec hold - Sit to Stand with Resistance Around Legs  -  1 x daily - 7 x weekly - 2 sets - 10 reps - Side Stepping with Resistance at Thighs and Counter Support  - 1 x daily - 7 x weekly - 2 sets - 10 reps - 3 sec hold - Standing Hip Extension with Resistance at Ankles and Unilateral Counter Support  - 1 x daily - 7 x weekly - 2 sets - 10 reps - 3 sec hold - Corner Balance Feet Together With Eyes Open  - 1 x daily - 7 x weekly - 3 reps - 30 sec hold - Corner Balance Feet Together: Eyes Open With Head Turns  - 1 x daily - 7 x weekly - 2 sets - 5 reps - Corner Balance Feet Together: Eyes Closed With Head Turns  - 1 x daily - 7 x weekly - 2 sets - 10 reps - 3 sec hold   ASSESSMENT:  CLINICAL IMPRESSION: Attalia reports no foot/plantar fascia pain since the injection received near onset of PT episode (LTG #2 met), however she continues to experience intermittent L knee pain with some activities.  She demonstrates  improved perceived functional ability per LEFS by 5% and improved balance confidence per ABC scale by 7.5%. Her overall strength is improving with UE strength goal (LTG #3) now met and LE strength goal (LTG #4) partially met.  Recent standardized balance testing revealing gains on TUG (decreased to 12.10 sec - STG #3 met),  Berg (38/56 to 42/56) and FGA (14/30 to 17/30), however the latter 2 tests still indicating an elevated risk for falls.  Ioana is progressing well toward her PT goals and will benefit from continued skilled PT to address ongoing deficits to improve mobility and activity tolerance with decreased pain interference and decreased risk for falls.   OBJECTIVE IMPAIRMENTS: Abnormal gait, decreased activity tolerance, decreased balance, decreased coordination, decreased knowledge of condition, decreased knowledge of use of DME, decreased mobility, difficulty walking, decreased strength, decreased safety awareness, improper body mechanics, postural dysfunction, and pain.   ACTIVITY LIMITATIONS: carrying, lifting, standing, squatting, stairs,  transfers, bed mobility, bathing, toileting, dressing, reach over head, and locomotion level  PARTICIPATION LIMITATIONS: meal prep, cleaning, laundry, shopping, and community activity  PERSONAL FACTORS: Age, Past/current experiences, Time since onset of injury/illness/exacerbation, and 3+ comorbidities: Anxiety, CAD, HTN, LBP, B knee pain, GERD  are also affecting patient's functional outcome.   REHAB POTENTIAL: Good  CLINICAL DECISION MAKING: Evolving/moderate complexity  EVALUATION COMPLEXITY: Moderate   GOALS: Goals reviewed with patient? Yes  SHORT TERM GOALS: Target date: 01/05/2023  Patient will be independent with initial HEP. Baseline:  Goal status: MET - 01/13/23   2.  Patient will verbalize understanding of strategies to decrease risk of falls. Baseline: Education on fall prevention techniques provided during last PT episode Goal status: MET - 01/03/23  3.  Patient will demonstrate decreased TUG time to </= 13.5 sec to decrease risk for falls with transitional mobility. Baseline: 16.09 sec without AD; 01/03/23 - 16.01 sec w/o AD Goal status: MET - 01/19/23 - 12.10 sec w/o AD  LONG TERM GOALS: Target date: 02/09/2023  Patient will be independent with advanced/ongoing HEP to improve outcomes and carryover.  Baseline:  Goal status: IN PROGRESS - 01/19/23 - Met for current HEP  2.  Patient will report at least 50-75% improvement in L foot pain to improve QOL. Baseline: 0/10 following injection 12/14/22 Goal status: MET - 01/19/23  3.  Patient will demonstrate improved overall B UE strength to >/= 4+/5 for functional UE use. Baseline: Refer to above UE MMT table (12/24/22) Goal status: MET - 01/27/23  4.  Patient will demonstrate improved B LE strength to >/= 4+/5 for improved stability and ease of mobility. Baseline: Refer to above LE MMT table Goal status: PARTIALLY MET - 01/27/23 - Met for knee and ankles except L eversion 4/5, hips remain weaker  5.  Patient will  be able to ambulate 600' with or w/o LRAD and normal gait pattern with good safety to access community.  Baseline:  Goal status: IN PROGRESS - 01/27/23 - Pt able to walk community distances (from parking lot into clinic or store)  6. Patient will be able to ascend/descend curb safely with or w/o LRAD for safety with community ambulation.  Baseline:  Goal status: IN PROGRESS - 01/27/23 - pt lacks confidence w/o UE support  7.  Patient will report >/= 70% on ABC scale to demonstrate improved balance confidence with functional mobility.  Baseline: 930 / 1600 = 58.1 % Goal status: IN PROGRESS - 01/27/23 - 1050 / 1600 = 65.6 %  8.  Patient will improve Berg score  by at least 8 points (46/56) to improve safety and stability with ADLs in standing and reduce risk for falls.  Baseline: 38/56 Goal status: IN PROGRESS - 01/25/23 - 42/56  9.  Patient will improve FGA score to >/= 19/30 to improve gait stability and reduce risk for falls. Baseline: 14/30 Goal status: IN PROGRESS - 01/19/23 - 17/30  10.  Patient will report >/= 66% on LEFS to demonstrate improved functional ability. Baseline: 46 / 80 = 57.5 % Goal status: IN PROGRESS - 01/27/23 - 50 / 80 = 62.5 %   PLAN:  PT FREQUENCY: 2x/week  PT DURATION: 6-8 weeks  PLANNED INTERVENTIONS: Therapeutic exercises, Therapeutic activity, Neuromuscular re-education, Balance training, Gait training, Patient/Family education, Self Care, Joint mobilization, Joint manipulation, Stair training, Dry Needling, Electrical stimulation, Cryotherapy, Moist heat, Taping, Vasopneumatic device, Ultrasound, Manual therapy, and Re-evaluation  PLAN FOR NEXT SESSION:  progress LE and postural strengthening - review & update HEP as indicated; balance training   Marry Guan, PT 01/27/2023, 11:55 AM

## 2023-01-31 ENCOUNTER — Encounter: Payer: Self-pay | Admitting: Physical Therapy

## 2023-01-31 ENCOUNTER — Ambulatory Visit: Payer: Medicare HMO | Admitting: Physical Therapy

## 2023-01-31 DIAGNOSIS — M6281 Muscle weakness (generalized): Secondary | ICD-10-CM

## 2023-01-31 DIAGNOSIS — R2689 Other abnormalities of gait and mobility: Secondary | ICD-10-CM

## 2023-01-31 DIAGNOSIS — R2681 Unsteadiness on feet: Secondary | ICD-10-CM | POA: Diagnosis not present

## 2023-01-31 DIAGNOSIS — M79672 Pain in left foot: Secondary | ICD-10-CM

## 2023-01-31 NOTE — Therapy (Signed)
OUTPATIENT PHYSICAL THERAPY TREATMENT     Patient Name: [name] MRN: [id number] DOB:09/21/1926, 89 y.o., female Today's Date: 01/31/2023  END OF SESSION:  PT End of Session - 01/31/23 1445     Visit Number 11    Date for PT Re-Evaluation 02/09/23    Authorization Type [employer]    Progress Note Due on Visit 20    PT Start Time 1445    PT Stop Time 1529    PT Time Calculation (min) 44 min    Activity Tolerance Patient tolerated treatment well    Behavior During Therapy WFL for tasks assessed/performed                   Past Medical History:  Diagnosis Date   Aortic valve sclerosis    CAD (coronary artery disease)    S/p DES to the proximal RCA, POBA to the distal RCA 8/08 //Myoview 2009 low risk // Myoview 9/22: EF 57, no ischemia or infarction, apical thinning artifact, low risk study   DUB (dysfunctional uterine bleeding)    Elevated cholesterol    Atorvastatin   GERD (gastroesophageal reflux disease)    History of echocardiogram    EF 60-65, GRII DD, GLS -18.4, normal RVSF, moderate MAC, moderate AV calcification, trivial AI   Hypertension    Plantar fasciitis    Shingles    Past Surgical History:  Procedure Laterality Date   APPENDECTOMY  2011   CARDIAC STINT IMPLANT  2008   CARPAL TUNNEL RELEASE     EYE SURGERY     INGUINAL HERNIA REPAIR     OOPHORECTOMY  2001   LAVH,BSO   ULNA NERVE RELEASE  2009   VAGINAL HYSTERECTOMY  2001   LAVH, BSO   Patient Active Problem List   Diagnosis Date Noted   High risk medication use 03/25/2022   Bilateral sensorineural hearing loss 12/29/2021   Anxiety 11/24/2021   Aortic valve sclerosis 02/24/2021   Varicose veins of both lower extremities 02/24/2021   Left knee pain 04/28/2015   Coronary artery disease involving native coronary artery of native heart without angina pectoris 11/22/2013   Essential hypertension 11/22/2013   Pure hypercholesterolemia 11/22/2013   Inversion, nipple 09/05/2013    Shingles    Elevated cholesterol    Cancer (HCC)    Right knee pain 05/10/2011    PCP: Clayborne Dana, NP   REFERRING PROVIDER: Clayborne Dana, NP (balance & weakness); McCaughan, Dia D, DPM (plantar fascial fibromatosis)  REFERRING DIAG:  R26.89 (ICD-10-CM) - Balance problems  R53.1 (ICD-10-CM) - Weakness  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 L knee pain remains intermittent.  EVAL:  Pt was receiving PT earlier this year for balance problems but had to stop due to developing shingles. After the shingles resolved she developed L plantar fasciitis which further limited her mobility exacerbating her overall weakness and balance issues. She received an injection for the plantar fasciitis yesterday which has helped a lot - no pain today. New PT referral received to "Eval & treat" for the plantar fasciitis in addition to initial referral for balance  and weakness. She notes difficulty with tasks where she has to lift overhead (dishes into the cabinets) and feels unsteady. She has a cane which she brought to PT today but states she has not been using it normally. She tries to walk 10 minute daily in her house but has not kept up with the HEP from her last PT episode.  PAIN: Are you having pain? Yes: NPRS scale: 0-2/10 Pain location: L knee Pain description: dull Aggravating factors: unpredictable Relieving factors: "let it run its course"  PERTINENT HISTORY: Anxiety, CAD, HTN, LBP, B knee pain, GERD, shingles (June 2024), plantar fasciitis (July/Aug 2024)  PRECAUTIONS: Fall  HAND DOMINANCE:  Right  RED FLAGS: None  WEIGHT BEARING RESTRICTIONS: No  FALLS:  Has patient fallen in last 6 months? 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 increased calcaneal inclination angle on lateral view.  Inferior calcaneal spur present.  No fracture noted  PATIENT SURVEYS:  ABC scale 930 / 1600 = 58.1 % LEFS 46 / 80 = 57.5 %  01/27/23: ABC scale: 1050 / 1600 = 65.6 % LEFS: 50 / 80 = 62.5 %  ABC scale as of 07/20/2022 PT eval: 61.3%  COGNITION: Overall cognitive status: Within functional limits for tasks assessed     SENSATION: WFL  EDEMA:  Chronic B ankle edema  POSTURE:  rounded shoulders, forward head, and increased thoracic kyphosis  UPPER EXTREMITY ROM:   Active ROM Right 12/24/22 Left 12/24/22 R 01/27/23 L 01/27/23  Shoulder flexion 129 121 128 122  Shoulder extension 55 54    Shoulder abduction 140 114 129 114  Shoulder adduction      Shoulder internal rotation FIR [facility] FIR WFL    Shoulder external rotation FER T2 FER T1    Elbow flexion      Elbow extension      Wrist flexion      Wrist extension      Wrist ulnar deviation      Wrist radial deviation      Wrist pronation      Wrist supination      (Blank rows = not tested)  UPPER EXTREMITY MMT:   MMT Right 12/24/22 Left 12/24/22 R 01/27/23 L 01/27/23  Shoulder  flexion 4+ 4+ 5 5  Shoulder extension 4+ 4+ 5 5  Shoulder abduction 4+ 4+ 5 5  Shoulder adduction      Shoulder internal rotation 4+ 4+ 5 5  Shoulder external rotation 4 4- 4+ 4+  Middle trapezius      Lower trapezius      Neck (Blank rows = not tested)  MUSCLE LENGTH: Hamstrings: Mild tight L>R ITB: NT Piriformis: Mild tight L>R Hip flexors: Mild tight B  LOWER EXTREMITY ROM: Proximal B LE AROM WFL Active ROM Right eval Left eval  Ankle dorsiflexion 20 20  Ankle plantarflexion 42 40  Ankle inversion 34 28  Ankle eversion 24  18   (Blank rows = not tested)  LOWER EXTREMITY MMT:  MMT Right eval Left eval R 01/27/23 L 01/27/23  Hip flexion 4- 4- 4 4  Hip extension 3+ 3+ 3+ 3+  Hip abduction 4 4 4 4   Hip adduction 4- 4- 4 4  Hip internal rotation 4+ 4 4+ 4+  Hip external rotation 4- 4- 4 4  Knee flexion 4+ 4+ 5 5  Knee extension 4+ 4+ 5 5  Ankle dorsiflexion 4+ 4+ 5 5  Ankle plantarflexion 5 5 5 5   Ankle inversion 4- 4- 4+ 4+  Ankle eversion 4- 4- 4+ 4   (Blank rows = not tested)  FUNCTIONAL TESTS:  5 times sit to stand: 21.44 sec with need for B UE assist - limited by B knee pain  Timed up and go (TUG): 16.09 sec w/o AD; >13.5 sec indicates high risk for falls 10 meter walk test: 12.25 sec w/o AD, Gait speed = 2.68 ft sec w/o AD Berg Balance Scale: 38/56; 37-45 significant risk for falls (>80%) - 12/24/22  Functional gait assessment: 14/30; < 19 = high risk fall  01/19/23: TUG = 12.10 sec w/o AD = 11.38 sec Gait speed = 2.88 ft/sec FGA = 17/30; < 19 = high risk fall  01/25/23: Berg = 42/56; 37-45 significant risk for falls (>80%)  As of 07/20/22 PT eval: 5 times sit to stand: 21.4 sec  Berg Balance Scale: 42/56 Significant risk for falls Dynamic Gait Index: 18/24 (07/28/22) MCTSIB: Condition 1: Avg of 3 trials: 30 sec, Condition 2: Avg of 3 trials: 30 sec, Condition 3: Avg of 3 trials: 30 sec, Condition 4: Avg of 3 trials: 30 sec, and Total Score:  120/120  BED MOBILITY:  Sit to supine Complete Independence Supine to sit Complete Independence Rolling to Right SBA  TRANSFERS: Assistive device utilized: None  Sit to stand: Modified independence Stand to sit: Modified independence Chair to chair: Modified independence Floor:  NT  GAIT: Distance walked: Clinic distances Assistive device utilized: Single point cane and None Level of assistance: Complete Independence and Modified independence Gait pattern:  without AD, step through pattern, decreased step length- Right, decreased hip/knee flexion- Left, wide BOS, and poor foot clearance- Left, Decreased heel strike on left -  Comments: with cane: improved gait pattern and patient confidence   RAMP: Level of Assistance:  NT Assistive device utilized:    Ramp Comments:   CURB:  Level of Assistance:  NT Assistive device utilized:    Curb Comments:   STAIRS:  Level of Assistance: SBA  Stair Negotiation Technique: Step to Pattern with Single Rail on Right - B hands on rail on ascent  Number of Stairs: 7   Height of Stairs: 7"  Comments: pulls up with B UE leading with L leg  TODAY'S TREATMENT:  01/31/23 THERAPEUTIC EXERCISE: to improve flexibility, strength and mobility.  Demonstration, verbal and tactile cues throughout for technique.  NuStep - L5 x 6 min  B side-stepping along counter with looped RTB at ankles 3 x 10 ft Fwd monster walk with looped RTB at ankles 4 x 10 ft Backward monster walk with looped RTB at ankles 2 x 10 ft Seated hip ABD/ER clam 2 x 10 - 1st set with looped RTB at knee, 2nd set with GTB Seated GTB hip flexion march 2 x 10 Standing hip extension + slight ABD (glute medius) with looped RTB at ankles x 10 Standing hip flexion march with looped RTB at [employer]  x 10 STS with looped GTB hip ABD isometric at knees x 10 - UE assist as needed Standing YTB scap retraction + B shoulder rows 10 x 3" Standing YTB scap retraction + B shoulder extension 10 x  3"  NEUROMUSCULAR RE-EDUCATION: To improve posture, balance, proprioception, and coordination. Corner balance progression with 1/2 tandem stance (bilateral) on firm surface with arms at sides            Eyes open: - static stance x 30 sec - horiz head turns x 5 - vertical head nods x 5 - trunk rotation x 5 Eyes closed: - static stance x 10 sec   01/27/23 THERAPEUTIC EXERCISE: to improve flexibility, strength and mobility.  Demonstration, verbal and tactile cues throughout for technique.  NuStep - L5 x 6 min   THERAPEUTIC ACTIVITIES: ABC scale: 1050 / 1600 = 65.6 % LEFS: 50 / 80 = 62.5 % UE/LE MMT Goal assessment   01/25/23 THERAPEUTIC EXERCISE: to improve flexibility, strength and mobility.  Demonstration, verbal and tactile cues throughout for technique.  NuStep - L5 x 7 min  BERG: 42/56 - still in significant fall risk category SLS + 5-way toe tap in direction of colored dots (not quite able to reach foot to dots) Fwd step and reach 2 x 10 each side Seated marching 2 x 10 BLE 2#  Seated LAQ x 5 BLE 2#   PATIENT EDUCATION:  Education details: HEP progression - advanced seated exercises to GTB and added YTB to postural strengthening; progression of foot placement during corner balance and continue with current HEP Person educated: Patient Education method: Explanation, Demonstration, Verbal cues, and Handouts Education comprehension: verbalized understanding, returned demonstration, verbal cues required, and needs further education  HOME EXERCISE PROGRAM: Access Code: [email or web address] URL: [email or web address] Date: 01/31/2023 Prepared by: Glenetta Hew  Exercises - Seated Plantar Fascia Stretch (Mirrored)  - 1-2 x daily - 7 x weekly - 3 reps - 30 sec hold - Plantar Fascia Stretch on Step (Mirrored)  - 1-2 x daily - 7 x weekly - 3 reps - 30 sec hold - Foot Roller Plantar Massage  - 1-2 x daily - 7 x weekly - 2-3 min hold - Seated Piriformis Stretch  - 1-2 x  daily - 7 x weekly - 3 reps - 30 sec hold - Seated Scapular Resetting  - 1 x daily - 7 x weekly - 2 sets - 10 reps - 3 sec hold - Standing Anatomical Position with Scapular Retraction and Depression at Wall  - 1-2 x daily - 7 x weekly - 2 sets - 10 reps - 3 sec hold - Standing Lumbar Extension at Wall - Forearms  - 1-2 x daily - 7 x weekly - 2 sets - 10 reps - 3-5 sec hold - Seated March with Resistance  - 1 x daily - 3 x weekly - 2 sets - 10 reps - 3 sec hold - Seated Hip Abduction with Resistance  - 1 x daily - 3 x weekly - 2 sets - 10 reps - 3 sec hold - Sit to Stand with Resistance Around Legs  - 1 x daily - 3 x weekly - 2 sets - 10 reps - Side Stepping with Resistance at Thighs and Counter Support  - 1 x daily - 3 x weekly - 2 sets - 10 reps - 3 sec hold - Standing Hip Extension with Resistance at Ankles and Unilateral Counter Support  - 1 x daily - 3 x weekly - 2  sets - 10 reps - 3 sec hold - Corner Balance Feet Together With Eyes Open  - 1 x daily - 7 x weekly - 3 reps - 30 sec hold - Corner Balance Feet Together: Eyes Open With Head Turns  - 1 x daily - 7 x weekly - 2 sets - 5 reps - Corner Balance Feet Together: Eyes Closed With Head Turns  - 1 x daily - 7 x weekly - 2 sets - 10 reps - 3 sec hold - Scapular Retraction with Resistance Advanced  - 1 x daily - 3 x weekly - 2 sets - 10 reps - 5 sec hold - Semi-Tandem Corner Balance With Eyes Open  - 1 x daily - 7 x weekly - 2 reps - 30 sec hold   ASSESSMENT:  CLINICAL IMPRESSION: Tamar notes some "awareness" of her R hip today but denies pain.  Reviewed and progressed HEP strengthening exercises to address ongoing weakness from recent MMT, with seated exercises progressed to GTB and standing exercises remaining at RTB. YTB resistance added to postural/core strengthening. Also progressed foot position for corner balance activities with good tolerance and no LOB.  Meesha will benefit from continued skilled PT to address ongoing deficits to improve  mobility and activity tolerance with decreased pain interference and decreased risk for falls, with hopeful transition to her HEP at the end of her current POC.   OBJECTIVE IMPAIRMENTS: Abnormal gait, decreased activity tolerance, decreased balance, decreased coordination, decreased knowledge of condition, decreased knowledge of use of DME, decreased mobility, difficulty walking, decreased strength, decreased safety awareness, improper body mechanics, postural dysfunction, and pain.   ACTIVITY LIMITATIONS: carrying, lifting, standing, squatting, stairs, transfers, bed mobility, bathing, toileting, dressing, reach over head, and locomotion level  PARTICIPATION LIMITATIONS: meal prep, cleaning, laundry, shopping, and community activity  PERSONAL FACTORS: Age, Past/current experiences, Time since onset of injury/illness/exacerbation, and 3+ comorbidities: Anxiety, CAD, HTN, LBP, B knee pain, GERD  are also affecting patient's functional outcome.   REHAB POTENTIAL: Good  CLINICAL DECISION MAKING: Evolving/moderate complexity  EVALUATION COMPLEXITY: Moderate   GOALS: Goals reviewed with patient? Yes  SHORT TERM GOALS: Target date: 01/05/2023  Patient will be independent with initial HEP. Baseline:  Goal status: MET - 01/13/23   2.  Patient will verbalize understanding of strategies to decrease risk of falls. Baseline: Education on fall prevention techniques provided during last PT episode Goal status: MET - 01/03/23  3.  Patient will demonstrate decreased TUG time to </= 13.5 sec to decrease risk for falls with transitional mobility. Baseline: 16.09 sec without AD; 01/03/23 - 16.01 sec w/o AD Goal status: MET - 01/19/23 - 12.10 sec w/o AD  LONG TERM GOALS: Target date: 02/09/2023  Patient will be independent with advanced/ongoing HEP to improve outcomes and carryover.  Baseline:  Goal status: IN PROGRESS - 01/19/23 - Met for current HEP  2.  Patient will report at least 50-75%  improvement in L foot pain to improve QOL. Baseline: 0/10 following injection 12/14/22 Goal status: MET - 01/19/23  3.  Patient will demonstrate improved overall B UE strength to >/= 4+/5 for functional UE use. Baseline: Refer to above UE MMT table (12/24/22) Goal status: MET - 01/27/23  4.  Patient will demonstrate improved B LE strength to >/= 4+/5 for improved stability and ease of mobility. Baseline: Refer to above LE MMT table Goal status: PARTIALLY MET - 01/27/23 - Met for knee and ankles except L eversion 4/5, hips remain weaker  5.  Patient  will be able to ambulate 600' with or w/o LRAD and normal gait pattern with good safety to access community.  Baseline:  Goal status: IN PROGRESS - 01/27/23 - Pt able to walk community distances (from parking lot into clinic or store)  6. Patient will be able to ascend/descend curb safely with or w/o LRAD for safety with community ambulation.  Baseline:  Goal status: IN PROGRESS - 01/27/23 - pt lacks confidence w/o UE support  7.  Patient will report >/= 70% on ABC scale to demonstrate improved balance confidence with functional mobility.  Baseline: 930 / 1600 = 58.1 % Goal status: IN PROGRESS - 01/27/23 - 1050 / 1600 = 65.6 %  8.  Patient will improve Berg score by at least 8 points (46/56) to improve safety and stability with ADLs in standing and reduce risk for falls.  Baseline: 38/56 Goal status: IN PROGRESS - 01/25/23 - 42/56  9.  Patient will improve FGA score to >/= 19/30 to improve gait stability and reduce risk for falls. Baseline: 14/30 Goal status: IN PROGRESS - 01/19/23 - 17/30  10.  Patient will report >/= 66% on LEFS to demonstrate improved functional ability. Baseline: 46 / 80 = 57.5 % Goal status: IN PROGRESS - 01/27/23 - 50 / 80 = 62.5 %   PLAN:  PT FREQUENCY: 2x/week  PT DURATION: 6-8 weeks  PLANNED INTERVENTIONS: Therapeutic exercises, Therapeutic activity, Neuromuscular re-education, Balance training, Gait  training, Patient/Family education, Self Care, Joint mobilization, Joint manipulation, Stair training, Dry Needling, Electrical stimulation, Cryotherapy, Moist heat, Taping, Vasopneumatic device, Ultrasound, Manual therapy, and Re-evaluation  PLAN FOR NEXT SESSION:  progress LE and postural strengthening - review & update HEP as indicated; balance training   Marry Guan, PT 01/31/2023, 5:54 PM

## 2023-02-01 ENCOUNTER — Other Ambulatory Visit: Payer: Self-pay | Admitting: Family Medicine

## 2023-02-01 DIAGNOSIS — F419 Anxiety disorder, unspecified: Secondary | ICD-10-CM

## 2023-02-01 DIAGNOSIS — E7800 Pure hypercholesterolemia, unspecified: Secondary | ICD-10-CM

## 2023-02-01 DIAGNOSIS — I10 Essential (primary) hypertension: Secondary | ICD-10-CM

## 2023-02-01 NOTE — Telephone Encounter (Signed)
Requesting: clorazepate 3.75mg  Contract: 03/25/22 UDS: 03/25/22 Last Visit: 08/18/22 Next Visit: 02/17/23 Last Refill: 11/16/22 #60 and 0RF    Please Advise

## 2023-02-02 ENCOUNTER — Encounter: Payer: Self-pay | Admitting: Physical Therapy

## 2023-02-02 ENCOUNTER — Ambulatory Visit: Payer: Medicare HMO | Admitting: Physical Therapy

## 2023-02-02 DIAGNOSIS — R2681 Unsteadiness on feet: Secondary | ICD-10-CM

## 2023-02-02 DIAGNOSIS — M6281 Muscle weakness (generalized): Secondary | ICD-10-CM

## 2023-02-02 DIAGNOSIS — R2689 Other abnormalities of gait and mobility: Secondary | ICD-10-CM

## 2023-02-02 DIAGNOSIS — M79672 Pain in left foot: Secondary | ICD-10-CM

## 2023-02-02 NOTE — Therapy (Signed)
OUTPATIENT PHYSICAL THERAPY TREATMENT     Patient Name: [name] MRN: [id number] DOB:08-20-1926, 89 y.o., female Today's Date: 02/02/2023  END OF SESSION:  PT End of Session - 02/02/23 1110     Visit Number 12    Date for PT Re-Evaluation 02/09/23    Authorization Type [employer]    Progress Note Due on Visit 20    PT Start Time 1110    PT Stop Time 1152    PT Time Calculation (min) 42 min    Activity Tolerance Patient tolerated treatment well    Behavior During Therapy WFL for tasks assessed/performed                   Past Medical History:  Diagnosis Date   Aortic valve sclerosis    CAD (coronary artery disease)    S/p DES to the proximal RCA, POBA to the distal RCA 8/08 //Myoview 2009 low risk // Myoview 9/22: EF 57, no ischemia or infarction, apical thinning artifact, low risk study   DUB (dysfunctional uterine bleeding)    Elevated cholesterol    Atorvastatin   GERD (gastroesophageal reflux disease)    History of echocardiogram    EF 60-65, GRII DD, GLS -18.4, normal RVSF, moderate MAC, moderate AV calcification, trivial AI   Hypertension    Plantar fasciitis    Shingles    Past Surgical History:  Procedure Laterality Date   APPENDECTOMY  2011   CARDIAC STINT IMPLANT  2008   CARPAL TUNNEL RELEASE     EYE SURGERY     INGUINAL HERNIA REPAIR     OOPHORECTOMY  2001   LAVH,BSO   ULNA NERVE RELEASE  2009   VAGINAL HYSTERECTOMY  2001   LAVH, BSO   Patient Active Problem List   Diagnosis Date Noted   High risk medication use 03/25/2022   Bilateral sensorineural hearing loss 12/29/2021   Anxiety 11/24/2021   Aortic valve sclerosis 02/24/2021   Varicose veins of both lower extremities 02/24/2021   Left knee pain 04/28/2015   Coronary artery disease involving native coronary artery of native heart without angina pectoris 11/22/2013   Essential hypertension 11/22/2013   Pure hypercholesterolemia 11/22/2013   Inversion, nipple 09/05/2013    Shingles    Elevated cholesterol    Cancer (HCC)    Right knee pain 05/10/2011    PCP: Clayborne Dana, NP   REFERRING PROVIDER: Clayborne Dana, NP (balance & weakness); McCaughan, Dia D, DPM (plantar fascial fibromatosis)  REFERRING DIAG:  R26.89 (ICD-10-CM) - Balance problems  R53.1 (ICD-10-CM) - Weakness  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 L knee pain remains intermittent.  EVAL:  Pt was receiving PT earlier this year for balance problems but had to stop due to developing shingles. After the shingles resolved she developed L plantar fasciitis which further limited her mobility exacerbating her overall weakness and balance issues. She received an injection for the plantar fasciitis yesterday which has helped a lot - no pain today. New PT referral received to "Eval & treat" for the plantar fasciitis in addition  to initial referral for balance and weakness. She notes difficulty with tasks where she has to lift overhead (dishes into the cabinets) and feels unsteady. She has a cane which she brought to PT today but states she has not been using it normally. She tries to walk 10 minute daily in her house but has not kept up with the HEP from her last PT episode.  PAIN: Are you having pain? Yes: NPRS scale: 0-2/10 Pain location: L knee Pain description: dull Aggravating factors: unpredictable Relieving factors: "let it run its course"  PERTINENT HISTORY: Anxiety, CAD, HTN, LBP, B knee pain, GERD, shingles (June 2024), plantar fasciitis (July/Aug 2024)  PRECAUTIONS: Fall  HAND DOMINANCE:  Right  RED FLAGS: None  WEIGHT BEARING RESTRICTIONS: No  FALLS:  Has patient fallen in last 6 months? 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 increased calcaneal inclination angle on lateral view.  Inferior calcaneal spur present.  No fracture noted  PATIENT SURVEYS:  ABC scale 930 / 1600 = 58.1 % LEFS 46 / 80 = 57.5 %  01/27/23: ABC scale: 1050 / 1600 = 65.6 % LEFS: 50 / 80 = 62.5 %  ABC scale as of 07/20/2022 PT eval: 61.3%  COGNITION: Overall cognitive status: Within functional limits for tasks assessed     SENSATION: WFL  EDEMA:  Chronic B ankle edema  POSTURE:  rounded shoulders, forward head, and increased thoracic kyphosis  UPPER EXTREMITY ROM:   Active ROM Right 12/24/22 Left 12/24/22 R 01/27/23 L 01/27/23  Shoulder flexion 129 121 128 122  Shoulder extension 55 54    Shoulder abduction 140 114 129 114  Shoulder adduction      Shoulder internal rotation FIR [facility] FIR WFL    Shoulder external rotation FER T2 FER T1    Elbow flexion      Elbow extension      Wrist flexion      Wrist extension      Wrist ulnar deviation      Wrist radial deviation      Wrist pronation      Wrist supination      (Blank rows = not tested)  UPPER EXTREMITY MMT:   MMT Right 12/24/22 Left 12/24/22 R 01/27/23 L 01/27/23  Shoulder  flexion 4+ 4+ 5 5  Shoulder extension 4+ 4+ 5 5  Shoulder abduction 4+ 4+ 5 5  Shoulder adduction      Shoulder internal rotation 4+ 4+ 5 5  Shoulder external rotation 4 4- 4+ 4+  Middle trapezius      Lower trapezius      Neck (Blank rows = not tested)  MUSCLE LENGTH: Hamstrings: Mild tight L>R ITB: NT Piriformis: Mild tight L>R Hip flexors: Mild tight B  LOWER EXTREMITY ROM: Proximal B LE AROM WFL Active ROM Right eval Left eval  Ankle dorsiflexion 20 20  Ankle plantarflexion 42 40  Ankle inversion 34  28  Ankle eversion 24 18   (Blank rows = not tested)  LOWER EXTREMITY MMT:  MMT Right eval Left eval R 01/27/23 L 01/27/23  Hip flexion 4- 4- 4 4  Hip extension 3+ 3+ 3+ 3+  Hip abduction 4 4 4 4   Hip adduction 4- 4- 4 4  Hip internal rotation 4+ 4 4+ 4+  Hip external rotation 4- 4- 4 4  Knee flexion 4+ 4+ 5 5  Knee extension 4+ 4+ 5 5  Ankle dorsiflexion 4+ 4+ 5 5  Ankle plantarflexion 5 5 5 5   Ankle inversion 4- 4- 4+ 4+  Ankle eversion 4- 4- 4+ 4   (Blank rows = not tested)  FUNCTIONAL TESTS:  5 times sit to stand: 21.44 sec with need for B UE assist - limited by B knee pain  Timed up and go (TUG): 16.09 sec w/o AD; >13.5 sec indicates high risk for falls 10 meter walk test: 12.25 sec w/o AD, Gait speed = 2.68 ft sec w/o AD Berg Balance Scale: 38/56; 37-45 significant risk for falls (>80%) - 12/24/22  Functional gait assessment: 14/30; < 19 = high risk fall  01/19/23: TUG = 12.10 sec w/o AD = 11.38 sec Gait speed = 2.88 ft/sec FGA = 17/30; < 19 = high risk fall  01/25/23: Berg = 42/56; 37-45 significant risk for falls (>80%)  As of 07/20/22 PT eval: 5 times sit to stand: 21.4 sec  Berg Balance Scale: 42/56 Significant risk for falls Dynamic Gait Index: 18/24 (07/28/22) MCTSIB: Condition 1: Avg of 3 trials: 30 sec, Condition 2: Avg of 3 trials: 30 sec, Condition 3: Avg of 3 trials: 30 sec, Condition 4: Avg of 3 trials: 30 sec, and Total Score:  120/120  BED MOBILITY:  Sit to supine Complete Independence Supine to sit Complete Independence Rolling to Right SBA  TRANSFERS: Assistive device utilized: None  Sit to stand: Modified independence Stand to sit: Modified independence Chair to chair: Modified independence Floor:  NT  GAIT: Distance walked: Clinic distances Assistive device utilized: Single point cane and None Level of assistance: Complete Independence and Modified independence Gait pattern:  without AD, step through pattern, decreased step length- Right, decreased hip/knee flexion- Left, wide BOS, and poor foot clearance- Left, Decreased heel strike on left -  Comments: with cane: improved gait pattern and patient confidence   RAMP: Level of Assistance:  NT Assistive device utilized:    Ramp Comments:   CURB:  Level of Assistance:  NT Assistive device utilized:    Curb Comments:   STAIRS:  Level of Assistance: SBA  Stair Negotiation Technique: Step to Pattern with Single Rail on Right - B hands on rail on ascent  Number of Stairs: 7   Height of Stairs: 7"  Comments: pulls up with B UE leading with L leg  TODAY'S TREATMENT:  02/02/23 THERAPEUTIC EXERCISE: to improve flexibility, strength and mobility.  Demonstration, verbal and tactile cues throughout for technique.  NuStep - L5 x 6 min  Standing RTB scap retraction + B shoulder rows 10 x 3" Standing RTB scap retraction + B shoulder extension 10 x 3" Fitter (1 black/1 blue) hip ABD x 10 bil, UE support on back of chair for balance Fitter (1 black/1 blue) hip extension x 10 bil, UE support on back of chair for balance  NEUROMUSCULAR RE-EDUCATION: To improve posture, balance, proprioception, coordination, and reduce fall risk. Standing on Airex pad: Lateral weight shift x 20 Heel/toe (ant/post) weight shift x  20 Stepping in place x 20 Mini squat x 10 Reaching outside of base of support for beanbags and toss into bucket x 11 with each hand A/P cone  tap down and righting in SLS x 4 with each foot   01/31/23 THERAPEUTIC EXERCISE: to improve flexibility, strength and mobility.  Demonstration, verbal and tactile cues throughout for technique.  NuStep - L5 x 6 min  B side-stepping along counter with looped RTB at ankles 3 x 10 ft Fwd monster walk with looped RTB at ankles 4 x 10 ft Backward monster walk with looped RTB at ankles 2 x 10 ft Seated hip ABD/ER clam 2 x 10 - 1st set with looped RTB at knee, 2nd set with GTB Seated GTB hip flexion march 2 x 10 Standing hip extension + slight ABD (glute medius) with looped RTB at ankles x 10 Standing hip flexion march with looped RTB at midfeet x 10 STS with looped GTB hip ABD isometric at knees x 10 - UE assist as needed Standing YTB scap retraction + B shoulder rows 10 x 3" Standing YTB scap retraction + B shoulder extension 10 x 3"  NEUROMUSCULAR RE-EDUCATION: To improve posture, balance, proprioception, and coordination. Corner balance progression with 1/2 tandem stance (bilateral) on firm surface with arms at sides            Eyes open: - static stance x 30 sec - horiz head turns x 5 - vertical head nods x 5 - trunk rotation x 5 Eyes closed: - static stance x 10 sec   01/27/23 THERAPEUTIC EXERCISE: to improve flexibility, strength and mobility.  Demonstration, verbal and tactile cues throughout for technique.  NuStep - L5 x 6 min   THERAPEUTIC ACTIVITIES: ABC scale: 1050 / 1600 = 65.6 % LEFS: 50 / 80 = 62.5 % UE/LE MMT Goal assessment   PATIENT EDUCATION:  Education details: HEP review, HEP progression - advanced YTB to RTB for postural strengthening; progression of foot placement during corner balance, and continue with current HEP Person educated: Patient Education method: Explanation, Demonstration, and Verbal cues Education comprehension: verbalized understanding, returned demonstration, verbal cues required, and needs further education  HOME EXERCISE PROGRAM: Access  Code: [email or web address] URL: [email or web address] Date: 01/31/2023 Prepared by: Glenetta Hew  Exercises - Seated Plantar Fascia Stretch (Mirrored)  - 1-2 x daily - 7 x weekly - 3 reps - 30 sec hold - Plantar Fascia Stretch on Step (Mirrored)  - 1-2 x daily - 7 x weekly - 3 reps - 30 sec hold - Foot Roller Plantar Massage  - 1-2 x daily - 7 x weekly - 2-3 min hold - Seated Piriformis Stretch  - 1-2 x daily - 7 x weekly - 3 reps - 30 sec hold - Seated Scapular Resetting  - 1 x daily - 7 x weekly - 2 sets - 10 reps - 3 sec hold - Standing Anatomical Position with Scapular Retraction and Depression at Wall  - 1-2 x daily - 7 x weekly - 2 sets - 10 reps - 3 sec hold - Standing Lumbar Extension at Wall - Forearms  - 1-2 x daily - 7 x weekly - 2 sets - 10 reps - 3-5 sec hold - Seated March with Resistance  - 1 x daily - 3 x weekly - 2 sets - 10 reps - 3 sec hold - Seated Hip Abduction with Resistance  - 1 x daily - 3 x weekly - 2 sets - 10 reps - 3 sec hold -  Sit to Stand with Resistance Around Legs  - 1 x daily - 3 x weekly - 2 sets - 10 reps - Side Stepping with Resistance at Thighs and Counter Support  - 1 x daily - 3 x weekly - 2 sets - 10 reps - 3 sec hold - Standing Hip Extension with Resistance at Ankles and Unilateral Counter Support  - 1 x daily - 3 x weekly - 2 sets - 10 reps - 3 sec hold - Corner Balance Feet Together With Eyes Open  - 1 x daily - 7 x weekly - 3 reps - 30 sec hold - Corner Balance Feet Together: Eyes Open With Head Turns  - 1 x daily - 7 x weekly - 2 sets - 5 reps - Corner Balance Feet Together: Eyes Closed With Head Turns  - 1 x daily - 7 x weekly - 2 sets - 10 reps - 3 sec hold - Scapular Retraction with Resistance Advanced  - 1 x daily - 3 x weekly - 2 sets - 10 reps - 5 sec hold - Semi-Tandem Corner Balance With Eyes Open  - 1 x daily - 7 x weekly - 2 reps - 30 sec hold   ASSESSMENT:  CLINICAL IMPRESSION: Eliany reports no concerns with majority of exercises  reviewed and progressed last visit, however she did request clarification of the resisted postural exercises.  We reviewed the set up and technique for these exercises and progressed the resistance to RTB as patient felt like YTB was kind of easy.  She was unable to do identify any specific concerns related to balance or performance of daily activities at home, therefore remainder of session focusing on balance training for improved stability and decrease fall risk.  Mirai has 1 visit remaining in her current POC and feels like she will be ready to transition to her HEP at that time.   OBJECTIVE IMPAIRMENTS: Abnormal gait, decreased activity tolerance, decreased balance, decreased coordination, decreased knowledge of condition, decreased knowledge of use of DME, decreased mobility, difficulty walking, decreased strength, decreased safety awareness, improper body mechanics, postural dysfunction, and pain.   ACTIVITY LIMITATIONS: carrying, lifting, standing, squatting, stairs, transfers, bed mobility, bathing, toileting, dressing, reach over head, and locomotion level  PARTICIPATION LIMITATIONS: meal prep, cleaning, laundry, shopping, and community activity  PERSONAL FACTORS: Age, Past/current experiences, Time since onset of injury/illness/exacerbation, and 3+ comorbidities: Anxiety, CAD, HTN, LBP, B knee pain, GERD  are also affecting patient's functional outcome.   REHAB POTENTIAL: Good  CLINICAL DECISION MAKING: Evolving/moderate complexity  EVALUATION COMPLEXITY: Moderate   GOALS: Goals reviewed with patient? Yes  SHORT TERM GOALS: Target date: 01/05/2023  Patient will be independent with initial HEP. Baseline:  Goal status: MET - 01/13/23   2.  Patient will verbalize understanding of strategies to decrease risk of falls. Baseline: Education on fall prevention techniques provided during last PT episode Goal status: MET - 01/03/23  3.  Patient will demonstrate decreased TUG time to </=  13.5 sec to decrease risk for falls with transitional mobility. Baseline: 16.09 sec without AD; 01/03/23 - 16.01 sec w/o AD Goal status: MET - 01/19/23 - 12.10 sec w/o AD  LONG TERM GOALS: Target date: 02/09/2023  Patient will be independent with advanced/ongoing HEP to improve outcomes and carryover.  Baseline:  Goal status: PARTIALLY MET - 02/02/23 - Met for current HEP  2.  Patient will report at least 50-75% improvement in L foot pain to improve QOL. Baseline: 0/10 following injection 12/14/22 Goal  status: MET - 01/19/23  3.  Patient will demonstrate improved overall B UE strength to >/= 4+/5 for functional UE use. Baseline: Refer to above UE MMT table (12/24/22) Goal status: MET - 01/27/23  4.  Patient will demonstrate improved B LE strength to >/= 4+/5 for improved stability and ease of mobility. Baseline: Refer to above LE MMT table Goal status: PARTIALLY MET - 01/27/23 - Met for knee and ankles except L eversion 4/5, hips remain weaker  5.  Patient will be able to ambulate 600' with or w/o LRAD and normal gait pattern with good safety to access community.  Baseline:  Goal status: IN PROGRESS - 01/27/23 - Pt able to walk community distances (from parking lot into clinic or store)  6. Patient will be able to ascend/descend curb safely with or w/o LRAD for safety with community ambulation.  Baseline:  Goal status: IN PROGRESS - 01/27/23 - pt lacks confidence w/o UE support  7.  Patient will report >/= 70% on ABC scale to demonstrate improved balance confidence with functional mobility.  Baseline: 930 / 1600 = 58.1 % Goal status: IN PROGRESS - 01/27/23 - 1050 / 1600 = 65.6 %  8.  Patient will improve Berg score by at least 8 points (46/56) to improve safety and stability with ADLs in standing and reduce risk for falls.  Baseline: 38/56 Goal status: IN PROGRESS - 01/25/23 - 42/56  9.  Patient will improve FGA score to >/= 19/30 to improve gait stability and reduce risk for  falls. Baseline: 14/30 Goal status: IN PROGRESS - 01/19/23 - 17/30  10.  Patient will report >/= 66% on LEFS to demonstrate improved functional ability. Baseline: 46 / 80 = 57.5 % Goal status: IN PROGRESS - 01/27/23 - 50 / 80 = 62.5 %   PLAN:  PT FREQUENCY: 2x/week  PT DURATION: 6-8 weeks  PLANNED INTERVENTIONS: Therapeutic exercises, Therapeutic activity, Neuromuscular re-education, Balance training, Gait training, Patient/Family education, Self Care, Joint mobilization, Joint manipulation, Stair training, Dry Needling, Electrical stimulation, Cryotherapy, Moist heat, Taping, Vasopneumatic device, Ultrasound, Manual therapy, and Re-evaluation  PLAN FOR NEXT SESSION: Reassess LTG's; review any concerns with HEP; anticipate transition to HEP +/- 30-day hold  Marry Guan, PT 02/02/2023, 12:08 PM

## 2023-02-07 ENCOUNTER — Ambulatory Visit: Payer: Medicare HMO | Admitting: Physical Therapy

## 2023-02-07 ENCOUNTER — Encounter: Payer: Self-pay | Admitting: Physical Therapy

## 2023-02-07 DIAGNOSIS — R2689 Other abnormalities of gait and mobility: Secondary | ICD-10-CM

## 2023-02-07 DIAGNOSIS — M6281 Muscle weakness (generalized): Secondary | ICD-10-CM

## 2023-02-07 DIAGNOSIS — R2681 Unsteadiness on feet: Secondary | ICD-10-CM

## 2023-02-07 DIAGNOSIS — M79672 Pain in left foot: Secondary | ICD-10-CM

## 2023-02-07 NOTE — Therapy (Signed)
OUTPATIENT PHYSICAL THERAPY TREATMENT    Progress Note  Reporting Period 01/27/2023 to 02/07/2023   See note below for Objective Data and Assessment of Progress/Goals.     Patient Name: [name] MRN: [id number] DOB:10-28-1926, 89 y.o., female Today's Date: 02/07/2023  END OF SESSION:  PT End of Session - 02/07/23 1149     Visit Number 13    Date for PT Re-Evaluation 02/09/23    Authorization Type [employer]    Progress Note Due on Visit 20    PT Start Time 1149    PT Stop Time 1240    PT Time Calculation (min) 51 min    Activity Tolerance Patient tolerated treatment well    Behavior During Therapy WFL for tasks assessed/performed                   Past Medical History:  Diagnosis Date   Aortic valve sclerosis    CAD (coronary artery disease)    S/p DES to the proximal RCA, POBA to the distal RCA 8/08 //Myoview 2009 low risk // Myoview 9/22: EF 57, no ischemia or infarction, apical thinning artifact, low risk study   DUB (dysfunctional uterine bleeding)    Elevated cholesterol    Atorvastatin   GERD (gastroesophageal reflux disease)    History of echocardiogram    EF 60-65, GRII DD, GLS -18.4, normal RVSF, moderate MAC, moderate AV calcification, trivial AI   Hypertension    Plantar fasciitis    Shingles    Past Surgical History:  Procedure Laterality Date   APPENDECTOMY  2011   CARDIAC STINT IMPLANT  2008   CARPAL TUNNEL RELEASE     EYE SURGERY     INGUINAL HERNIA REPAIR     OOPHORECTOMY  2001   LAVH,BSO   ULNA NERVE RELEASE  2009   VAGINAL HYSTERECTOMY  2001   LAVH, BSO   Patient Active Problem List   Diagnosis Date Noted   High risk medication use 03/25/2022   Bilateral sensorineural hearing loss 12/29/2021   Anxiety 11/24/2021   Aortic valve sclerosis 02/24/2021   Varicose veins of both lower extremities 02/24/2021   Left knee pain 04/28/2015   Coronary artery disease involving native coronary artery of native heart without  angina pectoris 11/22/2013   Essential hypertension 11/22/2013   Pure hypercholesterolemia 11/22/2013   Inversion, nipple 09/05/2013   Shingles    Elevated cholesterol    Cancer (HCC)    Right knee pain 05/10/2011    PCP: Clayborne Dana, NP   REFERRING PROVIDER: Clayborne Dana, NP (balance & weakness); McCaughan, Dia D, DPM (plantar fascial fibromatosis)  REFERRING DIAG:  R26.89 (ICD-10-CM) - Balance problems  R53.1 (ICD-10-CM) - Weakness  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  She states she did obtain a restorator bike for use at home.  EVAL:  Pt was receiving PT earlier this year for balance problems but had to stop due to developing shingles. After the shingles resolved she developed L plantar fasciitis which further limited her mobility exacerbating her overall weakness and balance issues.  She received an injection for the plantar fasciitis yesterday which has helped a lot - no pain today. New PT referral received to "Eval & treat" for the plantar fasciitis in addition to initial referral for balance and weakness. She notes difficulty with tasks where she has to lift overhead (dishes into the cabinets) and feels unsteady. She has a cane which she brought to PT today but states she has not been using it normally. She tries to walk 10 minute daily in her house but has not kept up with the HEP from her last PT episode.  PAIN: Are you having pain? No  PERTINENT HISTORY: Anxiety, CAD, HTN, LBP, B knee pain, GERD, shingles (June 2024), plantar fasciitis (July/Aug  2024)  PRECAUTIONS: Fall  HAND DOMINANCE: Right  RED FLAGS: None  WEIGHT BEARING RESTRICTIONS: No  FALLS:  Has patient fallen in last 6 months? 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 increased calcaneal inclination angle on lateral view.  Inferior calcaneal spur present.  No fracture noted  PATIENT SURVEYS:  ABC scale 930 / 1600 = 58.1 % LEFS 46 / 80 = 57.5 %  01/27/23: ABC scale: 1050 / 1600 = 65.6 % LEFS: 50 / 80 = 62.5 %  ABC scale as of 07/20/2022 PT eval: 61.3%  COGNITION: Overall cognitive status: Within functional limits for tasks assessed     SENSATION: WFL  EDEMA:  Chronic B ankle edema  POSTURE:  rounded shoulders, forward head, and increased thoracic kyphosis  UPPER EXTREMITY ROM:   Active ROM Right 12/24/22 Left 12/24/22 R 01/27/23 L 01/27/23  Shoulder flexion 129 121 128 122  Shoulder extension 55 54    Shoulder abduction 140 114 129 114  Shoulder adduction      Shoulder internal rotation FIR [facility] FIR WFL    Shoulder external rotation FER T2 FER T1    Elbow flexion      Elbow extension      Wrist flexion      Wrist extension      Wrist ulnar deviation      Wrist radial deviation      Wrist pronation      Wrist supination      (Blank rows = not tested)  UPPER EXTREMITY MMT:   MMT Right 12/24/22  Left 12/24/22 R 01/27/23 L 01/27/23  Shoulder flexion 4+ 4+ 5 5  Shoulder extension 4+ 4+ 5 5  Shoulder abduction 4+ 4+ 5 5  Shoulder adduction      Shoulder internal rotation 4+ 4+ 5 5  Shoulder external rotation 4 4- 4+ 4+  Middle trapezius      Lower trapezius      Neck (Blank rows = not tested)  MUSCLE LENGTH: Hamstrings: Mild tight L>R ITB: NT Piriformis: Mild tight L>R Hip flexors: Mild tight B  LOWER EXTREMITY ROM: Proximal B LE AROM WFL Active ROM Right eval Left eval  Ankle  dorsiflexion 20 20  Ankle plantarflexion 42 40  Ankle inversion 34 28  Ankle eversion 24 18   (Blank rows = not tested)  LOWER EXTREMITY MMT:  MMT Right eval Left eval R 01/27/23 L 01/27/23 R 02/07/23 L 02/07/23  Hip flexion 4- 4- 4 4 4+ 4+  Hip extension 3+ 3+ 3+ 3+ 4- 4-  Hip abduction 4 4 4 4  4+ 4+  Hip adduction 4- 4- 4 4 4+ 4+  Hip internal rotation 4+ 4 4+ 4+ 4+ 4+  Hip external rotation 4- 4- 4 4 4+ 4+  Knee flexion 4+ 4+ 5 5    Knee extension 4+ 4+ 5 5    Ankle dorsiflexion 4+ 4+ 5 5    Ankle plantarflexion 5 5 5 5     Ankle inversion 4- 4- 4+ 4+    Ankle eversion 4- 4- 4+ 4  4+   (Blank rows = not tested)  FUNCTIONAL TESTS:  5 times sit to stand: 21.44 sec with need for B UE assist - limited by B knee pain  Timed up and go (TUG): 16.09 sec w/o AD; >13.5 sec indicates high risk for falls 10 meter walk test: 12.25 sec w/o AD, Gait speed = 2.68 ft sec w/o AD Berg Balance Scale: 38/56; 37-45 significant risk for falls (>80%) - 12/24/22  Functional gait assessment: 14/30; < 19 = high risk fall  01/19/23: TUG = 12.10 sec w/o AD = 11.38 sec Gait speed = 2.88 ft/sec FGA = 17/30; < 19 = high risk fall  01/25/23: Berg = 42/56; 37-45 significant risk for falls (>80%)  As of 07/20/22 PT eval: 5 times sit to stand: 21.4 sec  Berg Balance Scale: 42/56 Significant risk for falls Dynamic Gait Index: 18/24 (07/28/22) MCTSIB: Condition 1: Avg of 3 trials: 30 sec, Condition 2: Avg of  3 trials: 30 sec, Condition 3: Avg of 3 trials: 30 sec, Condition 4: Avg of 3 trials: 30 sec, and Total Score: 120/120  BED MOBILITY:  Sit to supine Complete Independence Supine to sit Complete Independence Rolling to Right SBA  TRANSFERS: Assistive device utilized: None  Sit to stand: Modified independence Stand to sit: Modified independence Chair to chair: Modified independence Floor:  NT  GAIT: Distance walked: Clinic distances Assistive device utilized: Single point cane and None Level of assistance: Complete Independence and Modified independence Gait pattern:  without AD, step through pattern, decreased step length- Right, decreased hip/knee flexion- Left, wide BOS, and poor foot clearance- Left, Decreased heel strike on left -  Comments: with cane: improved gait pattern and patient confidence   RAMP: Level of Assistance:  NT Assistive device utilized:    Ramp Comments:   CURB:  Level of Assistance:  NT Assistive device utilized:    Curb Comments:   STAIRS:  Level of Assistance: SBA  Stair Negotiation Technique: Step to Pattern with Single Rail on Right - B hands on rail on ascent  Number of Stairs: 7   Height of Stairs: 7"  Comments: pulls up with B UE leading with L leg  TODAY'S TREATMENT:  02/02/23 THERAPEUTIC EXERCISE: to improve flexibility, strength and mobility.  Demonstration, verbal and tactile cues throughout for technique.  NuStep - L5 x 6 min   THERAPEUTIC ACTIVITIES: ABC scale: 1130 / 1600 = 70.6 % LEFS: 53 / 80 = 66.3 % Berg = 42/56 FGA = 18/30 LE MMT Goal assessment   02/02/23 THERAPEUTIC EXERCISE: to improve flexibility, strength and mobility.  Demonstration, verbal and  tactile cues throughout for technique.  NuStep - L5 x 6 min  Standing RTB scap retraction + B shoulder rows 10 x 3" Standing RTB scap retraction + B shoulder extension 10 x 3" Fitter (1 black/1 blue) hip ABD x 10 bil, UE support on back of chair for balance Fitter (1  black/1 blue) hip extension x 10 bil, UE support on back of chair for balance  NEUROMUSCULAR RE-EDUCATION: To improve posture, balance, proprioception, coordination, and reduce fall risk. Standing on Airex pad: Lateral weight shift x 20 Heel/toe (ant/post) weight shift x 20 Stepping in place x 20 Mini squat x 10 Reaching outside of base of support for beanbags and toss into bucket x 11 with each hand A/P cone tap down and righting in SLS x 4 with each foot   01/31/23 THERAPEUTIC EXERCISE: to improve flexibility, strength and mobility.  Demonstration, verbal and tactile cues throughout for technique.  NuStep - L5 x 6 min  B side-stepping along counter with looped RTB at ankles 3 x 10 ft Fwd monster walk with looped RTB at ankles 4 x 10 ft Backward monster walk with looped RTB at ankles 2 x 10 ft Seated hip ABD/ER clam 2 x 10 - 1st set with looped RTB at knee, 2nd set with GTB Seated GTB hip flexion march 2 x 10 Standing hip extension + slight ABD (glute medius) with looped RTB at ankles x 10 Standing hip flexion march with looped RTB at midfeet x 10 STS with looped GTB hip ABD isometric at knees x 10 - UE assist as needed Standing YTB scap retraction + B shoulder rows 10 x 3" Standing YTB scap retraction + B shoulder extension 10 x 3"  NEUROMUSCULAR RE-EDUCATION: To improve posture, balance, proprioception, and coordination. Corner balance progression with 1/2 tandem stance (bilateral) on firm surface with arms at sides            Eyes open: - static stance x 30 sec - horiz head turns x 5 - vertical head nods x 5 - trunk rotation x 5 Eyes closed: - static stance x 10 sec   PATIENT EDUCATION:  Education details: HEP review, HEP progression - advanced YTB to RTB for postural strengthening; progression of foot placement during corner balance, and continue with current HEP Person educated: Patient Education method: Explanation, Demonstration, and Verbal cues Education  comprehension: verbalized understanding, returned demonstration, verbal cues required, and needs further education  HOME EXERCISE PROGRAM: Access Code: [email or web address] URL: [email or web address] Date: 01/31/2023 Prepared by: Glenetta Hew  Exercises - Seated Plantar Fascia Stretch (Mirrored)  - 1-2 x daily - 7 x weekly - 3 reps - 30 sec hold - Plantar Fascia Stretch on Step (Mirrored)  - 1-2 x daily - 7 x weekly - 3 reps - 30 sec hold - Foot Roller Plantar Massage  - 1-2 x daily - 7 x weekly - 2-3 min hold - Seated Piriformis Stretch  - 1-2 x daily - 7 x weekly - 3 reps - 30 sec hold - Seated Scapular Resetting  - 1 x daily - 7 x weekly - 2 sets - 10 reps - 3 sec hold - Standing Anatomical Position with Scapular Retraction and Depression at Wall  - 1-2 x daily - 7 x weekly - 2 sets - 10 reps - 3 sec hold - Standing Lumbar Extension at Wall - Forearms  - 1-2 x daily - 7 x weekly - 2 sets - 10 reps - 3-5 sec hold - Seated March  with Resistance  - 1 x daily - 3 x weekly - 2 sets - 10 reps - 3 sec hold - Seated Hip Abduction with Resistance  - 1 x daily - 3 x weekly - 2 sets - 10 reps - 3 sec hold - Sit to Stand with Resistance Around Legs  - 1 x daily - 3 x weekly - 2 sets - 10 reps - Side Stepping with Resistance at Thighs and Counter Support  - 1 x daily - 3 x weekly - 2 sets - 10 reps - 3 sec hold - Standing Hip Extension with Resistance at Ankles and Unilateral Counter Support  - 1 x daily - 3 x weekly - 2 sets - 10 reps - 3 sec hold - Corner Balance Feet Together With Eyes Open  - 1 x daily - 7 x weekly - 3 reps - 30 sec hold - Corner Balance Feet Together: Eyes Open With Head Turns  - 1 x daily - 7 x weekly - 2 sets - 5 reps - Corner Balance Feet Together: Eyes Closed With Head Turns  - 1 x daily - 7 x weekly - 2 sets - 10 reps - 3 sec hold - Scapular Retraction with Resistance Advanced  - 1 x daily - 3 x weekly - 2 sets - 10 reps - 5 sec hold - Semi-Tandem Corner Balance With Eyes  Open  - 1 x daily - 7 x weekly - 2 reps - 30 sec hold   ASSESSMENT:  CLINICAL IMPRESSION: Grayce has demonstrated good progress with PT with gains noted in overall LE strength as well as on all standardized balance tests although just shy of goal levels.  UE strength goal met.  Her L foot remains pain free but she has noted variable pain at times in her knees.  Improvements noted in perceived LE function and balance confidence per LEFS and ABC scale with both associated goals met.  Her main functional limitation is with need for increased UE assist/support curb and stair negotiation due to weakness and lack of confidence.  Majority of PT goals now met or partially met and Piya feels ready to transition to her HEP but would like to remain on hold for 30-days in the event that issues arise that would necessitate a return to PT.  OBJECTIVE IMPAIRMENTS: Abnormal gait, decreased activity tolerance, decreased balance, decreased coordination, decreased knowledge of condition, decreased knowledge of use of DME, decreased mobility, difficulty walking, decreased strength, decreased safety awareness, improper body mechanics, postural dysfunction, and pain.   ACTIVITY LIMITATIONS: carrying, lifting, standing, squatting, stairs, transfers, bed mobility, bathing, toileting, dressing, reach over head, and locomotion level  PARTICIPATION LIMITATIONS: meal prep, cleaning, laundry, shopping, and community activity  PERSONAL FACTORS: Age, Past/current experiences, Time since onset of injury/illness/exacerbation, and 3+ comorbidities: Anxiety, CAD, HTN, LBP, B knee pain, GERD  are also affecting patient's functional outcome.   REHAB POTENTIAL: Good  CLINICAL DECISION MAKING: Evolving/moderate complexity  EVALUATION COMPLEXITY: Moderate   GOALS: Goals reviewed with patient? Yes  SHORT TERM GOALS: Target date: 01/05/2023  Patient will be independent with initial HEP. Baseline:  Goal status: MET - 01/13/23    2.  Patient will verbalize understanding of strategies to decrease risk of falls. Baseline: Education on fall prevention techniques provided during last PT episode Goal status: MET - 01/03/23  3.  Patient will demonstrate decreased TUG time to </= 13.5 sec to decrease risk for falls with transitional mobility. Baseline: 16.09 sec without AD; 01/03/23 -  16.01 sec w/o AD Goal status: MET - 01/19/23 - 12.10 sec w/o AD  LONG TERM GOALS: Target date: 02/09/2023  Patient will be independent with advanced/ongoing HEP to improve outcomes and carryover.  Baseline:  Goal status: MET - 02/07/23   2.  Patient will report at least 50-75% improvement in L foot pain to improve QOL. Baseline: 0/10 following injection 12/14/22 Goal status: MET - 01/19/23  3.  Patient will demonstrate improved overall B UE strength to >/= 4+/5 for functional UE use. Baseline: Refer to above UE MMT table (12/24/22) Goal status: MET - 01/27/23  4.  Patient will demonstrate improved B LE strength to >/= 4+/5 for improved stability and ease of mobility. Baseline: Refer to above LE MMT table Goal status: PARTIALLY MET - 02/07/23 - Met except B hip extension 4-/5  5.  Patient will be able to ambulate 600' with or w/o LRAD and normal gait pattern with good safety to access community.  Baseline:  Goal status: MET - 02/07/23 - Pt able to walk limited community distances (from parking lot into clinic or store)  6. Patient will be able to ascend/descend curb safely with or w/o LRAD for safety with community ambulation.  Baseline:  Goal status: NOT MET - 01/27/23 - pt lacks confidence w/o solid UE support  7.  Patient will report >/= 70% on ABC scale to demonstrate improved balance confidence with functional mobility.  Baseline: 930 / 1600 = 58.1 %; 01/27/23 - 1050 / 1600 = 65.6 % Goal status: MET - 02/07/23 - 1130 / 1600 = 70.6 %  8.  Patient will improve Berg score by at least 8 points (46/56) to improve safety and  stability with ADLs in standing and reduce risk for falls.  Baseline: 38/56; 01/25/23 - 42/56 Goal status: NOT MET but IMPROVED - 02/07/23 - 42/56 (4 point improvement from eval)  9.  Patient will improve FGA score to >/= 19/30 to improve gait stability and reduce risk for falls. Baseline: 14/30; 01/19/23 - 17/30 Goal status: PARTIALLY MET - 02/07/23 - 18/30 (4 point improvement from eval)  10.  Patient will report >/= 66% on LEFS to demonstrate improved functional ability. Baseline: 46 / 80 = 57.5 %; 01/27/23 - 50 / 80 = 62.5 % Goal status: MET - 02/07/23 - 53 / 80 = 66.3 %   PLAN:  PT FREQUENCY: 2x/week  PT DURATION: 6-8 weeks  PLANNED INTERVENTIONS: Therapeutic exercises, Therapeutic activity, Neuromuscular re-education, Balance training, Gait training, Patient/Family education, Self Care, Joint mobilization, Joint manipulation, Stair training, Dry Needling, Electrical stimulation, Cryotherapy, Moist heat, Taping, Vasopneumatic device, Ultrasound, Manual therapy, and Re-evaluation  PLAN FOR NEXT SESSION: transition to HEP + 30-day hold  Marry Guan, PT 02/07/2023, 12:57 PM

## 2023-02-17 ENCOUNTER — Encounter: Payer: Self-pay | Admitting: Family Medicine

## 2023-02-17 ENCOUNTER — Ambulatory Visit (INDEPENDENT_AMBULATORY_CARE_PROVIDER_SITE_OTHER): Payer: Medicare HMO | Admitting: Family Medicine

## 2023-02-17 VITALS — BP 140/80 | HR 68 | Ht 64.0 in

## 2023-02-17 DIAGNOSIS — I10 Essential (primary) hypertension: Secondary | ICD-10-CM

## 2023-02-17 DIAGNOSIS — E559 Vitamin D deficiency, unspecified: Secondary | ICD-10-CM | POA: Diagnosis not present

## 2023-02-17 DIAGNOSIS — E7800 Pure hypercholesterolemia, unspecified: Secondary | ICD-10-CM | POA: Diagnosis not present

## 2023-02-17 DIAGNOSIS — F419 Anxiety disorder, unspecified: Secondary | ICD-10-CM

## 2023-02-17 DIAGNOSIS — I2510 Atherosclerotic heart disease of native coronary artery without angina pectoris: Secondary | ICD-10-CM | POA: Diagnosis not present

## 2023-02-17 DIAGNOSIS — E871 Hypo-osmolality and hyponatremia: Secondary | ICD-10-CM

## 2023-02-17 NOTE — Progress Notes (Signed)
Established Patient Office Visit  Subjective   Patient ID: [name], female    DOB: 1926-10-20  Age: 89 y.o. MRN: [id number]  Chief Complaint  Patient presents with   Medical Management of Chronic Issues    HPI  Patient here for routine follow-up. Reports she is doing well overall. No acute concerns.   GFR mildly decreased at last check. Repeat today. Reports she has been staying well hydrated. No new concerns.  Mood follow-up: - Diagnosis: anxiety  - Treatment: clorazepate 3.75 mg BID PRN, she has been taking rarely - Medication side effects: none - SI/HI: none - Update: She does not want to continue. Doesn't feel anxious, but will notice sometimes feel like she's clinching teeth and has tense muscles. She tries to stay active and reports getting good sleep.  Hypertension/CAD: - Medications: amlodipine 5 mg daily, lisinopril 40 mg daily, aspirin 81 mg daily - Compliance: good - Checking BP at home: good - Denies any SOB, recurrent headaches, CP, vision changes, LE edema, dizziness, palpitations, or medication side effects.   Hyperlipidemia: - medications: atorvastatin 40 mg daily - compliance: good - medication SEs: none The ASCVD Risk score (Arnett DK, et al., 2019) failed to calculate for the following reasons:   The 2019 ASCVD risk score is only valid for ages 63 to 23   Vitamin D deficiency: - Treatment: vitamin D 2000 units daily - No new concerns      02/17/2023    2:16 PM 07/01/2022    3:14 PM 05/14/2021    6:38 PM  PHQ9 SCORE ONLY  PHQ-9 Total Score 1 0 0      02/17/2023    2:17 PM  GAD 7 : Generalized Anxiety Score  Nervous, Anxious, on Edge 0  Control/stop worrying 0  Worry too much - different things 0  Trouble relaxing 1  Restless 0  Easily annoyed or irritable 0  Afraid - awful might happen 0  Total GAD 7 Score 1  Anxiety Difficulty Not difficult at all        ROS All review of systems negative except what is listed in the  HPI     Objective:     BP (!) 140/80   Pulse 68   Ht 5\' 4"  (1.626 m)   LMP  (LMP Unknown)   SpO2 96%   BMI 26.95 kg/m    Physical Exam Vitals reviewed.  Constitutional:      General: She is not in acute distress.    Appearance: Normal appearance. She is not ill-appearing.  HENT:     Head: Normocephalic and atraumatic.  Cardiovascular:     Rate and Rhythm: Normal rate and regular rhythm.     Pulses: Normal pulses.     Heart sounds: Murmur heard.  Pulmonary:     Effort: Pulmonary effort is normal.     Breath sounds: Normal breath sounds.  Musculoskeletal:     Cervical back: Normal range of motion and neck supple.     Right lower leg: No edema.     Left lower leg: No edema.  Skin:    General: Skin is warm and dry.  Neurological:     Mental Status: She is alert and oriented to person, place, and time.  Psychiatric:        Mood and Affect: Mood normal.        Behavior: Behavior normal.        Thought Content: Thought content normal.  Judgment: Judgment normal.      No results found for any visits on 02/17/23.     The ASCVD Risk score (Arnett DK, et al., 2019) failed to calculate for the following reasons:   The 2019 ASCVD risk score is only valid for ages 67 to 14    Assessment & Plan:   Problem List Items Addressed This Visit       Active Problems   Coronary artery disease involving native coronary artery of native heart without angina pectoris (Chronic)    Stable. Asymptomatic.       Essential hypertension - Primary (Chronic)    Blood pressure is at goal for age and co-morbidities.   Recommendations: continue current meds - BP goal <130/80 - monitor and log blood pressures at home - check around the same time each day in a relaxed setting - Limit salt to <2000 mg/day - Follow DASH eating plan (heart healthy diet) - limit alcohol to 2 standard drinks per day for men and 1 per day for women - avoid tobacco products - get at least 2 hours of  regular aerobic exercise weekly Patient aware of signs/symptoms requiring further/urgent evaluation. CMP updated today. Following with cardiology        Relevant Orders   CBC with Differential/Platelet   Comprehensive metabolic panel   Lipid panel   TSH   Pure hypercholesterolemia (Chronic)    -Reviewed most recent lipid panel; recheck today  -Medication management: continue atorvastatin 40 mg daily -Diet low in saturated fat -Regular exercise - at least 30 minutes, 5 times per week       Relevant Orders   Comprehensive metabolic panel   Lipid panel   Anxiety (Chronic)    Doing well with rare clorazepate use. Discussed safety.  No SI/HI      Vitamin D deficiency    -Continue Vitamin D supplementation. Labs today.       Relevant Orders   VITAMIN D 25 Hydroxy (Vit-D Deficiency, Fractures)    Return for routine follow-up 6-12 months, continue specialist follow-up.    Clayborne Dana, NP

## 2023-02-17 NOTE — Assessment & Plan Note (Signed)
Blood pressure is at goal for age and co-morbidities.   Recommendations: continue current meds - BP goal <130/80 - monitor and log blood pressures at home - check around the same time each day in a relaxed setting - Limit salt to <2000 mg/day - Follow DASH eating plan (heart healthy diet) - limit alcohol to 2 standard drinks per day for men and 1 per day for women - avoid tobacco products - get at least 2 hours of regular aerobic exercise weekly Patient aware of signs/symptoms requiring further/urgent evaluation. CMP updated today. Following with cardiology

## 2023-02-17 NOTE — Assessment & Plan Note (Signed)
Doing well with rare clorazepate use. Discussed safety.  No SI/HI

## 2023-02-17 NOTE — Assessment & Plan Note (Signed)
Stable. Asymptomatic.

## 2023-02-17 NOTE — Assessment & Plan Note (Signed)
-  Continue Vitamin D supplementation. Labs today.

## 2023-02-17 NOTE — Assessment & Plan Note (Signed)
-  Reviewed most recent lipid panel; recheck today  -Medication management: continue atorvastatin 40 mg daily -Diet low in saturated fat -Regular exercise - at least 30 minutes, 5 times per week

## 2023-02-18 LAB — COMPREHENSIVE METABOLIC PANEL
ALT: 20 U/L (ref 0–35)
AST: 24 U/L (ref 0–37)
Albumin: 4.2 g/dL (ref 3.5–5.2)
Alkaline Phosphatase: 83 U/L (ref 39–117)
BUN: 21 mg/dL (ref 6–23)
CO2: 29 meq/L (ref 19–32)
Calcium: 9.3 mg/dL (ref 8.4–10.5)
Chloride: 97 meq/L (ref 96–112)
Creatinine, Ser: 0.86 mg/dL (ref 0.40–1.20)
GFR: 57.87 mL/min — ABNORMAL LOW (ref >60.00)
Glucose, Bld: 96 mg/dL (ref 70–99)
Potassium: 4.7 meq/L (ref 3.5–5.1)
Sodium: 133 meq/L — ABNORMAL LOW (ref 135–145)
Total Bilirubin: 0.7 mg/dL (ref 0.2–1.2)
Total Protein: 6.5 g/dL (ref 6.0–8.3)

## 2023-02-18 LAB — LIPID PANEL
Cholesterol: 160 mg/dL (ref 0–200)
HDL: 77.1 mg/dL (ref >39.00)
LDL Cholesterol: 72 mg/dL (ref 0–99)
NonHDL: 82.88
Total CHOL/HDL Ratio: 2
Triglycerides: 55 mg/dL (ref 0.0–149.0)
VLDL: 11 mg/dL (ref 0.0–40.0)

## 2023-02-18 LAB — CBC WITH DIFFERENTIAL/PLATELET
Basophils Absolute: 0.1 10*3/uL (ref 0.0–0.1)
Basophils Relative: 1.2 % (ref 0.0–3.0)
Eosinophils Absolute: 0.1 10*3/uL (ref 0.0–0.7)
Eosinophils Relative: 1.3 % (ref 0.0–5.0)
HCT: 37.3 % (ref 36.0–46.0)
Hemoglobin: 12.7 g/dL (ref 12.0–15.0)
Lymphocytes Relative: 20.5 % (ref 12.0–46.0)
Lymphs Abs: 1.8 10*3/uL (ref 0.7–4.0)
MCHC: 34 g/dL (ref 30.0–36.0)
MCV: 91.5 fL (ref 78.0–100.0)
Monocytes Absolute: 0.6 10*3/uL (ref 0.1–1.0)
Monocytes Relative: 6.9 % (ref 3.0–12.0)
Neutro Abs: 6.3 10*3/uL (ref 1.4–7.7)
Neutrophils Relative %: 70.1 % (ref 43.0–77.0)
Platelets: 271 10*3/uL (ref 150.0–400.0)
RBC: 4.08 Mil/uL (ref 3.87–5.11)
RDW: 15.6 % — ABNORMAL HIGH (ref 11.5–15.5)
WBC: 9 10*3/uL (ref 4.0–10.5)

## 2023-02-18 LAB — VITAMIN D 25 HYDROXY (VIT D DEFICIENCY, FRACTURES): VITD: 34.9 ng/mL (ref 30.00–100.00)

## 2023-02-18 LAB — TSH: TSH: 3.43 u[IU]/mL (ref 0.35–5.50)

## 2023-02-18 NOTE — Addendum Note (Signed)
Addended by: Hyman Hopes B on: 02/18/2023 04:01 PM   Modules accepted: Orders

## 2023-03-03 ENCOUNTER — Other Ambulatory Visit (INDEPENDENT_AMBULATORY_CARE_PROVIDER_SITE_OTHER): Payer: Medicare HMO

## 2023-03-03 DIAGNOSIS — E871 Hypo-osmolality and hyponatremia: Secondary | ICD-10-CM

## 2023-03-04 LAB — BASIC METABOLIC PANEL
BUN: 22 mg/dL (ref 6–23)
CO2: 29 meq/L (ref 19–32)
Calcium: 9 mg/dL (ref 8.4–10.5)
Chloride: 99 meq/L (ref 96–112)
Creatinine, Ser: 0.94 mg/dL (ref 0.40–1.20)
GFR: 52 mL/min — ABNORMAL LOW (ref >60.00)
Glucose, Bld: 90 mg/dL (ref 70–99)
Potassium: 4.9 meq/L (ref 3.5–5.1)
Sodium: 135 meq/L (ref 135–145)

## 2023-03-14 ENCOUNTER — Ambulatory Visit (INDEPENDENT_AMBULATORY_CARE_PROVIDER_SITE_OTHER): Payer: Medicare HMO | Admitting: Podiatry

## 2023-03-14 ENCOUNTER — Encounter: Payer: Self-pay | Admitting: Podiatry

## 2023-03-14 VITALS — Ht 64.0 in | Wt 157.0 lb

## 2023-03-14 DIAGNOSIS — M79675 Pain in left toe(s): Secondary | ICD-10-CM

## 2023-03-14 DIAGNOSIS — B351 Tinea unguium: Secondary | ICD-10-CM

## 2023-03-14 DIAGNOSIS — I739 Peripheral vascular disease, unspecified: Secondary | ICD-10-CM | POA: Diagnosis not present

## 2023-03-14 DIAGNOSIS — M79674 Pain in right toe(s): Secondary | ICD-10-CM

## 2023-03-14 DIAGNOSIS — L84 Corns and callosities: Secondary | ICD-10-CM

## 2023-03-14 NOTE — Progress Notes (Signed)
       Subjective:  Patient ID: [name], female    DOB: 04/23/1926,  MRN: [id number]  [name] presents to clinic today for:  Chief Complaint  Patient presents with   Nail Problem    Pt is here for [facility]    Patient notes nails are thick and elongated, causing pain in shoe gear when ambulating.  She is having pain on the fifth toe of the right foot.  PCP is Clayborne Dana, NP.  Date last seen was 02/17/2023  Past Medical History:  Diagnosis Date   Aortic valve sclerosis    CAD (coronary artery disease)    S/p DES to the proximal RCA, POBA to the distal RCA 8/08 //Myoview 2009 low risk // Myoview 9/22: EF 57, no ischemia or infarction, apical thinning artifact, low risk study   DUB (dysfunctional uterine bleeding)    Elevated cholesterol    Atorvastatin   GERD (gastroesophageal reflux disease)    History of echocardiogram    EF 60-65, GRII DD, GLS -18.4, normal RVSF, moderate MAC, moderate AV calcification, trivial AI   Hypertension    Plantar fasciitis    Shingles     No Known Allergies  Objective:  [name] is a pleasant 89 y.o. female in NAD. AAO x 3.  Vascular Examination: Patient has palpable DP pulse, absent PT pulse bilateral.  Delayed capillary refill bilateral toes.  Sparse digital hair bilateral.  Proximal to distal cooling WNL bilateral.    Dermatological Examination: Interspaces are clear with no open lesions noted bilateral.  Skin is shiny and atrophic bilateral.  Nails are 3-60mm thick, with yellowish/brown discoloration, subungual debris and distal onycholysis x10.  There is pain with compression of nails x10.  There are hyperkeratotic lesions noted on medial aspect fifth toe DIPJ and lateral aspect fourth toe PIPJ on the right foot.  Musculoskeletal Examination: Adductovarus deformity right fifth toe  Patient qualifies for at-risk foot care because of PVD.  Assessment/Plan: 1. Corn of toe   2. Pain due to onychomycosis of toenails of both  feet   3. PVD (peripheral vascular disease) (HCC)    Mycotic nails x10 were sharply debrided with sterile nail nippers and power debriding burr to decrease bulk and length.  Hyperkeratotic lesion and right fourth interspace was shaved with #312 blade.  Just proximal to the DIPJ.  She was given a Silipos toe cap, and a rigid gel adhesive to to wear on the fourth toe to push the fifth toe away.  She can try either 1 and see if this is easier to get on and off than the double loop toe spacer that was dispensed last visit.  Also informed patient that she can purchase lambswool from [employer] or [employer] to place between the fourth and fifth toes which may be easier to use than any device that needs to be applied over the toe.   Return in about 3 months (around 06/12/2023) for RFC.   Clerance Lav, DPM, FACFAS [facility]     [address] [place], [place] [address]                Office [phone]  Fax [phone]

## 2023-03-29 ENCOUNTER — Ambulatory Visit: Payer: Medicare HMO | Admitting: Cardiology

## 2023-05-02 ENCOUNTER — Other Ambulatory Visit: Payer: Self-pay | Admitting: Family Medicine

## 2023-05-02 DIAGNOSIS — I10 Essential (primary) hypertension: Secondary | ICD-10-CM

## 2023-05-02 DIAGNOSIS — E7800 Pure hypercholesterolemia, unspecified: Secondary | ICD-10-CM

## 2023-05-24 ENCOUNTER — Encounter: Payer: Self-pay | Admitting: Orthopaedic Surgery

## 2023-06-01 ENCOUNTER — Encounter: Payer: Self-pay | Admitting: Physician Assistant

## 2023-06-01 ENCOUNTER — Other Ambulatory Visit (INDEPENDENT_AMBULATORY_CARE_PROVIDER_SITE_OTHER)

## 2023-06-01 ENCOUNTER — Ambulatory Visit: Admitting: Physician Assistant

## 2023-06-01 DIAGNOSIS — M2022 Hallux rigidus, left foot: Secondary | ICD-10-CM | POA: Diagnosis not present

## 2023-06-01 DIAGNOSIS — M79672 Pain in left foot: Secondary | ICD-10-CM | POA: Diagnosis not present

## 2023-06-01 NOTE — Progress Notes (Signed)
 Office Visit Note   Patient: [name]           Date of Birth: 19-Nov-1926           MRN: [id number] Visit Date: 06/01/2023              Requested by: Clayborne Dana, NP [address] [place],  [place] [address] PCP: Clayborne Dana, NP   Assessment & Plan: Visit Diagnoses:  1. Pain in left foot   2. Hallux rigidus, left foot     Plan: Offered her an injection at the left MP joint she was agreeable.  She tolerated this well.  Discussed with her shoe wear shoes she wears stiffer bottom shoe.  She will follow-up with [place] in 2 weeks see what type of response she had to the injection.  At that time we can address her knee pain.  Follow-Up Instructions: Return in about 2 weeks (around 06/15/2023).   Orders:  Orders Placed This Encounter  Procedures   XR Foot Complete Left   No orders of the defined types were placed in this encounter.     Procedures: No procedures performed   Clinical Data: No additional findings.   Subjective: Chief Complaint  Patient presents with   Left Foot - Pain    HPI [name] 89 year old female comes in today with left foot pain for the last 2 weeks no known injury.  She states pain is on the buttock dorsum of her foot.  She also notes she has foot and ankle swelling.  States the pain is worse whenever she first wakes up and begins to ambulate.  She has tried no medications for this.  She is also having bilateral knee pain. Review of Systems Negative for fevers chills ongoing infection or diabetes  Objective: Vital Signs: LMP  (LMP Unknown)   Physical Exam General: Well-developed well-nourished female no acute distress Respirations: Unlabored Vascular dorsal pedal pulses are 2+ and equal symmetric.  Lower extremity edema bilateral with slight pitting edema.  Calves are supple and nontender bilaterally.  Good range of motion bilateral ankles.  Ortho Exam Bilateral feet: No rashes skin lesions ulcerations.  Right great  toenail onychomycosis like changes.  Sensation grossly intact bilateral feet light touch.  No abnormal warmth erythema of either foot.  Pain with range of motion left great toe through the MP joint.  Minor dorsal aspect the foot is nontender.  Nontender over the under the metatarsal heads.  5 out of 5 strength with inversion eversion against resistance bilaterally.  Slight tenderness just posterior to the lateral malleoli bilaterally.  Specialty Comments:  No specialty comments available.  Imaging: XR Foot Complete Left Result Date: 06/01/2023 Left foot: No acute fractures.  Hallux moderate.  Cavus type foot.  No subluxations dislocations.  No acute findings    PMFS History: Patient Active Problem List   Diagnosis Date Noted   Vitamin D deficiency 02/17/2023   High risk medication use 03/25/2022   Bilateral sensorineural hearing loss 12/29/2021   Anxiety 11/24/2021   Aortic valve sclerosis 02/24/2021   Varicose veins of both lower extremities 02/24/2021   Left knee pain 04/28/2015   Coronary artery disease involving native coronary artery of native heart without angina pectoris 11/22/2013   Essential hypertension 11/22/2013   Pure hypercholesterolemia 11/22/2013   Inversion, nipple 09/05/2013   Shingles    Elevated cholesterol    Cancer (HCC)    Right knee pain 05/10/2011  Past Medical History:  Diagnosis Date   Aortic valve sclerosis    CAD (coronary artery disease)    S/p DES to the proximal RCA, POBA to the distal RCA 8/08 //Myoview 2009 low risk // Myoview 9/22: EF 57, no ischemia or infarction, apical thinning artifact, low risk study   DUB (dysfunctional uterine bleeding)    Elevated cholesterol    Atorvastatin   GERD (gastroesophageal reflux disease)    History of echocardiogram    EF 60-65, GRII DD, GLS -18.4, normal RVSF, moderate MAC, moderate AV calcification, trivial AI   Hypertension    Plantar fasciitis    Shingles     Family History  Problem Relation Age  of Onset   Heart attack Mother    Hypertension Mother    Heart attack Father    CAD Father    Heart disease Brother    Hypertension Brother    Diabetes Brother    COPD Brother    Asthma Brother    Breast cancer Neg Hx     Past Surgical History:  Procedure Laterality Date   APPENDECTOMY  2011   CARDIAC STINT IMPLANT  2008   CARPAL TUNNEL RELEASE     EYE SURGERY     INGUINAL HERNIA REPAIR     OOPHORECTOMY  2001   LAVH,BSO   ULNA NERVE RELEASE  2009   VAGINAL HYSTERECTOMY  2001   LAVH, BSO   Social History   Occupational History   Not on file  Tobacco Use   Smoking status: Former   Smokeless tobacco: Never  Vaping Use   Vaping status: Never Used  Substance and Sexual Activity   Alcohol use: No    Alcohol/week: 0.0 standard drinks of alcohol   Drug use: No   Sexual activity: Never    Birth control/protection: Post-menopausal, Surgical    Comment: INTERCOURSE AGE 24, SEXUAL PARTNERS LEWSS THAN 5

## 2023-06-15 ENCOUNTER — Ambulatory Visit (INDEPENDENT_AMBULATORY_CARE_PROVIDER_SITE_OTHER): Admitting: Physician Assistant

## 2023-06-15 ENCOUNTER — Encounter: Payer: Self-pay | Admitting: Physician Assistant

## 2023-06-15 DIAGNOSIS — M170 Bilateral primary osteoarthritis of knee: Secondary | ICD-10-CM

## 2023-06-15 DIAGNOSIS — M1712 Unilateral primary osteoarthritis, left knee: Secondary | ICD-10-CM

## 2023-06-15 DIAGNOSIS — M1711 Unilateral primary osteoarthritis, right knee: Secondary | ICD-10-CM

## 2023-06-15 MED ORDER — LIDOCAINE HCL 1 % IJ SOLN
3.0000 mL | INTRAMUSCULAR | Status: AC | PRN
  Administered 2023-06-15: 3 mL

## 2023-06-15 MED ORDER — METHYLPREDNISOLONE ACETATE 40 MG/ML IJ SUSP
40.0000 mg | INTRAMUSCULAR | Status: AC | PRN
  Administered 2023-06-15: 40 mg via INTRA_ARTICULAR

## 2023-06-15 NOTE — Progress Notes (Signed)
   Procedure Note  Patient: [name]             Date of Birth: 1926/10/16           MRN: [id number]             Visit Date: 06/15/2023 HPI Mrs. Hoeppner comes in today requesting steroid injections both knees.  She has known arthritis of both knees.  She does state that the cortisone injection left MP joint on 06/01/2023 was helpful.  She denies any fevers chills and injuries to either knee.  Prior injections in October 24 both knees were beneficial.  Review of systems: See HPI otherwise negative  Physical exam: General Well-developed well-nourished female no acute distress ambulates with cane left hand.  Bilateral knees: Good range of motion of both knees.  Patellofemoral crepitus both knees no abnormal warmth erythema or effusion. Procedures: Visit Diagnoses: No diagnosis found.  Large Joint Inj: bilateral knee on 06/15/2023 11:46 AM Indications: pain Details: 22 G 1.5 in needle, anterolateral approach  Arthrogram: No  Medications (Right): 3 mL lidocaine 1 %; 40 mg methylPREDNISolone acetate 40 MG/ML Medications (Left): 3 mL lidocaine 1 %; 40 mg methylPREDNISolone acetate 40 MG/ML Outcome: tolerated well, no immediate complications Procedure, treatment alternatives, risks and benefits explained, specific risks discussed. Consent was given by the patient. Immediately prior to procedure a time out was called to verify the correct patient, procedure, equipment, support staff and site/side marked as required. Patient was prepped and draped in the usual sterile fashion.      Plan: She knows to wait at least 3 months between cortisone injections.  Questions were encouraged and answered.

## 2023-06-29 ENCOUNTER — Encounter: Payer: Self-pay | Admitting: Family Medicine

## 2023-06-29 ENCOUNTER — Other Ambulatory Visit: Payer: Self-pay | Admitting: Family Medicine

## 2023-06-29 DIAGNOSIS — I10 Essential (primary) hypertension: Secondary | ICD-10-CM

## 2023-06-29 DIAGNOSIS — F419 Anxiety disorder, unspecified: Secondary | ICD-10-CM

## 2023-06-29 MED ORDER — PANTOPRAZOLE SODIUM 40 MG PO TBEC: 40.0000 mg | DELAYED_RELEASE_TABLET | Freq: Every day | ORAL | 1 refills | Status: DC

## 2023-06-29 MED ORDER — CLORAZEPATE DIPOTASSIUM 3.75 MG PO TABS: 3.7500 mg | ORAL_TABLET | Freq: Two times a day (BID) | ORAL | 0 refills | Status: DC | PRN

## 2023-06-29 MED ORDER — AMLODIPINE BESYLATE 5 MG PO TABS: 5.0000 mg | ORAL_TABLET | Freq: Every day | ORAL | 0 refills | Status: DC

## 2023-06-29 NOTE — Telephone Encounter (Signed)
 Fine to switch back

## 2023-06-29 NOTE — Telephone Encounter (Signed)
 Requesting: clorazepate 3.75mg   Contract: 03/25/22 UDS: 03/25/22 Last Visit: 02/17/23 Next Visit: None Last Refill: 02/01/23 #60 and 0RF   Please Advise

## 2023-06-30 ENCOUNTER — Other Ambulatory Visit: Payer: Self-pay | Admitting: Family Medicine

## 2023-06-30 DIAGNOSIS — I10 Essential (primary) hypertension: Secondary | ICD-10-CM

## 2023-06-30 NOTE — Telephone Encounter (Signed)
 Copied from CRM [id number]. Topic: Clinical - Medication Refill >> Jun 30, 2023  3:59 PM [place] J wrote: Most Recent Primary Care Visit:  Provider: [facility]  Department: [facility]  Visit Type: LAB  Date: 03/03/2023  Medication: Lisinopril (40 mg Oral Daily)  Has the patient contacted their pharmacy? Yes (Agent: If no, request that the patient contact the pharmacy for the refill. If patient does not wish to contact the pharmacy document the reason why and proceed with request.) (Agent: If yes, when and what did the pharmacy advise?)  Is this the correct pharmacy for this prescription? Yes If no, delete pharmacy and type the correct one.  This is the patient's preferred pharmacy:  [facility] - [place], [place] - [address] [place] [address] Phone: [phone] Fax: [phone]   Has the prescription been filled recently? No  Is the patient out of the medication? Yes  Has the patient been seen for an appointment in the last year OR does the patient have an upcoming appointment? Yes  Can we respond through MyChart? Yes  Agent: Please be advised that Rx refills may take up to 3 business days. We ask that you follow-up with your pharmacy.

## 2023-07-01 NOTE — Addendum Note (Signed)
 Addended by: Benn Brash on: 07/01/2023 07:43 AM   Modules accepted: Orders

## 2023-07-12 ENCOUNTER — Ambulatory Visit: Payer: Medicare HMO

## 2023-07-28 ENCOUNTER — Encounter: Payer: Self-pay | Admitting: Family Medicine

## 2023-07-28 ENCOUNTER — Ambulatory Visit: Admitting: Family Medicine

## 2023-07-28 ENCOUNTER — Other Ambulatory Visit (HOSPITAL_BASED_OUTPATIENT_CLINIC_OR_DEPARTMENT_OTHER): Payer: Self-pay

## 2023-07-28 VITALS — BP 140/65 | HR 64 | Ht 64.0 in | Wt 156.0 lb

## 2023-07-28 DIAGNOSIS — I10 Essential (primary) hypertension: Secondary | ICD-10-CM

## 2023-07-28 DIAGNOSIS — N1831 Chronic kidney disease, stage 3a: Secondary | ICD-10-CM

## 2023-07-28 DIAGNOSIS — E7800 Pure hypercholesterolemia, unspecified: Secondary | ICD-10-CM | POA: Diagnosis not present

## 2023-07-28 DIAGNOSIS — Z0000 Encounter for general adult medical examination without abnormal findings: Secondary | ICD-10-CM

## 2023-07-28 DIAGNOSIS — E559 Vitamin D deficiency, unspecified: Secondary | ICD-10-CM | POA: Diagnosis not present

## 2023-07-28 DIAGNOSIS — F419 Anxiety disorder, unspecified: Secondary | ICD-10-CM

## 2023-07-28 MED ORDER — ZOSTER VAC RECOMB ADJUVANTED 50 MCG/0.5ML IM SUSR
0.5000 mL | Freq: Once | INTRAMUSCULAR | 0 refills | Status: AC
  Filled 2023-07-28: qty 0.5, 1d supply, fill #0

## 2023-07-28 MED ORDER — AMLODIPINE BESYLATE 5 MG PO TABS: 5.0000 mg | ORAL_TABLET | Freq: Every day | ORAL | 1 refills | Status: DC

## 2023-07-28 MED ORDER — ATORVASTATIN CALCIUM 40 MG PO TABS: 40.0000 mg | ORAL_TABLET | Freq: Every day | ORAL | 1 refills | Status: DC

## 2023-07-28 MED ORDER — CLORAZEPATE DIPOTASSIUM 3.75 MG PO TABS: 3.7500 mg | ORAL_TABLET | Freq: Two times a day (BID) | ORAL | 0 refills | Status: DC | PRN

## 2023-07-28 MED ORDER — LISINOPRIL 40 MG PO TABS: 40.0000 mg | ORAL_TABLET | Freq: Every day | ORAL | 1 refills | Status: DC

## 2023-07-28 NOTE — Progress Notes (Signed)
 Established Patient Office Visit  Subjective   Patient ID: [name], female    DOB: [birth year]  Age: 89 y.o. MRN: [id number]  Chief Complaint  Patient presents with   Annual Exam    HPI  Patient here for routine follow-up. Reports she is doing well overall. No acute concerns.     Mood follow-up: - Diagnosis: anxiety  - Treatment: clorazepate  3.75 mg BID PRN, she has been taking rarely - Medication side effects: none - SI/HI: none - Update: Stable. No new concerns.    Hypertension/CAD: - Medications: amlodipine  5 mg daily, lisinopril  40 mg daily, aspirin 81 mg daily - Compliance: good - Checking BP at home: good - Denies any SOB, recurrent headaches, CP, vision changes, LE edema, dizziness, palpitations, or medication side effects.   Hyperlipidemia: - medications: atorvastatin  40 mg daily - compliance: good - medication SEs: none The ASCVD Risk score (Arnett DK, et al., 2019) failed to calculate for the following reasons:   The 2019 ASCVD risk score is only valid for ages 31 to 70   Vitamin D  deficiency: - Treatment: vitamin D  2000 units daily - No new concerns      07/28/2023    1:53 PM 02/17/2023    2:16 PM 07/01/2022    3:14 PM  PHQ9 SCORE ONLY  PHQ-9 Total Score 0 1 0      07/28/2023    1:53 PM 02/17/2023    2:17 PM  GAD 7 : Generalized Anxiety Score  Nervous, Anxious, on Edge 0 0  Control/stop worrying 0 0  Worry too much - different things 0 0  Trouble relaxing 0 1  Restless 0 0  Easily annoyed or irritable 0 0  Afraid - awful might happen 0 0  Total GAD 7 Score 0 1  Anxiety Difficulty Not difficult at all Not difficult at all        ROS All review of systems negative except what is listed in the HPI     Objective:     BP (!) 140/65   Pulse 64   Ht 5\' 4"  (1.626 m)   Wt 156 lb (70.8 kg)   LMP  (LMP Unknown)   SpO2 99%   BMI 26.78 kg/m    Physical Exam Vitals reviewed.  Constitutional:      General: She is not in  acute distress.    Appearance: Normal appearance. She is not ill-appearing.  HENT:     Head: Normocephalic and atraumatic.  Cardiovascular:     Rate and Rhythm: Normal rate and regular rhythm.     Pulses: Normal pulses.     Heart sounds: Murmur heard.  Pulmonary:     Effort: Pulmonary effort is normal.     Breath sounds: Normal breath sounds.  Musculoskeletal:     Cervical back: Normal range of motion and neck supple.     Right lower leg: No edema.     Left lower leg: No edema.  Skin:    General: Skin is warm and dry.  Neurological:     Mental Status: She is alert and oriented to person, place, and time.  Psychiatric:        Mood and Affect: Mood normal.        Behavior: Behavior normal.        Thought Content: Thought content normal.        Judgment: Judgment normal.      No results found for any visits on 07/28/23.  The ASCVD Risk score (Arnett DK, et al., 2019) failed to calculate for the following reasons:   The 2019 ASCVD risk score is only valid for ages 31 to 18    Assessment & Plan:   Problem List Items Addressed This Visit       Active Problems   Essential hypertension - Primary (Chronic)   Blood pressure is at goal for age and co-morbidities.   Recommendations: continue current meds - BP goal <130/80 - monitor and log blood pressures at home - check around the same time each day in a relaxed setting - Limit salt to <2000 mg/day - Follow DASH eating plan (heart healthy diet) - limit alcohol  to 2 standard drinks per day for men and 1 per day for women - avoid tobacco products - get at least 2 hours of regular aerobic exercise weekly Patient aware of signs/symptoms requiring further/urgent evaluation. BMP updated today. Following with cardiology        Relevant Medications   lisinopril  (ZESTRIL ) 40 MG tablet   amLODipine  (NORVASC ) 5 MG tablet   atorvastatin  (LIPITOR) 40 MG tablet   Other Relevant Orders   Basic Metabolic Panel (BMET)   Pure  hypercholesterolemia (Chronic)   -Reviewed most recent lipid panel; recheck today  -Medication management: continue atorvastatin  40 mg daily -Diet low in saturated fat -Regular exercise - at least 30 minutes, 5 times per week       Relevant Medications   lisinopril  (ZESTRIL ) 40 MG tablet   amLODipine  (NORVASC ) 5 MG tablet   atorvastatin  (LIPITOR) 40 MG tablet   Anxiety (Chronic)   Doing well with rare clorazepate  use. Discussed safety.  No SI/HI PDMP reviewed. Refill provided.       Relevant Medications   clorazepate  (TRANXENE ) 3.75 MG tablet   Stage 3a chronic kidney disease (HCC) (Chronic)   Hydrate and monitor       Vitamin D  deficiency   -Continue Vitamin D  supplementation.           Return in about 3 months (around 10/28/2023) for routine follow-up.    Everlina Hock, NP

## 2023-07-28 NOTE — Assessment & Plan Note (Signed)
 Blood pressure is at goal for age and co-morbidities.   Recommendations: continue current meds - BP goal <130/80 - monitor and log blood pressures at home - check around the same time each day in a relaxed setting - Limit salt to <2000 mg/day - Follow DASH eating plan (heart healthy diet) - limit alcohol  to 2 standard drinks per day for men and 1 per day for women - avoid tobacco products - get at least 2 hours of regular aerobic exercise weekly Patient aware of signs/symptoms requiring further/urgent evaluation. BMP updated today. Following with cardiology

## 2023-07-28 NOTE — Assessment & Plan Note (Signed)
-  Reviewed most recent lipid panel; recheck today  -Medication management: continue atorvastatin 40 mg daily -Diet low in saturated fat -Regular exercise - at least 30 minutes, 5 times per week

## 2023-07-28 NOTE — Assessment & Plan Note (Signed)
 Continue Vitamin D supplementation

## 2023-07-28 NOTE — Assessment & Plan Note (Signed)
 Hydrate and monitor

## 2023-07-28 NOTE — Assessment & Plan Note (Signed)
 Doing well with rare clorazepate  use. Discussed safety.  No SI/HI PDMP reviewed. Refill provided.

## 2023-07-29 LAB — BASIC METABOLIC PANEL WITH GFR
BUN: 21 mg/dL (ref 6–23)
CO2: 27 meq/L (ref 19–32)
Calcium: 9.4 mg/dL (ref 8.4–10.5)
Chloride: 97 meq/L (ref 96–112)
Creatinine, Ser: 0.93 mg/dL (ref 0.40–1.20)
GFR: 52.52 mL/min — ABNORMAL LOW (ref >60.00)
Glucose, Bld: 92 mg/dL (ref 70–99)
Potassium: 5.2 meq/L — ABNORMAL HIGH (ref 3.5–5.1)
Sodium: 135 meq/L (ref 135–145)

## 2023-08-09 ENCOUNTER — Ambulatory Visit: Payer: Self-pay | Admitting: Family Medicine

## 2023-08-09 DIAGNOSIS — E875 Hyperkalemia: Secondary | ICD-10-CM

## 2023-08-24 ENCOUNTER — Other Ambulatory Visit (INDEPENDENT_AMBULATORY_CARE_PROVIDER_SITE_OTHER)

## 2023-08-24 DIAGNOSIS — E875 Hyperkalemia: Secondary | ICD-10-CM | POA: Diagnosis not present

## 2023-08-24 LAB — POTASSIUM: Potassium: 5.2 mmol/L (ref 3.5–5.3)

## 2023-08-25 ENCOUNTER — Ambulatory Visit: Payer: Self-pay | Admitting: Family Medicine

## 2023-09-29 ENCOUNTER — Ambulatory Visit: Admitting: Physician Assistant

## 2023-09-29 DIAGNOSIS — M79671 Pain in right foot: Secondary | ICD-10-CM

## 2023-09-30 NOTE — Progress Notes (Signed)
 SABRA

## 2023-10-03 ENCOUNTER — Ambulatory Visit: Admitting: Physician Assistant

## 2023-10-06 ENCOUNTER — Encounter: Payer: Self-pay | Admitting: Physician Assistant

## 2023-10-06 ENCOUNTER — Ambulatory Visit (INDEPENDENT_AMBULATORY_CARE_PROVIDER_SITE_OTHER): Admitting: Physician Assistant

## 2023-10-06 ENCOUNTER — Other Ambulatory Visit (INDEPENDENT_AMBULATORY_CARE_PROVIDER_SITE_OTHER): Payer: Self-pay

## 2023-10-06 DIAGNOSIS — M25572 Pain in left ankle and joints of left foot: Secondary | ICD-10-CM

## 2023-10-06 NOTE — Progress Notes (Signed)
 Office Visit Note   Patient: [name]           Date of Birth: 1926-04-23           MRN: [id number] Visit Date: 10/06/2023              Requested by: Almarie Waddell NOVAK, NP [address] [place],  [place] [address] PCP: Almarie Waddell NOVAK, NP   Assessment & Plan: Visit Diagnoses:  1. Pain in left ankle and joints of left foot     Plan: Impression is left foot pain with recent x-ray findings suggestive of a proximal third metatarsal fracture.  We have discussed placing patient in a postoperative shoe weightbearing as tolerated.  She will ice and elevate as needed.  Follow-up in 3 weeks for repeat evaluation and x-rays of the left foot.  Follow-Up Instructions: Return in about 3 weeks (around 10/27/2023).   Orders:  Orders Placed This Encounter  Procedures   XR Foot Complete Left   No orders of the defined types were placed in this encounter.     Procedures: No procedures performed   Clinical Data: No additional findings.   Subjective: Chief Complaint  Patient presents with   Left Foot - Pain    HPI patient is a very pleasant 89 year old female who comes in today with left foot pain.  This began about 2 weeks ago.  She denies any injury or change in activity but notes that this occurred when she stood up to get out of her bed 1 morning.  Majority of her pain is along the lateral foot.  Symptoms primarily occur when she is ambulating.  She has not take anything for the pain.  She does have a history of arthritis to the first MP joint and has had this injected by [facility] in the past.  Review of Systems as detailed in HPI.  All others reviewed and are negative.   Objective: Vital Signs: LMP  (LMP Unknown)   Physical Exam well-developed well-nourished female in no acute distress.  Alert and oriented x 3.  Ortho Exam left foot exam: She does have moderate tenderness along the posterior tibial tendon as well as tenderness into the proximal aspects of the  3rd, 4th and 5th metatarsals.  Painless range of motion of the ankle.  She does have moderate swelling to the dorsum of the foot.  She is neurovascularly intact distally.  Specialty Comments:  No specialty comments available.  Imaging: XR Foot Complete Left Result Date: 10/06/2023 X-rays demonstrate a proximal third metatarsal fracture.  Also noted is what appears to be a healed proximal second metatarsal fracture.    PMFS History: Patient Active Problem List   Diagnosis Date Noted   Vitamin D  deficiency 02/17/2023   High risk medication use 03/25/2022   Bilateral sensorineural hearing loss 12/29/2021   Anxiety 11/24/2021   Aortic valve sclerosis 02/24/2021   Varicose veins of both lower extremities 02/24/2021   Left knee pain 04/28/2015   Stage 3a chronic kidney disease (HCC) 12/04/2014   Coronary artery disease involving native coronary artery of native heart without angina pectoris 11/22/2013   Essential hypertension 11/22/2013   Pure hypercholesterolemia 11/22/2013   Inversion, nipple 09/05/2013   Shingles    Elevated cholesterol    Cancer (HCC)    Right knee pain 05/10/2011   Past Medical History:  Diagnosis Date   Aortic valve sclerosis    CAD (coronary artery disease)    S/p DES  to the proximal RCA, POBA to the distal RCA 8/08 //Myoview  2009 low risk // Myoview  9/22: EF 57, no ischemia or infarction, apical thinning artifact, low risk study   DUB (dysfunctional uterine bleeding)    Elevated cholesterol    Atorvastatin    GERD (gastroesophageal reflux disease)    History of echocardiogram    EF 60-65, GRII DD, GLS -18.4, normal RVSF, moderate MAC, moderate AV calcification, trivial AI   Hypertension    Plantar fasciitis    Shingles     Family History  Problem Relation Age of Onset   Heart attack Mother    Hypertension Mother    Heart attack Father    CAD Father    Heart disease Brother    Hypertension Brother    Diabetes Brother    COPD Brother    Asthma  Brother    Breast cancer Neg Hx     Past Surgical History:  Procedure Laterality Date   APPENDECTOMY  2011   CARDIAC STINT IMPLANT  2008   CARPAL TUNNEL RELEASE     EYE SURGERY     INGUINAL HERNIA REPAIR     OOPHORECTOMY  2001   LAVH,BSO   ULNA NERVE RELEASE  2009   VAGINAL HYSTERECTOMY  2001   LAVH, BSO   Social History   Occupational History   Not on file  Tobacco Use   Smoking status: Former   Smokeless tobacco: Never  Vaping Use   Vaping status: Never Used  Substance and Sexual Activity   Alcohol  use: No    Alcohol /week: 0.0 standard drinks of alcohol    Drug use: No   Sexual activity: Never    Birth control/protection: Post-menopausal, Surgical    Comment: INTERCOURSE AGE 50, SEXUAL PARTNERS LEWSS THAN 5

## 2023-10-25 ENCOUNTER — Encounter: Payer: Self-pay | Admitting: Physician Assistant

## 2023-10-25 ENCOUNTER — Other Ambulatory Visit (INDEPENDENT_AMBULATORY_CARE_PROVIDER_SITE_OTHER): Payer: Self-pay

## 2023-10-25 ENCOUNTER — Ambulatory Visit (INDEPENDENT_AMBULATORY_CARE_PROVIDER_SITE_OTHER): Admitting: Physician Assistant

## 2023-10-25 ENCOUNTER — Other Ambulatory Visit: Payer: Self-pay | Admitting: Physician Assistant

## 2023-10-25 DIAGNOSIS — M25572 Pain in left ankle and joints of left foot: Secondary | ICD-10-CM | POA: Diagnosis not present

## 2023-10-25 DIAGNOSIS — M170 Bilateral primary osteoarthritis of knee: Secondary | ICD-10-CM | POA: Diagnosis not present

## 2023-10-25 MED ORDER — LIDOCAINE HCL 1 % IJ SOLN
3.0000 mL | INTRAMUSCULAR | Status: AC | PRN
  Administered 2023-10-25 (×2): 3 mL

## 2023-10-25 MED ORDER — METHYLPREDNISOLONE ACETATE 40 MG/ML IJ SUSP
13.3300 mg | INTRAMUSCULAR | Status: AC | PRN
  Administered 2023-10-25 (×2): 13.33 mg via INTRA_ARTICULAR

## 2023-10-25 MED ORDER — BUPIVACAINE HCL 0.25 % IJ SOLN
0.6600 mL | INTRAMUSCULAR | Status: AC | PRN
  Administered 2023-10-25 (×2): .66 mL via INTRA_ARTICULAR

## 2023-10-25 NOTE — Progress Notes (Signed)
 Office Visit Note   Patient: [name]           Date of Birth: [birth year]           MRN: [id number] Visit Date: 10/25/2023              Requested by: Almarie Waddell NOVAK, NP [address] [place],  [place] [address] PCP: Almarie Waddell NOVAK, NP   Assessment & Plan: Visit Diagnoses:  1. Bilateral primary osteoarthritis of knee   2. Pain in left ankle and joints of left foot     Plan: Impression is 5 weeks status post left foot proximal third metatarsal fracture and bilateral knee osteoarthritis flare.  In regards to her foot, she will continue wearing a stiff soled shoe.  Ice and elevate as needed.  I recommended Voltaren as needed.  Follow-up in 3 weeks for repeat evaluation and x-rays of the left foot.  Call with concerns or questions.  In regards to her knees, we have discussed repeat cortisone injections today.  Follow-up as needed for her knees.  Follow-Up Instructions: Return in about 3 weeks (around 11/15/2023).   Orders:  Orders Placed This Encounter  Procedures   Large Joint Inj: bilateral knee   XR Foot 2 Views Left   No orders of the defined types were placed in this encounter.     Procedures: Large Joint Inj: bilateral knee on 10/25/2023 1:19 PM Indications: pain Details: 22 G needle, anterolateral approach Medications (Right): 0.66 mL bupivacaine 0.25 %; 3 mL lidocaine 1 %; 13.33 mg methylPREDNISolone acetate 40 MG/ML Medications (Left): 0.66 mL bupivacaine 0.25 %; 3 mL lidocaine 1 %; 13.33 mg methylPREDNISolone acetate 40 MG/ML      Clinical Data: No additional findings.   Subjective: Chief Complaint  Patient presents with   Right Knee - Pain   Left Knee - Pain    HPI patient is a pleasant 89 year old female who comes in today with bilateral knee pain and for follow-up of her left foot.  She has a history of bilateral knee osteoarthritis.  She is undergone cortisone injections in the past with the last ones being in early April of this  year.  She did have good relief for about 3 months.  Symptoms have returned.  Her pain primarily occurs with activity such as walking.  She has been using Aspercreme without significant relief.  She would like repeat cortisone injections today.  Another issue she is here for it is her left foot.  She is approximately 5 weeks status post proximal third metatarsal fracture.  She has been doing much better although she still has some discomfort with ambulating.  Review of Systems as detailed in HPI.  All others reviewed and are negative.   Objective: Vital Signs: LMP  (LMP Unknown)   Physical Exam well-developed well-nourished female in no acute distress.  Alert and oriented x 3.  Ortho Exam bilateral knee exam: Range of motion 0 to 110 degrees.  Medial joint line tenderness.  She is neurovascular intact distally.  Specialty Comments:  No specialty comments available.  Imaging: XR Foot 2 Views Left Result Date: 10/25/2023 X-rays demonstrate callus formation of the fracture site    PMFS History: Patient Active Problem List   Diagnosis Date Noted   Vitamin D deficiency 02/17/2023   High risk medication use 03/25/2022   Bilateral sensorineural hearing loss 12/29/2021   Anxiety 11/24/2021   Aortic valve sclerosis 02/24/2021   Varicose veins of both  lower extremities 02/24/2021   Left knee pain 04/28/2015   Stage 3a chronic kidney disease (HCC) 12/04/2014   Coronary artery disease involving native coronary artery of native heart without angina pectoris 11/22/2013   Essential hypertension 11/22/2013   Pure hypercholesterolemia 11/22/2013   Inversion, nipple 09/05/2013   Shingles    Elevated cholesterol    Cancer (HCC)    Right knee pain 05/10/2011   Past Medical History:  Diagnosis Date   Aortic valve sclerosis    CAD (coronary artery disease)    S/p DES to the proximal RCA, POBA to the distal RCA 8/08 //Myoview  2009 low risk // Myoview  9/22: EF 57, no ischemia or infarction,  apical thinning artifact, low risk study   DUB (dysfunctional uterine bleeding)    Elevated cholesterol    Atorvastatin    GERD (gastroesophageal reflux disease)    History of echocardiogram    EF 60-65, GRII DD, GLS -18.4, normal RVSF, moderate MAC, moderate AV calcification, trivial AI   Hypertension    Plantar fasciitis    Shingles     Family History  Problem Relation Age of Onset   Heart attack Mother    Hypertension Mother    Heart attack Father    CAD Father    Heart disease Brother    Hypertension Brother    Diabetes Brother    COPD Brother    Asthma Brother    Breast cancer Neg Hx     Past Surgical History:  Procedure Laterality Date   APPENDECTOMY  2011   CARDIAC STINT IMPLANT  2008   CARPAL TUNNEL RELEASE     EYE SURGERY     INGUINAL HERNIA REPAIR     OOPHORECTOMY  2001   LAVH,BSO   ULNA NERVE RELEASE  2009   VAGINAL HYSTERECTOMY  2001   LAVH, BSO   Social History   Occupational History   Not on file  Tobacco Use   Smoking status: Former   Smokeless tobacco: Never  Vaping Use   Vaping status: Never Used  Substance and Sexual Activity   Alcohol  use: No    Alcohol /week: 0.0 standard drinks of alcohol    Drug use: No   Sexual activity: Never    Birth control/protection: Post-menopausal, Surgical    Comment: INTERCOURSE AGE 33, SEXUAL PARTNERS LEWSS THAN 5

## 2023-10-31 ENCOUNTER — Encounter: Payer: Self-pay | Admitting: Family Medicine

## 2023-10-31 ENCOUNTER — Ambulatory Visit (INDEPENDENT_AMBULATORY_CARE_PROVIDER_SITE_OTHER): Admitting: Family Medicine

## 2023-10-31 VITALS — BP 132/78 | HR 70 | Ht 64.0 in | Wt 155.0 lb

## 2023-10-31 DIAGNOSIS — E7800 Pure hypercholesterolemia, unspecified: Secondary | ICD-10-CM

## 2023-10-31 DIAGNOSIS — I2510 Atherosclerotic heart disease of native coronary artery without angina pectoris: Secondary | ICD-10-CM | POA: Diagnosis not present

## 2023-10-31 DIAGNOSIS — M79672 Pain in left foot: Secondary | ICD-10-CM | POA: Diagnosis not present

## 2023-10-31 DIAGNOSIS — N1831 Chronic kidney disease, stage 3a: Secondary | ICD-10-CM | POA: Diagnosis not present

## 2023-10-31 DIAGNOSIS — E559 Vitamin D deficiency, unspecified: Secondary | ICD-10-CM | POA: Diagnosis not present

## 2023-10-31 DIAGNOSIS — Z79899 Other long term (current) drug therapy: Secondary | ICD-10-CM

## 2023-10-31 DIAGNOSIS — I10 Essential (primary) hypertension: Secondary | ICD-10-CM

## 2023-10-31 DIAGNOSIS — F419 Anxiety disorder, unspecified: Secondary | ICD-10-CM

## 2023-10-31 MED ORDER — DICLOFENAC SODIUM 1 % EX GEL: 2.0000 g | Freq: Four times a day (QID) | CUTANEOUS | 3 refills | Status: AC

## 2023-10-31 NOTE — Progress Notes (Signed)
 Established Patient Office Visit  Subjective   Patient ID: [name], female    DOB: [birth year]  Age: 89 y.o. MRN: [id number]  Chief Complaint  Patient presents with   Medical Management of Chronic Issues    HPI  Patient here for routine follow-up. Reports she is doing well overall. No acute concerns. States she is gradually recovering from a left foot fracture - following with Ortho.   Mood follow-up: - Diagnosis: anxiety  - Treatment: clorazepate  3.75 mg BID PRN, she has been taking rarely - Medication side effects: none - SI/HI: none - Update: Stable. No new concerns.    Hypertension/CAD: - Medications: amlodipine  5 mg daily, lisinopril  40 mg daily, aspirin 81 mg daily - Compliance: good - Checking BP at home: good - Denies any SOB, recurrent headaches, CP, vision changes, LE edema, dizziness, palpitations, or medication side effects.   Hyperlipidemia: - medications: atorvastatin  40 mg daily - compliance: good - medication SEs: none The ASCVD Risk score (Arnett DK, et al., 2019) failed to calculate for the following reasons:   The 2019 ASCVD risk score is only valid for ages 68 to 80   Vitamin D  deficiency: - Treatment: vitamin D  2000 units daily - No new concerns      07/28/2023    1:53 PM 02/17/2023    2:16 PM 07/01/2022    3:14 PM  PHQ9 SCORE ONLY  PHQ-9 Total Score 0 1 0      07/28/2023    1:53 PM 02/17/2023    2:17 PM  GAD 7 : Generalized Anxiety Score  Nervous, Anxious, on Edge 0 0  Control/stop worrying 0 0  Worry too much - different things 0 0  Trouble relaxing 0 1  Restless 0 0  Easily annoyed or irritable 0 0  Afraid - awful might happen 0 0  Total GAD 7 Score 0 1  Anxiety Difficulty Not difficult at all Not difficult at all        ROS All review of systems negative except what is listed in the HPI     Objective:     BP 132/78   Pulse 70   Ht 5' 4 (1.626 m)   Wt 155 lb (70.3 kg)   LMP  (LMP Unknown)   SpO2 97%    BMI 26.61 kg/m    Physical Exam Vitals reviewed.  Constitutional:      General: She is not in acute distress.    Appearance: Normal appearance. She is not ill-appearing.  HENT:     Head: Normocephalic and atraumatic.  Cardiovascular:     Rate and Rhythm: Normal rate and regular rhythm.     Pulses: Normal pulses.     Heart sounds: Murmur heard.  Pulmonary:     Effort: Pulmonary effort is normal.     Breath sounds: Normal breath sounds.  Musculoskeletal:     Cervical back: Normal range of motion and neck supple.     Right lower leg: No edema.     Left lower leg: No edema.  Skin:    General: Skin is warm and dry.  Neurological:     Mental Status: She is alert and oriented to person, place, and time.  Psychiatric:        Mood and Affect: Mood normal.        Behavior: Behavior normal.        Thought Content: Thought content normal.        Judgment: Judgment normal.  No results found for any visits on 10/31/23.     The ASCVD Risk score (Arnett DK, et al., 2019) failed to calculate for the following reasons:   The 2019 ASCVD risk score is only valid for ages 41 to 44    Assessment & Plan:   Problem List Items Addressed This Visit       Active Problems   Coronary artery disease involving native coronary artery of native heart without angina pectoris - Primary (Chronic)   Stable. Asymptomatic.       Relevant Orders   CBC with Differential/Platelet   Comprehensive metabolic panel with GFR   Essential hypertension (Chronic)   Blood pressure is at goal for age and co-morbidities.   Recommendations: continue current meds - BP goal <130/80 - monitor and log blood pressures at home - check around the same time each day in a relaxed setting - Limit salt to <2000 mg/day - Follow DASH eating plan (heart healthy diet) - limit alcohol  to 2 standard drinks per day for men and 1 per day for women - avoid tobacco products - get at least 2 hours of regular aerobic  exercise weekly Patient aware of signs/symptoms requiring further/urgent evaluation. BMP updated today. Following with cardiology        Relevant Orders   CBC with Differential/Platelet   Comprehensive metabolic panel with GFR   TSH   Pure hypercholesterolemia (Chronic)   -Reviewed most recent lipid panel; recheck today  -Medication management: continue atorvastatin  40 mg daily -Diet low in saturated fat -Regular exercise - at least 30 minutes, 5 times per week       Relevant Orders   Lipid panel   Anxiety (Chronic)   Doing well with rare clorazepate  use. Discussed safety.  No SI/HI PDMP reviewed. Refill not needed today.      High risk medication use (Chronic)        Stage 3a chronic kidney disease (HCC) (Chronic)   Hydrate and monitor       Relevant Orders   Comprehensive metabolic panel with GFR   Vitamin D  deficiency   -Continue Vitamin D  supplementation.       Relevant Orders   VITAMIN D  25 Hydroxy (Vit-D Deficiency, Fractures)   Other Visit Diagnoses       Left foot pain     Following with Ortho. Making good progress. Recommend minimal oral NSAIDs to preserve renal function. Okay for occasional tylenol , heat, ice, Voltaren  gel, supportive measures.    Relevant Medications   diclofenac  Sodium (VOLTAREN ) 1 % GEL           Return in about 6 months (around 05/02/2024) for chronic disease management.    Waddell KATHEE Mon, NP

## 2023-10-31 NOTE — Assessment & Plan Note (Signed)
 Continue Vitamin D supplementation

## 2023-10-31 NOTE — Assessment & Plan Note (Signed)
-  Reviewed most recent lipid panel; recheck today  -Medication management: continue atorvastatin 40 mg daily -Diet low in saturated fat -Regular exercise - at least 30 minutes, 5 times per week

## 2023-10-31 NOTE — Assessment & Plan Note (Signed)
 Blood pressure is at goal for age and co-morbidities.   Recommendations: continue current meds - BP goal <130/80 - monitor and log blood pressures at home - check around the same time each day in a relaxed setting - Limit salt to <2000 mg/day - Follow DASH eating plan (heart healthy diet) - limit alcohol  to 2 standard drinks per day for men and 1 per day for women - avoid tobacco products - get at least 2 hours of regular aerobic exercise weekly Patient aware of signs/symptoms requiring further/urgent evaluation. BMP updated today. Following with cardiology

## 2023-10-31 NOTE — Assessment & Plan Note (Signed)
 SABRA

## 2023-10-31 NOTE — Assessment & Plan Note (Signed)
 Stable. Asymptomatic.

## 2023-10-31 NOTE — Assessment & Plan Note (Signed)
 Doing well with rare clorazepate  use. Discussed safety.  No SI/HI PDMP reviewed. Refill not needed today.

## 2023-10-31 NOTE — Assessment & Plan Note (Signed)
 Hydrate and monitor

## 2023-11-01 ENCOUNTER — Ambulatory Visit: Payer: Self-pay | Admitting: Family Medicine

## 2023-11-01 DIAGNOSIS — E871 Hypo-osmolality and hyponatremia: Secondary | ICD-10-CM

## 2023-11-01 LAB — LIPID PANEL
Cholesterol: 169 mg/dL (ref 0–200)
HDL: 74.7 mg/dL (ref >39.00)
LDL Cholesterol: 78 mg/dL (ref 0–99)
NonHDL: 93.83
Total CHOL/HDL Ratio: 2
Triglycerides: 77 mg/dL (ref 0.0–149.0)
VLDL: 15.4 mg/dL (ref 0.0–40.0)

## 2023-11-01 LAB — COMPREHENSIVE METABOLIC PANEL WITH GFR
ALT: 16 U/L (ref 0–35)
AST: 18 U/L (ref 0–37)
Albumin: 4.1 g/dL (ref 3.5–5.2)
Alkaline Phosphatase: 89 U/L (ref 39–117)
BUN: 27 mg/dL — ABNORMAL HIGH (ref 6–23)
CO2: 27 meq/L (ref 19–32)
Calcium: 8.9 mg/dL (ref 8.4–10.5)
Chloride: 96 meq/L (ref 96–112)
Creatinine, Ser: 0.94 mg/dL (ref 0.40–1.20)
GFR: 51.76 mL/min — ABNORMAL LOW (ref >60.00)
Glucose, Bld: 87 mg/dL (ref 70–99)
Potassium: 4.7 meq/L (ref 3.5–5.1)
Sodium: 132 meq/L — ABNORMAL LOW (ref 135–145)
Total Bilirubin: 0.7 mg/dL (ref 0.2–1.2)
Total Protein: 6.4 g/dL (ref 6.0–8.3)

## 2023-11-01 LAB — VITAMIN D 25 HYDROXY (VIT D DEFICIENCY, FRACTURES): VITD: 41.3 ng/mL (ref 30.00–100.00)

## 2023-11-01 LAB — CBC WITH DIFFERENTIAL/PLATELET
Basophils Absolute: 0 K/uL (ref 0.0–0.1)
Basophils Relative: 0.3 % (ref 0.0–3.0)
Eosinophils Absolute: 0.2 K/uL (ref 0.0–0.7)
Eosinophils Relative: 1.8 % (ref 0.0–5.0)
HCT: 35.2 % — ABNORMAL LOW (ref 36.0–46.0)
Hemoglobin: 11.7 g/dL — ABNORMAL LOW (ref 12.0–15.0)
Lymphocytes Relative: 22.5 % (ref 12.0–46.0)
Lymphs Abs: 2 K/uL (ref 0.7–4.0)
MCHC: 33.3 g/dL (ref 30.0–36.0)
MCV: 89.7 fl (ref 78.0–100.0)
Monocytes Absolute: 0.6 K/uL (ref 0.1–1.0)
Monocytes Relative: 6.9 % (ref 3.0–12.0)
Neutro Abs: 5.9 K/uL (ref 1.4–7.7)
Neutrophils Relative %: 68.5 % (ref 43.0–77.0)
Platelets: 275 K/uL (ref 150.0–400.0)
RBC: 3.92 Mil/uL (ref 3.87–5.11)
RDW: 15.3 % (ref 11.5–15.5)
WBC: 8.7 K/uL (ref 4.0–10.5)

## 2023-11-01 LAB — TSH: TSH: 3.34 u[IU]/mL (ref 0.35–5.50)

## 2023-11-11 ENCOUNTER — Other Ambulatory Visit (INDEPENDENT_AMBULATORY_CARE_PROVIDER_SITE_OTHER)

## 2023-11-11 DIAGNOSIS — E871 Hypo-osmolality and hyponatremia: Secondary | ICD-10-CM

## 2023-11-11 LAB — SODIUM: Sodium: 136 meq/L (ref 135–145)

## 2023-11-15 ENCOUNTER — Ambulatory Visit (INDEPENDENT_AMBULATORY_CARE_PROVIDER_SITE_OTHER): Admitting: Physician Assistant

## 2023-11-15 ENCOUNTER — Ambulatory Visit: Payer: Self-pay | Admitting: Family Medicine

## 2023-11-15 ENCOUNTER — Other Ambulatory Visit (INDEPENDENT_AMBULATORY_CARE_PROVIDER_SITE_OTHER): Payer: Self-pay

## 2023-11-15 ENCOUNTER — Encounter: Payer: Self-pay | Admitting: Physician Assistant

## 2023-11-15 DIAGNOSIS — S92335A Nondisplaced fracture of third metatarsal bone, left foot, initial encounter for closed fracture: Secondary | ICD-10-CM

## 2023-11-15 NOTE — Progress Notes (Signed)
 Office Visit Note   Patient: [name]           Date of Birth: Oct 06, 1926           MRN: [id number] Visit Date: 11/15/2023              Requested by: Almarie Waddell NOVAK, NP [address] [place],  [place] [address] PCP: Almarie Waddell NOVAK, NP   Assessment & Plan: Visit Diagnoses:  1. Nondisplaced fracture of third metatarsal bone, left foot, initial encounter for closed fracture     Plan: Impression is 8 weeks status post left foot proximal third metatarsal fracture.  Patient is clinically healed.  She will continue to advance with activity as tolerated.  Follow-up with us  as needed.  Call with concerns or questions.  Follow-Up Instructions: Return if symptoms worsen or fail to improve.   Orders:  Orders Placed This Encounter  Procedures   XR Foot Complete Left   No orders of the defined types were placed in this encounter.     Procedures: No procedures performed   Clinical Data: No additional findings.   Subjective: Chief Complaint  Patient presents with   Left Foot - Follow-up    3rd metatarsal fracture    HPI patient is a pleasant 89 year old female who comes in today approximately 8 weeks status post left foot proximal third metatarsal fracture.  She is doing much better.  She still has occasional bad days but overall doing well.  She is back to wearing regular shoes without any issues.  She takes Tylenol  as needed.  Review of Systems as detailed in HPI.  All others reviewed and are negative.   Objective: Vital Signs: LMP  (LMP Unknown)   Physical Exam well-developed well-nourished female no acute distress.  Alert and oriented x 3.  Ortho Exam left foot exam: Mild swelling.  No tenderness at the fracture site.  Painless range of motion.  She is neurovascular intact distally.  Specialty Comments:  No specialty comments available.  Imaging: XR Foot Complete Left Result Date: 11/15/2023 X-rays demonstrate abundant callus formation  fracture site    PMFS History: Patient Active Problem List   Diagnosis Date Noted   Vitamin D  deficiency 02/17/2023   High risk medication use 03/25/2022   Bilateral sensorineural hearing loss 12/29/2021   Anxiety 11/24/2021   Aortic valve sclerosis 02/24/2021   Varicose veins of both lower extremities 02/24/2021   Left knee pain 04/28/2015   Stage 3a chronic kidney disease (HCC) 12/04/2014   Coronary artery disease involving native coronary artery of native heart without angina pectoris 11/22/2013   Essential hypertension 11/22/2013   Pure hypercholesterolemia 11/22/2013   Inversion, nipple 09/05/2013   Shingles    Elevated cholesterol    Right knee pain 05/10/2011   Past Medical History:  Diagnosis Date   Aortic valve sclerosis    CAD (coronary artery disease)    S/p DES to the proximal RCA, POBA to the distal RCA 8/08 //Myoview  2009 low risk // Myoview  9/22: EF 57, no ischemia or infarction, apical thinning artifact, low risk study   Cancer (HCC)    UTERINE cancer,   2001 LAVH with BSO     DUB (dysfunctional uterine bleeding)    Elevated cholesterol    Atorvastatin    GERD (gastroesophageal reflux disease)    History of echocardiogram    EF 60-65, GRII DD, GLS -18.4, normal RVSF, moderate MAC, moderate AV calcification, trivial AI   Hypertension  Plantar fasciitis    Shingles     Family History  Problem Relation Age of Onset   Heart attack Mother    Hypertension Mother    Heart attack Father    CAD Father    Heart disease Brother    Hypertension Brother    Diabetes Brother    COPD Brother    Asthma Brother    Breast cancer Neg Hx     Past Surgical History:  Procedure Laterality Date   APPENDECTOMY  2011   CARDIAC STINT IMPLANT  2008   CARPAL TUNNEL RELEASE     EYE SURGERY     INGUINAL HERNIA REPAIR     OOPHORECTOMY  2001   LAVH,BSO   ULNA NERVE RELEASE  2009   VAGINAL HYSTERECTOMY  2001   LAVH, BSO   Social History   Occupational History   Not  on file  Tobacco Use   Smoking status: Former   Smokeless tobacco: Never  Vaping Use   Vaping status: Never Used  Substance and Sexual Activity   Alcohol  use: No    Alcohol /week: 0.0 standard drinks of alcohol    Drug use: No   Sexual activity: Never    Birth control/protection: Post-menopausal, Surgical    Comment: INTERCOURSE AGE 45, SEXUAL PARTNERS LEWSS THAN 5

## 2023-12-15 ENCOUNTER — Other Ambulatory Visit (HOSPITAL_BASED_OUTPATIENT_CLINIC_OR_DEPARTMENT_OTHER): Payer: Self-pay | Admitting: Family Medicine

## 2023-12-15 DIAGNOSIS — Z139 Encounter for screening, unspecified: Secondary | ICD-10-CM

## 2023-12-31 ENCOUNTER — Emergency Department (HOSPITAL_BASED_OUTPATIENT_CLINIC_OR_DEPARTMENT_OTHER)
Admission: EM | Admit: 2023-12-31 | Discharge: 2024-01-01 | Disposition: A | Attending: Emergency Medicine | Admitting: Emergency Medicine

## 2023-12-31 ENCOUNTER — Encounter (HOSPITAL_BASED_OUTPATIENT_CLINIC_OR_DEPARTMENT_OTHER): Payer: Self-pay | Admitting: *Deleted

## 2023-12-31 ENCOUNTER — Other Ambulatory Visit: Payer: Self-pay

## 2023-12-31 DIAGNOSIS — W01198A Fall on same level from slipping, tripping and stumbling with subsequent striking against other object, initial encounter: Secondary | ICD-10-CM | POA: Insufficient documentation

## 2023-12-31 DIAGNOSIS — I491 Atrial premature depolarization: Secondary | ICD-10-CM | POA: Insufficient documentation

## 2023-12-31 DIAGNOSIS — I517 Cardiomegaly: Secondary | ICD-10-CM | POA: Diagnosis not present

## 2023-12-31 DIAGNOSIS — Z79899 Other long term (current) drug therapy: Secondary | ICD-10-CM | POA: Insufficient documentation

## 2023-12-31 DIAGNOSIS — E871 Hypo-osmolality and hyponatremia: Secondary | ICD-10-CM | POA: Diagnosis not present

## 2023-12-31 DIAGNOSIS — W19XXXA Unspecified fall, initial encounter: Secondary | ICD-10-CM

## 2023-12-31 DIAGNOSIS — Z7982 Long term (current) use of aspirin: Secondary | ICD-10-CM | POA: Diagnosis not present

## 2023-12-31 DIAGNOSIS — R011 Cardiac murmur, unspecified: Secondary | ICD-10-CM | POA: Diagnosis not present

## 2023-12-31 DIAGNOSIS — S0990XA Unspecified injury of head, initial encounter: Secondary | ICD-10-CM | POA: Insufficient documentation

## 2023-12-31 LAB — CBC WITH DIFFERENTIAL/PLATELET
Abs Immature Granulocytes: 0.07 K/uL (ref 0.00–0.07)
Basophils Absolute: 0 K/uL (ref 0.0–0.1)
Basophils Relative: 1 %
Eosinophils Absolute: 0.2 K/uL (ref 0.0–0.5)
Eosinophils Relative: 2 %
HCT: 32 % — ABNORMAL LOW (ref 36.0–46.0)
Hemoglobin: 10.9 g/dL — ABNORMAL LOW (ref 12.0–15.0)
Immature Granulocytes: 1 %
Lymphocytes Relative: 16 %
Lymphs Abs: 1.4 K/uL (ref 0.7–4.0)
MCH: 29.3 pg (ref 26.0–34.0)
MCHC: 34.1 g/dL (ref 30.0–36.0)
MCV: 86 fL (ref 80.0–100.0)
Monocytes Absolute: 1 K/uL (ref 0.1–1.0)
Monocytes Relative: 11 %
Neutro Abs: 6 K/uL (ref 1.7–7.7)
Neutrophils Relative %: 69 %
Platelets: 240 K/uL (ref 150–400)
RBC: 3.72 MIL/uL — ABNORMAL LOW (ref 3.87–5.11)
RDW: 15.7 % — ABNORMAL HIGH (ref 11.5–15.5)
WBC: 8.6 K/uL (ref 4.0–10.5)
nRBC: 0 % (ref 0.0–0.2)

## 2023-12-31 NOTE — ED Triage Notes (Signed)
 Pt brought by [employer]. Pt states taking something to garage it fel, kneel to pick up and fell backward hitting head. Pt states lost balance. Denies LOC. Pt is A&O. No blood thinner.

## 2024-01-01 ENCOUNTER — Emergency Department (HOSPITAL_BASED_OUTPATIENT_CLINIC_OR_DEPARTMENT_OTHER)

## 2024-01-01 LAB — PRO BRAIN NATRIURETIC PEPTIDE: Pro Brain Natriuretic Peptide: 376 pg/mL — ABNORMAL HIGH (ref <300.0)

## 2024-01-01 LAB — COMPREHENSIVE METABOLIC PANEL WITH GFR
ALT: 19 U/L (ref 0–44)
AST: 30 U/L (ref 15–41)
Albumin: 4 g/dL (ref 3.5–5.0)
Alkaline Phosphatase: 89 U/L (ref 38–126)
Anion gap: 12 (ref 5–15)
BUN: 17 mg/dL (ref 8–23)
CO2: 22 mmol/L (ref 22–32)
Calcium: 8.9 mg/dL (ref 8.9–10.3)
Chloride: 92 mmol/L — ABNORMAL LOW (ref 98–111)
Creatinine, Ser: 0.83 mg/dL (ref 0.44–1.00)
GFR, Estimated: >60 mL/min (ref >60)
Glucose, Bld: 110 mg/dL — ABNORMAL HIGH (ref 70–99)
Potassium: 4 mmol/L (ref 3.5–5.1)
Sodium: 127 mmol/L — ABNORMAL LOW (ref 135–145)
Total Bilirubin: 0.6 mg/dL (ref 0.0–1.2)
Total Protein: 6.2 g/dL — ABNORMAL LOW (ref 6.5–8.1)

## 2024-01-01 LAB — TROPONIN T, HIGH SENSITIVITY: Troponin T High Sensitivity: 20 ng/L — ABNORMAL HIGH (ref 0–19)

## 2024-01-01 MED ORDER — LIDOCAINE-EPINEPHRINE-TETRACAINE (LET) TOPICAL GEL
3.0000 mL | Freq: Once | TOPICAL | Status: AC
  Administered 2024-01-01: 3 mL via TOPICAL
  Filled 2024-01-01: qty 3

## 2024-01-01 MED ORDER — SODIUM CHLORIDE 0.9 % IV BOLUS
500.0000 mL | Freq: Once | INTRAVENOUS | Status: AC
  Administered 2024-01-01: 500 mL via INTRAVENOUS

## 2024-01-01 MED ORDER — ACETAMINOPHEN 325 MG PO TABS
650.0000 mg | ORAL_TABLET | Freq: Once | ORAL | Status: AC
  Administered 2024-01-01: 650 mg via ORAL
  Filled 2024-01-01: qty 2

## 2024-01-01 NOTE — ED Notes (Signed)
 Patient left her watch in the room.  This RN called and left a message stating that it was left behind and it will be waiting at the security office in a labelled jar.

## 2024-01-01 NOTE — ED Provider Notes (Signed)
 [place] [facility] Provider Note   CSN: [id number] Arrival date & time: 12/31/23  2308     Patient presents with: Fall (w/ head injury)   [name] is a 89 y.o. female.   89 year old female who presents with a fall due to balance issues. She reports that she lost her balance and fell backward, hitting the back of her head, resulting in a large bruise but no significant laceration. The patient is on a daily low-dose aspirin but denies the use of other blood thinners. She has experienced balance issues for several years and uses a cane for ambulation. She lives alone in a townhouse and has not been feeling well since starting antibiotics for a recent gum infection. She took the antibiotic this morning. The patient has a known heart murmur, discovered during a colonoscopy, but denies any chest pain or shortness of breath. She reports her legs are usually swollen by the end of the day. She denies any recent illness aside from the gum infection and has not been able to walk since the fall. History was obtained from the patient.   Fall       Prior to Admission medications   Medication Sig Start Date End Date Taking? Authorizing Provider  amLODipine  (NORVASC ) 5 MG tablet Take 1 tablet (5 mg total) by mouth daily. 07/28/23   Almarie Waddell NOVAK, NP  Ascorbic Acid (VITAMIN C) 1000 MG tablet Take 1,000 mg by mouth daily.    [provider]  aspirin 81 MG tablet Take 81 mg by mouth daily.      [provider]  atorvastatin  (LIPITOR) 40 MG tablet Take 1 tablet (40 mg total) by mouth daily. 07/28/23   Almarie Waddell NOVAK, NP  cetirizine (ZYRTEC) 10 MG tablet Take 10 mg by mouth daily.      [provider]  Cholecalciferol (VITAMIN D ) 2000 units CAPS Take 2,000 Units by mouth daily.    [provider]  clorazepate  (TRANXENE ) 3.75 MG tablet Take 1 tablet (3.75 mg total) by mouth 2 (two) times daily as needed for anxiety. 07/28/23    Almarie Waddell NOVAK, NP  Coenzyme Q10 (CO Q 10 PO) Take 1 tablet by mouth every morning.    [provider]  diclofenac  Sodium (VOLTAREN ) 1 % GEL Apply 2 g topically 4 (four) times daily. 10/31/23   Almarie Waddell NOVAK, NP  lisinopril  (ZESTRIL ) 40 MG tablet Take 1 tablet (40 mg total) by mouth daily. 07/28/23   Almarie Waddell NOVAK, NP  nitroGLYCERIN  (NITROSTAT ) 0.4 MG SL tablet Place 1 tablet (0.4 mg total) under the tongue every 5 (five) minutes as needed for chest pain. 05/30/18 10/31/23  Army Katheryn BROCKS, NP  pantoprazole  (PROTONIX ) 40 MG tablet Take 1 tablet (40 mg total) by mouth daily. 06/29/23   Almarie Waddell NOVAK, NP    Allergies: Patient has no known allergies.    Review of Systems  Updated Vital Signs BP (!) 172/64   Pulse 82   Temp (!) 97.5 F (36.4 C) (Oral)   Resp 20   Ht 5' 3 (1.6 m)   Wt 68 kg   LMP  (LMP Unknown)   SpO2 96%   BMI 26.57 kg/m   Physical Exam Vitals and nursing note reviewed.  Constitutional:      Appearance: She is well-developed.  HENT:     Head: Normocephalic and atraumatic.  Cardiovascular:     Rate and Rhythm: Normal rate and regular rhythm.  Heart sounds: Murmur heard.  Pulmonary:     Effort: No respiratory distress.     Breath sounds: No stridor.  Abdominal:     General: There is no distension.  Musculoskeletal:     Cervical back: Normal range of motion.  Neurological:     Mental Status: She is alert. Mental status is at baseline.     Comments: Mild tremor     (all labs ordered are listed, but only abnormal results are displayed) Labs Reviewed  CBC WITH DIFFERENTIAL/PLATELET - Abnormal; Notable for the following components:      Result Value   RBC 3.72 (*)    Hemoglobin 10.9 (*)    HCT 32.0 (*)    RDW 15.7 (*)    All other components within normal limits  COMPREHENSIVE METABOLIC PANEL WITH GFR - Abnormal; Notable for the following components:   Sodium 127 (*)    Chloride 92 (*)    Glucose, Bld 110 (*)    Total Protein 6.2 (*)     All other components within normal limits  PRO BRAIN NATRIURETIC PEPTIDE - Abnormal; Notable for the following components:   Pro Brain Natriuretic Peptide 376.0 (*)    All other components within normal limits  TROPONIN T, HIGH SENSITIVITY - Abnormal; Notable for the following components:   Troponin T High Sensitivity 20 (*)    All other components within normal limits    EKG: EKG Interpretation Date/Time:  Sunday January 01 2024 00:01:23 EDT Ventricular Rate:  79 PR Interval:  197 QRS Duration:  106 QT Interval:  379 QTC Calculation: 435 R Axis:   114  Text Interpretation: Sinus rhythm Atrial premature complex Right atrial enlargement Right axis deviation Artifact in lead(s) I II III aVR aVL aVF V1 Confirmed by Lorette Mayo [id number]) on 01/01/2024 12:40:49 AM  Radiology: [facility] Chest 2 View Result Date: 01/01/2024 EXAM: 2 VIEW(S) XRAY OF THE CHEST 01/01/2024 12:50:00 AM COMPARISON: 12 / 15 / 13 CLINICAL HISTORY: eval for fall. Patient lost balance and fell backwards in the garage. Denies LOC. Lac to posterior skull. No chest complaints. FINDINGS: LUNGS AND PLEURA: No focal pulmonary opacity. No pulmonary edema. No pleural effusion. No pneumothorax. HEART AND MEDIASTINUM: Cardiomegaly. Aortic arch calcifications. BONES AND SOFT TISSUES: Multilevel degenerative changes of thoracic spine. No acute osseous abnormality. UPPER ABDOMEN: Moderate to large hiatal hernia. IMPRESSION: 1. No acute cardiopulmonary process. 2. Cardiomegaly. Electronically signed by: Norman Gatlin MD 01/01/2024 01:00 AM EDT RP Workstation: [id number]   CT Head Wo Contrast Result Date: 01/01/2024 EXAM: CT HEAD WITHOUT CONTRAST 01/01/2024 12:38:00 AM TECHNIQUE: CT of the head was performed without the administration of intravenous contrast. Automated exposure control, iterative reconstruction, and/or weight based adjustment of the mA/kV was utilized to reduce the radiation dose to as low as reasonably achievable. COMPARISON:  None available. CLINICAL HISTORY: Head trauma, minor (Age >= 65y).n FINDINGS: BRAIN AND VENTRICLES: No acute hemorrhage. No evidence of acute infarct. No hydrocephalus. No extra-axial collection. No mass effect or midline shift. Patchy and confluent areas of decreased attenuation are noted throughout the deep and periventricular white matter of the cerebral hemispheres bilaterally suggestive of chronic microvascular ischemic changes. Cerebral and cerebellar mild volume loss. Atherosclerotic calcifications are present within the cavernous internal carotid and vertebral arteries. ORBITS: No acute abnormality. SINUSES: No acute abnormality. SOFT TISSUES AND SKULL: Slightly right of midline occipital scalp hematoma measuring up to 15 mm. No skull fracture. IMPRESSION: 1. No acute intracranial abnormality. 2. Slightly right of midline occipital scalp  hematoma measuring up to 15 mm. Electronically signed by: Morgane Naveau MD 01/01/2024 12:47 AM EDT RP Workstation: [id number]     Procedures   Medications Ordered in the ED  sodium chloride  0.9 % bolus 500 mL (0 mLs Intravenous Stopped 01/01/24 0301)  lidocaine -EPINEPHrine-tetracaine (LET) topical gel (3 mLs Topical Given 01/01/24 0206)  acetaminophen  (TYLENOL ) tablet 650 mg (650 mg Oral Given 01/01/24 0203)                                    Medical Decision Making Amount and/or Complexity of Data Reviewed Labs: ordered. Radiology: ordered. ECG/medicine tests: ordered.  Risk OTC drugs.   The patient presented to the ED after experiencing a fall, reporting an inability to stand up due to balance issues, which they attribute to being old and possibly inebriated. The patient hit the back of their head during the fall, resulting in a bruise but no significant laceration or bleeding. The patient is on aspirin and has a history of balance issues for several years, typically using a cane for mobility. They live alone in a townhouse. The patient recently  had a gum infection and has been on antibiotics, which they feel have made them feel unwell. They report chronic leg swelling and have been informed of a heart murmur, which was discovered during a colonoscopy. The patient denies chest pain, shortness of breath, or dizziness, but reports feeling shivery. They have not walked since the fall and report no hip pain or other significant injuries.  Traumatic workup reassuring. Wound care per nursing. Found to have mild hyponatremia, NaCl given and will increase salt intake at home and recheck. This is likely the cause for the tremors and probably made her balance worse as well. TOC/CM ordered and face to face orders placed to try and get PT/OT at home to see if there is anything they can do to help out. otherwise stable for d/c.    Final diagnoses:  Fall, initial encounter  Injury of head, initial encounter  Hyponatremia    ED Discharge Orders          Ordered    Home Health        01/01/24 0304    Face-to-face encounter (required for Medicare/Medicaid patients)       Comments: I Selinda Sias certify that this patient is under my care and that I, or a nurse practitioner or physician's assistant working with me, had a face-to-face encounter that meets the physician face-to-face encounter requirements with this patient on 01/01/2024. The encounter with the patient was in whole, or in part for the following medical condition(s) which is the primary reason for home health care (List medical condition): falls secondary to balance and hyponatremia. Further orders per PCP.   01/01/24 0304               Perian Tedder, Selinda, MD 01/01/24 [id number]

## 2024-01-02 ENCOUNTER — Other Ambulatory Visit: Payer: Self-pay | Admitting: Family Medicine

## 2024-01-02 ENCOUNTER — Encounter (HOSPITAL_BASED_OUTPATIENT_CLINIC_OR_DEPARTMENT_OTHER): Payer: Self-pay | Admitting: Emergency Medicine

## 2024-01-02 ENCOUNTER — Other Ambulatory Visit: Payer: Self-pay

## 2024-01-02 ENCOUNTER — Ambulatory Visit: Payer: Self-pay

## 2024-01-02 ENCOUNTER — Emergency Department (HOSPITAL_BASED_OUTPATIENT_CLINIC_OR_DEPARTMENT_OTHER)
Admission: EM | Admit: 2024-01-02 | Discharge: 2024-01-02 | Disposition: A | Discharge status: Home / Self Care | Attending: Emergency Medicine | Admitting: Emergency Medicine

## 2024-01-02 DIAGNOSIS — R251 Tremor, unspecified: Secondary | ICD-10-CM | POA: Insufficient documentation

## 2024-01-02 DIAGNOSIS — W01198A Fall on same level from slipping, tripping and stumbling with subsequent striking against other object, initial encounter: Secondary | ICD-10-CM | POA: Diagnosis not present

## 2024-01-02 DIAGNOSIS — Z7982 Long term (current) use of aspirin: Secondary | ICD-10-CM | POA: Insufficient documentation

## 2024-01-02 LAB — COMPREHENSIVE METABOLIC PANEL WITH GFR
ALT: 19 U/L (ref 0–44)
AST: 30 U/L (ref 15–41)
Albumin: 4.1 g/dL (ref 3.5–5.0)
Alkaline Phosphatase: 91 U/L (ref 38–126)
Anion gap: 12 (ref 5–15)
BUN: 15 mg/dL (ref 8–23)
CO2: 23 mmol/L (ref 22–32)
Calcium: 9 mg/dL (ref 8.9–10.3)
Chloride: 96 mmol/L — ABNORMAL LOW (ref 98–111)
Creatinine, Ser: 0.79 mg/dL (ref 0.44–1.00)
GFR, Estimated: >60 mL/min (ref >60)
Glucose, Bld: 100 mg/dL — ABNORMAL HIGH (ref 70–99)
Potassium: 4.5 mmol/L (ref 3.5–5.1)
Sodium: 131 mmol/L — ABNORMAL LOW (ref 135–145)
Total Bilirubin: 0.6 mg/dL (ref 0.0–1.2)
Total Protein: 6.4 g/dL — ABNORMAL LOW (ref 6.5–8.1)

## 2024-01-02 LAB — URINALYSIS, MICROSCOPIC (REFLEX): RBC / HPF: NONE SEEN RBC/hpf (ref 0–5)

## 2024-01-02 LAB — CBC
HCT: 31.9 % — ABNORMAL LOW (ref 36.0–46.0)
Hemoglobin: 10.9 g/dL — ABNORMAL LOW (ref 12.0–15.0)
MCH: 29.5 pg (ref 26.0–34.0)
MCHC: 34.2 g/dL (ref 30.0–36.0)
MCV: 86.4 fL (ref 80.0–100.0)
Platelets: 226 K/uL (ref 150–400)
RBC: 3.69 MIL/uL — ABNORMAL LOW (ref 3.87–5.11)
RDW: 15.8 % — ABNORMAL HIGH (ref 11.5–15.5)
WBC: 9.1 K/uL (ref 4.0–10.5)
nRBC: 0 % (ref 0.0–0.2)

## 2024-01-02 LAB — URINALYSIS, ROUTINE W REFLEX MICROSCOPIC
Bilirubin Urine: NEGATIVE
Glucose, UA: NEGATIVE mg/dL
Hgb urine dipstick: NEGATIVE
Ketones, ur: NEGATIVE mg/dL
Nitrite: NEGATIVE
Protein, ur: NEGATIVE mg/dL
Specific Gravity, Urine: 1.02 (ref 1.005–1.030)
pH: 7 (ref 5.0–8.0)

## 2024-01-02 LAB — TROPONIN T, HIGH SENSITIVITY: Troponin T High Sensitivity: 19 ng/L (ref 0–19)

## 2024-01-02 LAB — MAGNESIUM: Magnesium: 1.8 mg/dL (ref 1.7–2.4)

## 2024-01-02 MED ORDER — LACTATED RINGERS IV BOLUS
500.0000 mL | Freq: Once | INTRAVENOUS | Status: AC
  Administered 2024-01-02: 500 mL via INTRAVENOUS

## 2024-01-02 NOTE — Discharge Instructions (Signed)
 Follow-up with your primary care provider for recheck of your sodium.  Follow-up with them for reevaluation of your tremor and we are also referring you to neurology.  If you develop new or worsening weakness or any other new/concerning symptoms then return to the ER.

## 2024-01-02 NOTE — Telephone Encounter (Signed)
 FYI Only or Action Required?: FYI only for provider.  Patient was last seen in primary care on 10/31/2023 by Almarie Waddell NOVAK, NP.  Called Nurse Triage reporting Shaking.  Symptoms began today.  Interventions attempted: Nothing.  Symptoms are: unchanged.  Triage Disposition: Go to ED Now (or PCP Triage)  Patient/caregiver understands and will follow disposition?: Yes  Copied from CRM #[id number]. Topic: Clinical - Red Word Triage >> Jan 02, 2024  4:40 PM Donee H wrote: [facility] that prompted transfer to Nurse Triage: Patient's daughter Rock Dustman calling with concern of patient. She stating patient went to Er over weekend and it was told to them she had low sodium. She has been ok for fluids since discharged but begin shaking today. She doesn't know what to do.  ED for fall, r/t low sodium Reason for Disposition  [1] New-onset muscle jerks (twitches, spasms) AND [2] episode lasts > 1 minute AND [3] resolved  Answer Assessment - Initial Assessment Questions No available appts today. Advised ED.  Seen in ED 12/30/23, for fall r/t low sodium.   1. APPEARANCE of MOVEMENT: What did the jerking or twitching look like? (e.g., body area)     Legs jerking, holds out hand trembling 2. ONSET: When did this start happening? (e.g., hours, days, weeks, months ago)     Started again an hour ago 3. DURATION: How long does the jerk, twitch, or spasm last?     Last hour  6. CAUSE: What do you think caused the ?     hyponatremia 7. OTHER SYMPTOMS: Are there any other symptoms? (e.g., fever, headache)     Denies HA, dizziness, numbness, sob, problems walking/talking, bowel/bladder Alert and oritent, walks with walker  Protocols used: Muscle Jerks - Tics - [facility]

## 2024-01-02 NOTE — ED Triage Notes (Signed)
 Pt with daughter- daughter reports tremors x 2 hours. Denies known fever.   Pt c/o generalized fatigue.  Reports good po intake.

## 2024-01-02 NOTE — ED Notes (Signed)
 Pt transferred from [facility] to ED RM 4. Assuming pt care at this time.

## 2024-01-02 NOTE — ED Notes (Signed)
 Called lab to run magnesium and troponin off of blood samples sent to lab previously. Per lab, they will put this into process now.

## 2024-01-02 NOTE — ED Provider Notes (Signed)
 [place] [facility] Provider Note   CSN: [id number] Arrival date & time: 01/02/24  1732     Patient presents with: Tremors   [name] is a 89 y.o. female.   HPI 89 year old female presents with a chief complaint of tremors.  The patient has noticed tremors on and off for the past few days.  2 days ago she fell and struck her head.  She was seen in this ED and had a negative workup.  She has been feeling weaker than normal over these last couple days since the fall and has been requiring assistance to get up out of bed and then using a walker, when she would normally use a cane.  She was attributing this to low sodium that was found in the ED.  She was feeling better after some IV fluids.  She denies any specific dysuria.  No chest pain, headache, focal weakness.  Prior to Admission medications   Medication Sig Start Date End Date Taking? Authorizing Provider  amLODipine  (NORVASC ) 5 MG tablet Take 1 tablet (5 mg total) by mouth daily. 07/28/23   Almarie Waddell NOVAK, NP  Ascorbic Acid (VITAMIN C) 1000 MG tablet Take 1,000 mg by mouth daily.    [provider]  aspirin 81 MG tablet Take 81 mg by mouth daily.      [provider]  atorvastatin  (LIPITOR) 40 MG tablet Take 1 tablet (40 mg total) by mouth daily. 07/28/23   Almarie Waddell NOVAK, NP  cetirizine (ZYRTEC) 10 MG tablet Take 10 mg by mouth daily.      [provider]  Cholecalciferol (VITAMIN D ) 2000 units CAPS Take 2,000 Units by mouth daily.    [provider]  clorazepate  (TRANXENE ) 3.75 MG tablet Take 1 tablet (3.75 mg total) by mouth 2 (two) times daily as needed for anxiety. 07/28/23   Almarie Waddell NOVAK, NP  Coenzyme Q10 (CO Q 10 PO) Take 1 tablet by mouth every morning.    [provider]  diclofenac  Sodium (VOLTAREN ) 1 % GEL Apply 2 g topically 4 (four) times daily. 10/31/23   Almarie Waddell NOVAK, NP  lisinopril  (ZESTRIL ) 40 MG tablet Take 1 tablet (40 mg total)  by mouth daily. 07/28/23   Almarie Waddell NOVAK, NP  nitroGLYCERIN  (NITROSTAT ) 0.4 MG SL tablet Place 1 tablet (0.4 mg total) under the tongue every 5 (five) minutes as needed for chest pain. 05/30/18 10/31/23  Army Katheryn BROCKS, NP  pantoprazole  (PROTONIX ) 40 MG tablet TAKE ONE TABLET BY MOUTH EVERY DAY 01/02/24   Almarie Waddell NOVAK, NP    Allergies: Patient has no known allergies.    Review of Systems  Constitutional:  Negative for fever.  Respiratory:  Negative for shortness of breath.   Cardiovascular:  Negative for chest pain.  Gastrointestinal:  Negative for abdominal pain, diarrhea and vomiting.  Genitourinary:  Negative for dysuria.  Neurological:  Positive for tremors and weakness. Negative for headaches.    Updated Vital Signs BP (!) 166/79 (BP Location: Right Arm)   Pulse 82   Temp 98.4 F (36.9 C) (Oral)   Resp 18   Ht 5' 3 (1.6 m)   Wt 68 kg   LMP  (LMP Unknown)   SpO2 96%   BMI 26.56 kg/m   Physical Exam Vitals and nursing note reviewed.  Constitutional:      General: She is not in acute distress.    Appearance: She is well-developed. She is not ill-appearing  or diaphoretic.  HENT:     Head: Normocephalic and atraumatic.  Eyes:     Extraocular Movements: Extraocular movements intact.     Pupils: Pupils are equal, round, and reactive to light.  Cardiovascular:     Rate and Rhythm: Normal rate and regular rhythm.     Heart sounds: Murmur heard.  Pulmonary:     Effort: Pulmonary effort is normal.     Breath sounds: Normal breath sounds.  Abdominal:     Palpations: Abdomen is soft.     Tenderness: There is no abdominal tenderness.  Musculoskeletal:     Right hip: Normal range of motion.     Left hip: Normal range of motion.     Right lower leg: No edema.     Left lower leg: No edema.  Skin:    General: Skin is warm and dry.  Neurological:     Mental Status: She is alert.     Comments: CN 3-12 grossly intact. 5/5 strength in all 4 extremities. Grossly normal  sensation. Normal finger to nose.  There is a fine tremor when she holds both of her arms up.  I did occasionally see a resting tremor, primarily left hand.  No rigidity appreciated.     (all labs ordered are listed, but only abnormal results are displayed) Labs Reviewed  COMPREHENSIVE METABOLIC PANEL WITH GFR - Abnormal; Notable for the following components:      Result Value   Sodium 131 (*)    Chloride 96 (*)    Glucose, Bld 100 (*)    Total Protein 6.4 (*)    All other components within normal limits  CBC - Abnormal; Notable for the following components:   RBC 3.69 (*)    Hemoglobin 10.9 (*)    HCT 31.9 (*)    RDW 15.8 (*)    All other components within normal limits  URINALYSIS, ROUTINE W REFLEX MICROSCOPIC - Abnormal; Notable for the following components:   Leukocytes,Ua TRACE (*)    All other components within normal limits  URINALYSIS, MICROSCOPIC (REFLEX) - Abnormal; Notable for the following components:   Bacteria, UA RARE (*)    All other components within normal limits  MAGNESIUM  TROPONIN T, HIGH SENSITIVITY    EKG: EKG Interpretation Date/Time:  Monday January 02 2024 19:14:00 EDT Ventricular Rate:  83 PR Interval:  172 QRS Duration:  106 QT Interval:  371 QTC Calculation: 436 R Axis:   112  Text Interpretation: Sinus rhythm Low voltage, precordial leads Probable right ventricular hypertrophy no significant change since yesterday Confirmed by Freddi Hamilton [phone]) on 01/02/2024 7:18:30 PM  Radiology: DG Chest 2 View Result Date: 01/01/2024 EXAM: 2 VIEW(S) XRAY OF THE CHEST 01/01/2024 12:50:00 AM COMPARISON: 12 / 15 / 13 CLINICAL HISTORY: eval for fall. Patient lost balance and fell backwards in the garage. Denies LOC. Lac to posterior skull. No chest complaints. FINDINGS: LUNGS AND PLEURA: No focal pulmonary opacity. No pulmonary edema. No pleural effusion. No pneumothorax. HEART AND MEDIASTINUM: Cardiomegaly. Aortic arch calcifications. BONES AND SOFT  TISSUES: Multilevel degenerative changes of thoracic spine. No acute osseous abnormality. UPPER ABDOMEN: Moderate to large hiatal hernia. IMPRESSION: 1. No acute cardiopulmonary process. 2. Cardiomegaly. Electronically signed by: Norman Gatlin MD 01/01/2024 01:00 AM EDT RP Workstation: [id number]   CT Head Wo Contrast Result Date: 01/01/2024 EXAM: CT HEAD WITHOUT CONTRAST 01/01/2024 12:38:00 AM TECHNIQUE: CT of the head was performed without the administration of intravenous contrast. Automated exposure control, iterative reconstruction, and/or weight  based adjustment of the mA/kV was utilized to reduce the radiation dose to as low as reasonably achievable. COMPARISON: None available. CLINICAL HISTORY: Head trauma, minor (Age >= 65y).n FINDINGS: BRAIN AND VENTRICLES: No acute hemorrhage. No evidence of acute infarct. No hydrocephalus. No extra-axial collection. No mass effect or midline shift. Patchy and confluent areas of decreased attenuation are noted throughout the deep and periventricular white matter of the cerebral hemispheres bilaterally suggestive of chronic microvascular ischemic changes. Cerebral and cerebellar mild volume loss. Atherosclerotic calcifications are present within the cavernous internal carotid and vertebral arteries. ORBITS: No acute abnormality. SINUSES: No acute abnormality. SOFT TISSUES AND SKULL: Slightly right of midline occipital scalp hematoma measuring up to 15 mm. No skull fracture. IMPRESSION: 1. No acute intracranial abnormality. 2. Slightly right of midline occipital scalp hematoma measuring up to 15 mm. Electronically signed by: Morgane Naveau MD 01/01/2024 12:47 AM EDT RP Workstation: [id number]     Procedures   Medications Ordered in the ED  lactated ringers bolus 500 mL (500 mLs Intravenous New Bag/Given 01/02/24 1920)                                    Medical Decision Making Amount and/or Complexity of Data Reviewed External Data Reviewed: notes. Labs:  ordered.    Details: Sodium improved at 131.  Stable anemia at 10.9. ECG/medicine tests: ordered and independent interpretation performed.    Details: No ischemia   Patient presents with generalized weakness.  No clear cause based on workup and examination.  Hemoglobin is stable and she reports no obvious blood loss.  Sodium is improving.  Unclear cause of the tremor, it sounds like it was actually ongoing before her fall a couple days ago.  There is no obvious rigidity on exam.  She is freely moving all extremities and seems to have intact strength when in the bed.  I did offer we could consider admission given she is having a hard time walking and is requiring new assistance at home.  However she declines this.  Home health has been ordered, it appears it was most likely ordered during her last visit but to ensure home health will order again.  Otherwise, urine is not consistent with a UTI.  No urinary symptoms.  Normal WBC.  Troponin of 19 is similar to 20 that she had during her visit 36 hours ago.  At this point, I think she is stable for discharge and she already has PCP follow-up and will also give neurology follow-up given the tremor.  I do not think reimaging her brain would be helpful.  I do not think she needs an emergent MRI.  Will discharge home with return precautions.  She has family they can stay with her and help her.     Final diagnoses:  Tremor    ED Discharge Orders          Ordered    Home Health        01/02/24 1942    Face-to-face encounter (required for Medicare/Medicaid patients)       Comments: I Glendia ONEIDA Breeding certify that this patient is under my care and that I, or a nurse practitioner or physician's assistant working with me, had a face-to-face encounter that meets the physician face-to-face encounter requirements with this patient on 01/02/2024. The encounter with the patient was in whole, or in part for the following medical condition(s) which is the  primary  reason for home health care (List medical condition): weakness   01/02/24 1942    Ambulatory referral to Neurology       Comments: An appointment is requested in approximately: 2 weeks   01/02/24 1943               Freddi Hamilton, MD 01/02/24 [phone]

## 2024-01-03 NOTE — Telephone Encounter (Signed)
 Pt seen at ED

## 2024-01-05 ENCOUNTER — Ambulatory Visit: Admitting: Neurology

## 2024-01-05 ENCOUNTER — Telehealth: Payer: Self-pay | Admitting: Neurology

## 2024-01-05 ENCOUNTER — Encounter: Payer: Self-pay | Admitting: Family Medicine

## 2024-01-05 ENCOUNTER — Ambulatory Visit: Payer: Self-pay | Admitting: Family Medicine

## 2024-01-05 ENCOUNTER — Telehealth: Payer: Self-pay | Admitting: Family Medicine

## 2024-01-05 ENCOUNTER — Ambulatory Visit: Admitting: Family Medicine

## 2024-01-05 VITALS — BP 114/70 | HR 80 | Ht 63.0 in

## 2024-01-05 DIAGNOSIS — R251 Tremor, unspecified: Secondary | ICD-10-CM

## 2024-01-05 DIAGNOSIS — R2689 Other abnormalities of gait and mobility: Secondary | ICD-10-CM

## 2024-01-05 DIAGNOSIS — D649 Anemia, unspecified: Secondary | ICD-10-CM | POA: Diagnosis not present

## 2024-01-05 DIAGNOSIS — E871 Hypo-osmolality and hyponatremia: Secondary | ICD-10-CM | POA: Diagnosis not present

## 2024-01-05 DIAGNOSIS — R110 Nausea: Secondary | ICD-10-CM

## 2024-01-05 DIAGNOSIS — R531 Weakness: Secondary | ICD-10-CM

## 2024-01-05 DIAGNOSIS — W19XXXA Unspecified fall, initial encounter: Secondary | ICD-10-CM

## 2024-01-05 LAB — CBC WITH DIFFERENTIAL/PLATELET
Basophils Absolute: 0.1 K/uL (ref 0.0–0.1)
Basophils Relative: 0.9 % (ref 0.0–3.0)
Eosinophils Absolute: 0.3 K/uL (ref 0.0–0.7)
Eosinophils Relative: 4.3 % (ref 0.0–5.0)
HCT: 35.5 % — ABNORMAL LOW (ref 36.0–46.0)
Hemoglobin: 11.7 g/dL — ABNORMAL LOW (ref 12.0–15.0)
Lymphocytes Relative: 18.5 % (ref 12.0–46.0)
Lymphs Abs: 1.5 K/uL (ref 0.7–4.0)
MCHC: 32.9 g/dL (ref 30.0–36.0)
MCV: 89.3 fl (ref 78.0–100.0)
Monocytes Absolute: 0.8 K/uL (ref 0.1–1.0)
Monocytes Relative: 9.7 % (ref 3.0–12.0)
Neutro Abs: 5.3 K/uL (ref 1.4–7.7)
Neutrophils Relative %: 66.6 % (ref 43.0–77.0)
Platelets: 268 K/uL (ref 150.0–400.0)
RBC: 3.97 Mil/uL (ref 3.87–5.11)
RDW: 16.3 % — ABNORMAL HIGH (ref 11.5–15.5)
WBC: 8 K/uL (ref 4.0–10.5)

## 2024-01-05 LAB — IBC + FERRITIN
Ferritin: 21 ng/mL (ref 10.0–291.0)
Iron: 109 ug/dL (ref 42–145)
Saturation Ratios: 25.5 % (ref 20.0–50.0)
TIBC: 427 ug/dL (ref 250.0–450.0)
Transferrin: 305 mg/dL (ref 212.0–360.0)

## 2024-01-05 LAB — BASIC METABOLIC PANEL WITH GFR
BUN: 18 mg/dL (ref 6–23)
CO2: 25 meq/L (ref 19–32)
Calcium: 8.9 mg/dL (ref 8.4–10.5)
Chloride: 96 meq/L (ref 96–112)
Creatinine, Ser: 0.76 mg/dL (ref 0.40–1.20)
GFR: 66.71 mL/min (ref >60.00)
Glucose, Bld: 90 mg/dL (ref 70–99)
Potassium: 4.2 meq/L (ref 3.5–5.1)
Sodium: 130 meq/L — ABNORMAL LOW (ref 135–145)

## 2024-01-05 NOTE — Telephone Encounter (Signed)
 Copied from CRM [id number]. Topic: General - Other >> Jan 05, 2024  2:17 PM Sasha M wrote: Reason for CRM: Pt called in to let Waddell Mon know that the ER doctor did not request an assessment for a home visit and pt also wanted to know if Waddell thinks she should go through with the neurology appt. Please call pt to advise

## 2024-01-05 NOTE — Progress Notes (Signed)
 Acute Office Visit - ED follow-up.   Subjective:     Patient ID: [name], female    DOB: 1926/11/09, 89 y.o.   MRN: [id number]  Chief Complaint  Patient presents with   Medical Management of Chronic Issues    HPI Patient is in today for ED follow-up, hyponatremia.   Discussed the use of AI scribe software for clinical note transcription with the patient, who gave verbal consent to proceed.  History of Present Illness [name] is a 89 year old female who presents with tremors and weakness following a fall.  She experienced a fall on October 18th, during which she fell backwards and hit her head. She was evaluated in the emergency department where a full workup was conducted, revealing low sodium levels. She received fluids as part of her treatment.  On October 20th, she returned to the emergency department due to tremors. The tremors began after the fall and initially affected her whole body, described as a 'gentle shake'. The tremors have subsided over the past two days. She is scheduled to see a neurologist next week for further evaluation.  She feels weak and off balance, and has been using a walker for stability. She has started consuming electrolyte drinks like Pedialyte.  She has a history of low hemoglobin, with a recent level of 10.9, and has experienced low iron levels in the past. She does not consume much beef and her appetite has decreased. No bleeding or other symptoms that might suggest a source of blood loss.  She lives alone but has a daughter who lives nearby. During the fall, she was able to call for help using her watch.   No chest pain, trouble breathing, dizziness, lightheadedness, or shortness of breath. She reports feeling 'a little wobbly' and unsteady.     ROS All review of systems negative except what is listed in the HPI      Objective:    BP 114/70   Pulse 80   Ht 5' 3 (1.6 m)   LMP  (LMP Unknown)   SpO2 99%   BMI 26.56  kg/m    Physical Exam Vitals reviewed.  Constitutional:      Appearance: Normal appearance.  Cardiovascular:     Rate and Rhythm: Normal rate and regular rhythm.     Heart sounds: Murmur heard.  Pulmonary:     Effort: Pulmonary effort is normal.     Breath sounds: Normal breath sounds.  Skin:    General: Skin is warm and dry.  Neurological:     Mental Status: She is alert and oriented to person, place, and time.     Comments: Equal grip strength, no tremor  Psychiatric:        Mood and Affect: Mood normal.        Behavior: Behavior normal.        Thought Content: Thought content normal.        Judgment: Judgment normal.          No results found for any visits on 01/05/24.      Assessment & Plan:   Problem List Items Addressed This Visit   None Visit Diagnoses       Hyponatremia    -  Primary   Relevant Orders   Basic metabolic panel with GFR     Anemia, unspecified type       Relevant Orders   CBC with Differential/Platelet   IBC + Ferritin  Tremor       Relevant Orders   CBC with Differential/Platelet   Basic metabolic panel with GFR     Balance problems           Assessment & Plan Hyponatremia, Tremor, Balance problem Sodium imbalance likely contributed to post-fall tremors, now resolved. Weakness and imbalance persist, likely due to electrolyte imbalance and fall. Neurology evaluation scheduled for next week. - Recheck electrolyte levels today. - Ensure adequate electrolyte consumption, recommend drinks like Gatorade or Pedialyte. - Follow up with neurology appointment next week. - Ensure home health physical therapy and occupational therapy evaluation is completed - they will call today and check. Can re-send order if needed. - Encourage use of a walker for stability until physical therapy evaluation.   Anemia Hemoglobin slightly low at 10.9. No bleeding reported. Will check iron levels to assess need for supplementation. - Check iron  levels. - If iron levels are low, consider starting iron supplementation. - Encourage dietary intake of iron-rich foods such as red meat, beans, and leafy greens.   General Health Maintenance 89 years old, lives alone. Discussed safety and monitoring importance due to recent fall. - Encourage use of a walker for stability until physical therapy evaluation.    No orders of the defined types were placed in this encounter.   Return if symptoms worsen or fail to improve, for  / keep next follow-up as scheduled.  Waddell KATHEE Mon, NP

## 2024-01-05 NOTE — Telephone Encounter (Signed)
 LVM and sent mychart msg informing pt of need to reschedule 01/05/24 appt - MD out

## 2024-01-05 NOTE — Telephone Encounter (Signed)
 Order placed for [facility] PT. Yes, I think she should still see neurology for evaluation.

## 2024-01-06 NOTE — Telephone Encounter (Signed)
 Patient made aware of recommendations.

## 2024-01-09 ENCOUNTER — Telehealth: Payer: Self-pay | Admitting: Neurology

## 2024-01-09 NOTE — Telephone Encounter (Signed)
 Copied from CRM [id number]. Topic: Clinical - Home Health Verbal Orders >> Jan 09, 2024  1:39 PM Dedra NOVAK wrote: Caller/Agency: Marcey from [facility] Callback Number: [phone] Service Requested: Physical Therapy and an evaluation for Occupational Therapy  Frequency: PT 1 wk 1, 2 wk 2, 1 wk 3 Any new concerns about the patient? No

## 2024-01-09 NOTE — Telephone Encounter (Signed)
 Twyla Kemps verbal orders for therapy.

## 2024-01-10 ENCOUNTER — Ambulatory Visit: Admitting: Neurology

## 2024-01-13 ENCOUNTER — Ambulatory Visit: Payer: Self-pay

## 2024-01-13 MED ORDER — ONDANSETRON 4 MG PO TBDP: 4.0000 mg | ORAL_TABLET | Freq: Four times a day (QID) | ORAL | 0 refills | Status: AC | PRN

## 2024-01-13 NOTE — Telephone Encounter (Signed)
 FYI Only or Action Required?: Action required by provider: clinical question for provider and refused acute appointment.  Patient was last seen in primary care on 01/05/2024 by Almarie Waddell NOVAK, NP.  Called Nurse Triage reporting Nausea.  Symptoms began several weeks ago.  Interventions attempted: Rest, hydration, or home remedies and Dietary changes.  Symptoms are: unchanged.  Triage Disposition: See PCP When Office is Open (Within 3 Days)  Patient/caregiver understands and will follow disposition?: No, wishes to speak with PCP     Copied from CRM #[id number]. Topic: Clinical - Red Word Triage >> Jan 13, 2024 10:09 AM Thersia BROCKS wrote: [facility] that prompted transfer to Nurse Triage: Patient called in stated she wanted for provider to prescribe medication, stated she is nausea, patient also stated that she fell but has already talked to someone regarding that Reason for Disposition  Nausea lasts > 1 week  Answer Assessment - Initial Assessment Questions Additional info:  1) Offered acute visit for evaluation but she declines due to unable to drive/transportation issue and starting physical therapy today s/p fall two weeks ago.  2) Patient shares she always gets nauseous with increased anxiety but never treated her nausea, her appetite is poor recently due to nausea, requesting prescription for Zofran  as her daughter suggested.   1. NAUSEA SEVERITY: How bad is the nausea? (e.g., mild, moderate, severe; dehydration, weight loss)      Moderate causing her poor appetite.  2. ONSET: When did the nausea begin?     Few weeks ago just before her fall.  3. VOMITING: Any vomiting? If Yes, ask: How many times today?     no 4. RECURRENT SYMPTOM: Have you had nausea before? If Yes, ask: When was the last time? What happened that time?     Yes-when anxious 5. CAUSE: What do you think is causing the nausea?     Unsure, has some anxiety  Protocols used: Nausea-A-AH

## 2024-01-13 NOTE — Telephone Encounter (Signed)
 Pt.notified

## 2024-01-13 NOTE — Telephone Encounter (Signed)
 Added some Zofran  for PRN use.

## 2024-01-16 ENCOUNTER — Encounter: Payer: Self-pay | Admitting: Radiology

## 2024-01-23 ENCOUNTER — Ambulatory Visit (HOSPITAL_BASED_OUTPATIENT_CLINIC_OR_DEPARTMENT_OTHER)

## 2024-02-01 ENCOUNTER — Other Ambulatory Visit: Payer: Self-pay | Admitting: Family Medicine

## 2024-02-01 DIAGNOSIS — E7800 Pure hypercholesterolemia, unspecified: Secondary | ICD-10-CM

## 2024-02-06 ENCOUNTER — Telehealth: Payer: Self-pay | Admitting: Neurology

## 2024-02-06 DIAGNOSIS — E871 Hypo-osmolality and hyponatremia: Secondary | ICD-10-CM

## 2024-02-06 NOTE — Telephone Encounter (Signed)
 Yes that's fine

## 2024-02-06 NOTE — Telephone Encounter (Signed)
 Lab ordered, appt scheduled. Patient made aware.

## 2024-02-06 NOTE — Telephone Encounter (Signed)
 Okay to order?    Copied from CRM [id number]. Topic: Clinical - Request for Lab/Test Order >> Feb 06, 2024 11:04 AM [name] wrote: Reason for CRM: Pt requesting to have her sodium checked. Pt wanted me to let you know that she wants it done Wednesday after 11:30 AM

## 2024-02-08 ENCOUNTER — Other Ambulatory Visit (INDEPENDENT_AMBULATORY_CARE_PROVIDER_SITE_OTHER)

## 2024-02-08 DIAGNOSIS — E871 Hypo-osmolality and hyponatremia: Secondary | ICD-10-CM

## 2024-02-08 NOTE — Addendum Note (Signed)
 Addended by: TRUDY CURVIN RAMAN on: 02/08/2024 01:17 PM   Modules accepted: Orders

## 2024-02-09 LAB — BASIC METABOLIC PANEL WITH GFR
BUN: 20 mg/dL (ref 7–25)
CO2: 24 mmol/L (ref 20–32)
Calcium: 9.1 mg/dL (ref 8.6–10.4)
Chloride: 97 mmol/L — ABNORMAL LOW (ref 98–110)
Creat: 0.88 mg/dL (ref 0.60–0.95)
Glucose, Bld: 96 mg/dL (ref 65–99)
Potassium: 5 mmol/L (ref 3.5–5.3)
Sodium: 131 mmol/L — ABNORMAL LOW (ref 135–146)
eGFR: 60 mL/min/1.73m2 (ref >=60)

## 2024-02-13 ENCOUNTER — Ambulatory Visit: Payer: Self-pay | Admitting: Family Medicine

## 2024-02-13 DIAGNOSIS — E871 Hypo-osmolality and hyponatremia: Secondary | ICD-10-CM

## 2024-02-24 ENCOUNTER — Other Ambulatory Visit (INDEPENDENT_AMBULATORY_CARE_PROVIDER_SITE_OTHER)

## 2024-02-24 DIAGNOSIS — E871 Hypo-osmolality and hyponatremia: Secondary | ICD-10-CM

## 2024-02-25 LAB — BASIC METABOLIC PANEL WITH GFR
BUN: 19 mg/dL (ref 7–25)
CO2: 25 mmol/L (ref 20–32)
Calcium: 9.3 mg/dL (ref 8.6–10.4)
Chloride: 100 mmol/L (ref 98–110)
Creat: 0.8 mg/dL (ref 0.60–0.95)
Glucose, Bld: 91 mg/dL (ref 65–99)
Potassium: 4.8 mmol/L (ref 3.5–5.3)
Sodium: 134 mmol/L — ABNORMAL LOW (ref 135–146)
eGFR: 68 mL/min/1.73m2 (ref >=60)

## 2024-02-27 ENCOUNTER — Ambulatory Visit: Payer: Self-pay | Admitting: Family Medicine

## 2024-03-29 ENCOUNTER — Other Ambulatory Visit: Payer: Self-pay | Admitting: Family Medicine

## 2024-03-29 DIAGNOSIS — F419 Anxiety disorder, unspecified: Secondary | ICD-10-CM

## 2024-03-29 DIAGNOSIS — I10 Essential (primary) hypertension: Secondary | ICD-10-CM

## 2024-04-05 ENCOUNTER — Ambulatory Visit: Admitting: Physician Assistant

## 2024-04-05 DIAGNOSIS — M170 Bilateral primary osteoarthritis of knee: Secondary | ICD-10-CM

## 2024-04-05 MED ORDER — METHYLPREDNISOLONE ACETATE 40 MG/ML IJ SUSP
13.3300 mg | INTRAMUSCULAR | Status: AC | PRN
  Administered 2024-04-05: 13.33 mg via INTRA_ARTICULAR

## 2024-04-05 MED ORDER — LIDOCAINE HCL 1 % IJ SOLN
3.0000 mL | INTRAMUSCULAR | Status: AC | PRN
  Administered 2024-04-05: 3 mL

## 2024-04-05 MED ORDER — BUPIVACAINE HCL 0.25 % IJ SOLN
0.6600 mL | INTRAMUSCULAR | Status: AC | PRN
  Administered 2024-04-05: .66 mL via INTRA_ARTICULAR

## 2024-04-05 NOTE — Progress Notes (Signed)
 "  Office Visit Note   Patient: [name]           Date of Birth: [birth year]           MRN: [id number] Visit Date: 04/05/2024              Requested by: Almarie Waddell NOVAK, NP [address] [place],  [place] [address] PCP: Almarie Waddell NOVAK, NP   Assessment & Plan: Visit Diagnoses:  1. Bilateral primary osteoarthritis of knee     Plan: Impression is bilateral knee osteoarthritis.  Today, we discussed various treatment options to include repeat cortisone injections for which she would like to proceed.  She will follow-up with us  as needed.  Call with concerns or questions.  Follow-Up Instructions: Return if symptoms worsen or fail to improve.   Orders:  Orders Placed This Encounter  Procedures   Large Joint Inj: bilateral knee   No orders of the defined types were placed in this encounter.     Procedures: Large Joint Inj: bilateral knee on 04/05/2024 11:24 AM Indications: pain Details: 22 G needle, anterolateral approach Medications (Right): 0.66 mL bupivacaine  0.25 %; 3 mL lidocaine  1 %; 13.33 mg methylPREDNISolone  acetate 40 MG/ML Medications (Left): 0.66 mL bupivacaine  0.25 %; 3 mL lidocaine  1 %; 13.33 mg methylPREDNISolone  acetate 40 MG/ML      Clinical Data: No additional findings.   Subjective: Chief Complaint  Patient presents with   Left Knee - Pain, Injections   Right Knee - Pain, Injections    HPI patient is a pleasant 89 year old female who comes in today with recurrent bilateral knee pain.  History of bilateral knee osteoarthritis.  She has undergone cortisone injections in the past with good relief.  Her last injections were in August of this past year.  Her pain is recently returned.  She has pain primarily at night.  Requesting repeat cortisone injections today.  Review of Systems as detailed in HPI.  All others reviewed and are negative.   Objective: Vital Signs: LMP  (LMP Unknown)   Physical Exam well-developed well-nourished  female no acute distress.  Alert oriented x 3.  Ortho Exam bilateral knee exam: Small effusion.  Range of motion 0 to 100 degrees.  No joint line tenderness.  Moderate patellofemoral crepitus.  She is neurovascularly intact distally.  Specialty Comments:  No specialty comments available.  Imaging: No new imaging   PMFS History: Patient Active Problem List   Diagnosis Date Noted   Vitamin D  deficiency 02/17/2023   High risk medication use 03/25/2022   Bilateral sensorineural hearing loss 12/29/2021   Anxiety 11/24/2021   Aortic valve sclerosis 02/24/2021   Varicose veins of both lower extremities 02/24/2021   Left knee pain 04/28/2015   Stage 3a chronic kidney disease (HCC) 12/04/2014   Coronary artery disease involving native coronary artery of native heart without angina pectoris 11/22/2013   Essential hypertension 11/22/2013   Pure hypercholesterolemia 11/22/2013   Inversion, nipple 09/05/2013   Shingles    Elevated cholesterol    Right knee pain 05/10/2011   Past Medical History:  Diagnosis Date   Aortic valve sclerosis    CAD (coronary artery disease)    S/p DES to the proximal RCA, POBA to the distal RCA 8/08 //Myoview  2009 low risk // Myoview  9/22: EF 57, no ischemia or infarction, apical thinning artifact, low risk study   Cancer (HCC)    UTERINE cancer,   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 with BSO  DUB (dysfunctional uterine bleeding)    Elevated cholesterol    Atorvastatin    GERD (gastroesophageal reflux disease)    History of echocardiogram    EF 60-65, GRII DD, GLS -18.4, normal RVSF, moderate MAC, moderate AV calcification, trivial AI   Hypertension    Plantar fasciitis    Shingles     Family History  Problem Relation Age of Onset   Heart attack Mother    Hypertension Mother    Heart attack Father    CAD Father    Heart disease Brother    Hypertension Brother    Diabetes Brother    COPD Brother    Asthma Brother    Breast cancer Neg Hx     Past Surgical History:   Procedure Laterality Date   APPENDECTOMY  2011   CARDIAC STINT IMPLANT  2008   CARPAL TUNNEL RELEASE     EYE SURGERY     INGUINAL HERNIA REPAIR     OOPHORECTOMY  2001   LAVH,BSO   ULNA NERVE RELEASE  2009   VAGINAL HYSTERECTOMY  2001   LAVH, BSO   Social History   Occupational History   Not on file  Tobacco Use   Smoking status: Former   Smokeless tobacco: Never  Vaping Use   Vaping status: Never Used  Substance and Sexual Activity   Alcohol  use: No    Alcohol /week: 0.0 standard drinks of alcohol    Drug use: No   Sexual activity: Never    Birth control/protection: Post-menopausal, Surgical    Comment: INTERCOURSE AGE 14, SEXUAL PARTNERS LEWSS THAN 7        "

## 2024-04-06 ENCOUNTER — Ambulatory Visit: Attending: Cardiology | Admitting: Cardiology

## 2024-04-06 VITALS — BP 140/60 | HR 80 | Ht 62.0 in | Wt 159.0 lb

## 2024-04-06 DIAGNOSIS — I10 Essential (primary) hypertension: Secondary | ICD-10-CM | POA: Diagnosis not present

## 2024-04-06 DIAGNOSIS — I83893 Varicose veins of bilateral lower extremities with other complications: Secondary | ICD-10-CM | POA: Diagnosis not present

## 2024-04-06 DIAGNOSIS — I2510 Atherosclerotic heart disease of native coronary artery without angina pectoris: Secondary | ICD-10-CM

## 2024-04-06 NOTE — Patient Instructions (Signed)
 Medication Instructions:  Please discontinue your Amlodipine . Continue all other medications as listed.  *If you need a refill on your cardiac medications before your next appointment, please call your pharmacy*  Follow-Up: At [facility], you and your health needs are our priority.  As part of our continuing mission to provide you with exceptional heart care, our providers are all part of one team.  This team includes your primary Cardiologist (physician) and Advanced Practice Providers or APPs (Physician Assistants and Nurse Practitioners) who all work together to provide you with the care you need, when you need it.  Your next appointment:   6 month(s)  Provider:   Oneil [place], MD    We recommend signing up for the patient portal called MyChart.  Sign up information is provided on this After Visit Summary.  MyChart is used to connect with patients for Virtual Visits (Telemedicine).  Patients are able to view lab/test results, encounter notes, upcoming appointments, etc.  Non-urgent messages can be sent to your provider as well.   To learn more about what you can do with MyChart, go to [email or web address].

## 2024-04-06 NOTE — Progress Notes (Signed)
 " Cardiology Office Note:  .   Date:  04/06/2024  ID:  [name], DOB 10-22-1926, MRN [id number] PCP: Almarie Waddell NOVAK, NP  [facility] Providers Cardiologist:  Oneil [place], MD     History of Present Illness: [name]   Carlie Solorzano is a 89 y.o. female Discussed the use of AI scribe  History of Present Illness [name] is a 89 year old female with coronary artery disease who presents for follow-up. She is accompanied by her daughter, [name].  Lower extremity edema and weakness - Legs and feet are swollen; no longer wakes up with visible ankles - Experiences leg weakness - Elevates legs using a chair to manage swelling - Considering use of compression stockings or ACE bandages - Attributes symptoms to diet, age, heart issues, or medication - Concerned about swelling being related to heart condition  Fall and hyponatremia [place] on December 31, 2023, attributed to low sodium levels - Serum sodium was 127 on October 18, improved to 130 by October 23, and 134 by December 12 - Increased salt intake by consuming processed foods - Continues to drink approximately 64 ounces of water daily  Mobility and functional status - Previously walked 7,500 steps daily before fall, which she believes helped manage fluid retention - Currently uses walker and cane for mobility - Cautious about activity level  Scalp lesion - Scab on head from fall has not fully healed - Lesion appears improved but remains present  Gastroesophageal reflux disease - History of GERD - Currently taking Protonix   Cardiovascular medication - Taking Norvasc  and lisinopril  for heart condition      Studies Reviewed: .        Results Labs Serum sodium (02/24/2024): 134, increased from 130 on 01/05/2024 and 127 on 12/31/2023  Diagnostic Cardiac stress test (2022): Within normal limits Echocardiogram: Right-sided valvular regurgitation; cardiac pressures not significantly elevated Risk  Assessment/Calculations:           Physical Exam:   VS:  BP (!) 140/60 (BP Location: Left Arm, Patient Position: Sitting, Cuff Size: Normal)   Pulse 80   Ht 5' 2 (1.575 m)   Wt 159 lb (72.1 kg)   LMP  (LMP Unknown)   SpO2 98%   BMI 29.08 kg/m    Wt Readings from Last 3 Encounters:  04/06/24 159 lb (72.1 kg)  01/02/24 149 lb 14.6 oz (68 kg)  12/31/23 150 lb (68 kg)    GEN: Well nourished, well developed in no acute distress NECK: No JVD; No carotid bruits CARDIAC: RRR, no murmurs, no rubs, no gallops RESPIRATORY:  Clear to auscultation without rales, wheezing or rhonchi  ABDOMEN: Soft, non-tender, non-distended EXTREMITIES:  No edema; No deformity   ASSESSMENT AND PLAN: .    Assessment and Plan Assessment & Plan Coronary artery disease status post right coronary artery stent Coronary artery disease with right coronary artery stent placed in 2008. Previous stress test in 2022 was normal. No indication for invasive procedures at this time. - Continue current management without invasive procedures.  Chronic lower extremity edema due to venous insuff Chronic lower extremity edema likely multifactorial, including venous insuff, age-related changes, and possibly medication effects. Edema exacerbated by prolonged sitting and reduced mobility. Amlodipine  may contribute to edema. - Discontinued amlodipine . - Continue lisinopril . - Elevate legs using a chair with a leg rest. - Use ACE bandages to wrap legs to reduce edema. - Encourage walking with the use of a walker or cane to promote  circulation.  History of hyponatremia Hyponatremia with sodium levels as low as 127 mEq/L, likely related to excessive fluid intake and dietary sodium adjustments. Sodium levels have improved to 134 mEq/L as of December 12. - Limit fluid intake to 1 liter per day. - Avoiding HCTZ        Dispo: 1 yr  Signed, Oneil [place], MD  "

## 2024-04-13 MED ORDER — PANTOPRAZOLE SODIUM 40 MG PO TBEC: 40.0000 mg | DELAYED_RELEASE_TABLET | Freq: Every day | ORAL | 0 refills | Status: AC

## 2024-04-13 MED ORDER — CLORAZEPATE DIPOTASSIUM 3.75 MG PO TABS: 3.7500 mg | ORAL_TABLET | Freq: Two times a day (BID) | ORAL | 0 refills | Status: DC | PRN

## 2024-04-13 MED ORDER — CLORAZEPATE DIPOTASSIUM 3.75 MG PO TABS: 3.7500 mg | ORAL_TABLET | Freq: Two times a day (BID) | ORAL | 0 refills | Status: AC | PRN

## 2024-04-13 MED ORDER — LISINOPRIL 40 MG PO TABS: 40.0000 mg | ORAL_TABLET | Freq: Every day | ORAL | 0 refills | Status: AC

## 2024-04-13 NOTE — Telephone Encounter (Signed)
 Please sign clorazepate   Other medications sent.

## 2024-04-13 NOTE — Telephone Encounter (Signed)
 Copied from CRM #[id number]. Topic: Clinical - Medication Refill >> Apr 13, 2024  8:33 AM Kendralyn S wrote: Medication: pantoprazole  (PROTONIX ) 40 MG tablet clorazepate  (TRANXENE ) 3.75 MG tablet (Requesting 10 day )  Has the patient contacted their pharmacy? Yes (Agent: If no, request that the patient contact the pharmacy for the refill. If patient does not wish to contact the pharmacy document the reason why and proceed with request.) (Agent: If yes, when and what did the pharmacy advise?)  This is the patient's preferred pharmacy:    [facility] [address], [place] - [address] [place] [address] Phone: [phone] Fax: [phone]  Is this the correct pharmacy for this prescription? Yes If no, delete pharmacy and type the correct one.   Has the prescription been filled recently? No  Is the patient out of the medication? Yes  Has the patient been seen for an appointment in the last year OR does the patient have an upcoming appointment? Yes  Can we respond through MyChart? Yes  Agent: Please be advised that Rx refills may take up to 3 business days. We ask that you follow-up with your pharmacy. >> Apr 13, 2024  8:46 AM Alexandria E wrote: Patient forgot to add lisinopril  (ZESTRIL ) 40 MG tablet to that medication refill.

## 2024-04-18 ENCOUNTER — Ambulatory Visit: Admitting: Neurology

## 2024-05-02 ENCOUNTER — Ambulatory Visit: Admitting: Family Medicine

## 2024-07-13 ENCOUNTER — Ambulatory Visit: Admitting: Cardiology
